# Patient Record
Sex: Female | Born: 1955 | Race: Asian | Hispanic: No | Smoking: Never smoker
Health system: Southern US, Community
[De-identification: ages and names within clinical notes are randomized; demographics above are authoritative.]

## PROBLEM LIST (undated history)

## (undated) DIAGNOSIS — I219 Acute myocardial infarction, unspecified: Secondary | ICD-10-CM

## (undated) DIAGNOSIS — I10 Essential (primary) hypertension: Secondary | ICD-10-CM

## (undated) DIAGNOSIS — D649 Anemia, unspecified: Secondary | ICD-10-CM

## (undated) DIAGNOSIS — E1165 Type 2 diabetes mellitus with hyperglycemia: Secondary | ICD-10-CM

## (undated) DIAGNOSIS — E039 Hypothyroidism, unspecified: Secondary | ICD-10-CM

## (undated) DIAGNOSIS — E785 Hyperlipidemia, unspecified: Secondary | ICD-10-CM

## (undated) DIAGNOSIS — K219 Gastro-esophageal reflux disease without esophagitis: Secondary | ICD-10-CM

## (undated) DIAGNOSIS — IMO0002 Reserved for concepts with insufficient information to code with codable children: Secondary | ICD-10-CM

## (undated) DIAGNOSIS — I2510 Atherosclerotic heart disease of native coronary artery without angina pectoris: Secondary | ICD-10-CM

## (undated) HISTORY — DX: Anemia, unspecified: D64.9

## (undated) HISTORY — DX: Hypothyroidism, unspecified: E03.9

---

## 2009-08-14 DEATH — deceased

## 2013-05-14 ENCOUNTER — Inpatient Hospital Stay (HOSPITAL_COMMUNITY)
Admission: EM | Admit: 2013-05-14 | Discharge: 2013-05-17 | DRG: 247 | Disposition: A | Discharge status: Home / Self Care | Payer: Medicaid Other | Attending: Cardiology | Admitting: Cardiology

## 2013-05-14 ENCOUNTER — Encounter (HOSPITAL_COMMUNITY): Payer: Self-pay | Admitting: Emergency Medicine

## 2013-05-14 ENCOUNTER — Emergency Department (HOSPITAL_COMMUNITY): Payer: Medicaid Other

## 2013-05-14 DIAGNOSIS — E1165 Type 2 diabetes mellitus with hyperglycemia: Secondary | ICD-10-CM

## 2013-05-14 DIAGNOSIS — I2510 Atherosclerotic heart disease of native coronary artery without angina pectoris: Secondary | ICD-10-CM

## 2013-05-14 DIAGNOSIS — I959 Hypotension, unspecified: Secondary | ICD-10-CM | POA: Diagnosis not present

## 2013-05-14 DIAGNOSIS — IMO0002 Reserved for concepts with insufficient information to code with codable children: Secondary | ICD-10-CM

## 2013-05-14 DIAGNOSIS — I10 Essential (primary) hypertension: Secondary | ICD-10-CM | POA: Diagnosis present

## 2013-05-14 DIAGNOSIS — E871 Hypo-osmolality and hyponatremia: Secondary | ICD-10-CM | POA: Diagnosis present

## 2013-05-14 DIAGNOSIS — E785 Hyperlipidemia, unspecified: Secondary | ICD-10-CM

## 2013-05-14 DIAGNOSIS — IMO0001 Reserved for inherently not codable concepts without codable children: Secondary | ICD-10-CM | POA: Diagnosis present

## 2013-05-14 DIAGNOSIS — E119 Type 2 diabetes mellitus without complications: Secondary | ICD-10-CM

## 2013-05-14 DIAGNOSIS — I200 Unstable angina: Secondary | ICD-10-CM | POA: Insufficient documentation

## 2013-05-14 DIAGNOSIS — I2119 ST elevation (STEMI) myocardial infarction involving other coronary artery of inferior wall: Secondary | ICD-10-CM

## 2013-05-14 DIAGNOSIS — I213 ST elevation (STEMI) myocardial infarction of unspecified site: Secondary | ICD-10-CM

## 2013-05-14 DIAGNOSIS — R079 Chest pain, unspecified: Secondary | ICD-10-CM | POA: Insufficient documentation

## 2013-05-14 HISTORY — DX: Atherosclerotic heart disease of native coronary artery without angina pectoris: I25.10

## 2013-05-14 HISTORY — DX: Reserved for concepts with insufficient information to code with codable children: IMO0002

## 2013-05-14 HISTORY — DX: Type 2 diabetes mellitus with hyperglycemia: E11.65

## 2013-05-14 HISTORY — DX: Essential (primary) hypertension: I10

## 2013-05-14 HISTORY — DX: Hyperlipidemia, unspecified: E78.5

## 2013-05-14 LAB — CBC
HCT: 38.1 % (ref 36.0–46.0)
Hemoglobin: 13.5 g/dL (ref 12.0–15.0)
MCH: 29.4 pg (ref 26.0–34.0)
MCHC: 35.4 g/dL (ref 30.0–36.0)
MCV: 83 fL (ref 78.0–100.0)
Platelets: 267 10*3/uL (ref 150–400)
RBC: 4.59 MIL/uL (ref 3.87–5.11)
RDW: 12.3 % (ref 11.5–15.5)
WBC: 7.4 10*3/uL (ref 4.0–10.5)

## 2013-05-14 LAB — BASIC METABOLIC PANEL
CO2: 20 mEq/L (ref 19–32)
Calcium: 9.9 mg/dL (ref 8.4–10.5)
GFR calc non Af Amer: >90 mL/min (ref >90)
Glucose, Bld: 364 mg/dL — ABNORMAL HIGH (ref 70–99)
Sodium: 128 mEq/L — ABNORMAL LOW (ref 135–145)

## 2013-05-14 LAB — POCT I-STAT TROPONIN I: Troponin i, poc: 0.01 ng/mL (ref 0.00–0.08)

## 2013-05-14 MED ORDER — METOPROLOL TARTRATE 25 MG PO TABS
25.0000 mg | ORAL_TABLET | Freq: Two times a day (BID) | ORAL | Status: DC
  Administered 2013-05-14 – 2013-05-15 (×3): 25 mg via ORAL
  Filled 2013-05-14 (×5): qty 1

## 2013-05-14 MED ORDER — ASPIRIN 325 MG PO TABS
325.0000 mg | ORAL_TABLET | ORAL | Status: AC
  Administered 2013-05-14: 325 mg via ORAL
  Filled 2013-05-14: qty 1

## 2013-05-14 MED ORDER — SODIUM CHLORIDE 0.9 % IV SOLN
INTRAVENOUS | Status: DC
  Administered 2013-05-14: 23:00:00 via INTRAVENOUS

## 2013-05-14 MED ORDER — NITROGLYCERIN 2 % TD OINT
1.0000 [in_us] | TOPICAL_OINTMENT | Freq: Once | TRANSDERMAL | Status: AC
  Administered 2013-05-14: 1 [in_us] via TOPICAL
  Filled 2013-05-14: qty 30

## 2013-05-14 MED ORDER — ACETAMINOPHEN 325 MG PO TABS
650.0000 mg | ORAL_TABLET | Freq: Four times a day (QID) | ORAL | Status: DC | PRN
  Administered 2013-05-16: 650 mg via ORAL
  Filled 2013-05-14: qty 2

## 2013-05-14 MED ORDER — ASPIRIN 325 MG PO TABS: 325.0000 mg | ORAL_TABLET | Freq: Every day | ORAL | Status: DC

## 2013-05-14 MED ORDER — PANTOPRAZOLE SODIUM 40 MG PO TBEC
40.0000 mg | DELAYED_RELEASE_TABLET | Freq: Every day | ORAL | Status: DC
  Administered 2013-05-15 – 2013-05-17 (×3): 40 mg via ORAL
  Filled 2013-05-14 (×3): qty 1

## 2013-05-14 MED ORDER — MORPHINE SULFATE 2 MG/ML IJ SOLN
2.0000 mg | INTRAMUSCULAR | Status: DC | PRN
  Administered 2013-05-14: 2 mg via INTRAVENOUS
  Filled 2013-05-14: qty 1

## 2013-05-14 MED ORDER — ONDANSETRON HCL 4 MG/2ML IJ SOLN
4.0000 mg | Freq: Once | INTRAMUSCULAR | Status: DC
  Administered 2013-05-14: 4 mg via INTRAVENOUS
  Filled 2013-05-14: qty 2

## 2013-05-14 MED ORDER — ENOXAPARIN SODIUM 40 MG/0.4ML ~~LOC~~ SOLN
40.0000 mg | Freq: Every day | SUBCUTANEOUS | Status: DC
  Administered 2013-05-14: 40 mg via SUBCUTANEOUS
  Filled 2013-05-14: qty 0.4

## 2013-05-14 MED ORDER — INSULIN ASPART 100 UNIT/ML ~~LOC~~ SOLN
0.0000 [IU] | Freq: Three times a day (TID) | SUBCUTANEOUS | Status: DC
  Administered 2013-05-15: 5 [IU] via SUBCUTANEOUS
  Administered 2013-05-15: 7 [IU] via SUBCUTANEOUS
  Administered 2013-05-15: 3 [IU] via SUBCUTANEOUS
  Administered 2013-05-16: 7 [IU] via SUBCUTANEOUS
  Administered 2013-05-16: 2 [IU] via SUBCUTANEOUS
  Administered 2013-05-16: 5 [IU] via SUBCUTANEOUS
  Administered 2013-05-17 (×2): 3 [IU] via SUBCUTANEOUS

## 2013-05-14 MED ORDER — ONDANSETRON HCL 4 MG/2ML IJ SOLN: 4.0000 mg | Freq: Four times a day (QID) | INTRAMUSCULAR | Status: DC | PRN

## 2013-05-14 MED ORDER — NITROGLYCERIN 0.4 MG SL SUBL
0.4000 mg | SUBLINGUAL_TABLET | SUBLINGUAL | Status: DC | PRN
Start: 2013-05-14 — End: 2013-05-17
  Administered 2013-05-14 (×3): 0.4 mg via SUBLINGUAL
  Filled 2013-05-14 (×2): qty 25

## 2013-05-14 MED ORDER — MORPHINE SULFATE 4 MG/ML IJ SOLN
4.0000 mg | Freq: Once | INTRAMUSCULAR | Status: DC
  Administered 2013-05-14: 4 mg via INTRAVENOUS
  Filled 2013-05-14: qty 1

## 2013-05-14 NOTE — H&P (Addendum)
[facility] History and Physical  [name] [name] ZOX:[id number] DOB: 1955/07/10 DOA: 05/14/2013  Referring physician: EDP PCP: No primary provider on file.   Chief Complaint: chest pain  HPI: [name] [name] is a 57 y.o. female the past medical history of diabetes, hypertension has not seen a medical provider for several years presents to the ER today the above complaint. History is provided by her son, who assists with translation. She apparently had a brief episode of chest pain on the 25th with subsided and then again this evening. They described the chest pain as severe crushing, heaviness radiating to the neck and back of the head, associated with shortness of breath, nausea and vomiting. No fevers or chills no cough congestion. She was given morphine and sublingual nitroglycerin with improvement in her chest pain, this recurred again and was given another dose of the same.     Review of Systems: Positive bolded The patient denies anorexia, fever, weight loss,, vision loss, decreased hearing, hoarseness, chest pain, syncope, dyspnea on exertion, peripheral edema, balance deficits, hemoptysis, abdominal pain, melena, hematochezia, severe indigestion/heartburn, hematuria, incontinence, genital sores, muscle weakness, suspicious skin lesions, transient blindness, difficulty walking, depression, unusual weight change, abnormal bleeding, enlarged lymph nodes, angioedema, and breast masses.    Past Medical History  Diagnosis Date  . Diabetes mellitus without complication   . Hypertension    Past Surgical History  Procedure Laterality Date  . Cesarean section     Social History:  reports that she has never smoked. She has never used smokeless tobacco. She reports that she does not drink alcohol or use illicit drugs. Lives at home with her son and family  No Known Allergies  family history History of heart disease in her parents, she is unclear of her age at the onset of her  disease  Prior to Admission medications   Medication Sig Start Date End Date Taking? Authorizing Provider  dextromethorphan (DELSYM) 30 MG/5ML liquid Take 30 mg by mouth as needed for cough.   Yes Historical Provider, MD  glyBURIDE-metformin (GLUCOVANCE) 2.5-500 MG per tablet Take 1 tablet by mouth at bedtime.   Yes Historical Provider, MD  losartan-hydrochlorothiazide (HYZAAR) 100-12.5 MG per tablet Take 1 tablet by mouth daily.   Yes Historical Provider, MD   Physical Exam: Filed Vitals:   05/14/13 1957  BP: 188/92  Pulse: 92  Temp: 97.6 F (36.4 C)  Resp: 18     General: Alert awake oriented x3 uncomfortable appearing  HEENT: PERRLA, EOMI  CVS S1-S2 regular rate rhythm no murmurs rubs or gallops  Abdomen soft obese nontender nondistended with normal bowel sounds no organomegaly  Extremities no edema clubbing or cyanosis  Skin no rashes or skin breakdown  Psychiatric appropriate mood and affect  Neuro nonfocal   Labs on Admission:  Basic Metabolic Panel:  Recent Labs Lab 05/14/13 2015  NA 128*  K 3.6  CL 92*  CO2 20  GLUCOSE 364*  BUN 11  CREATININE 0.64  CALCIUM 9.9   Liver Function Tests: No results found for this basename: AST, ALT, ALKPHOS, BILITOT, PROT, ALBUMIN,  in the last 168 hours No results found for this basename: LIPASE, AMYLASE,  in the last 168 hours No results found for this basename: AMMONIA,  in the last 168 hours CBC:  Recent Labs Lab 05/14/13 2015  WBC 7.4  HGB 13.5  HCT 38.1  MCV 83.0  PLT 267   Cardiac Enzymes: No results found for this basename: CKTOTAL, CKMB, CKMBINDEX, TROPONINI,  in  the last 168 hours  BNP (last 3 results)  Recent Labs  05/14/13 2015  PROBNP 34.1   CBG: No results found for this basename: GLUCAP,  in the last 168 hours  Radiological Exams on Admission: Dg Chest Port 1 View  05/14/2013   CLINICAL DATA:  Chest pain.  Short of breath.  EXAM: PORTABLE CHEST - 1 VIEW  COMPARISON:  None.   FINDINGS: Upper normal heart size. Lungs are under aerated with bibasilar atelectasis. No pneumothorax.  IMPRESSION: Bibasilar atelectasis.   Electronically Signed   By: Maryclare Bean M.D.   On: 05/14/2013 20:58    EKG: Independently reviewed. NSR, t wave inversion in leads V2-V4  Assessment/Plan  1. Chest pain concerning for unstable angina -EKG with T wave changes in anterior leads, no prior EKG for comparison -Admit to telemetry bed -Aspirin 325 mg daily, nitro paste , add low-dose metoprolol -Cycle cardiac enzymes -Check 2-D echocardiogram -I requested cardiology consult, discussed with Cards fellow Dr.Aitsebaomo -Will likely need cardiac cath Versus stress test,   2. Diabetes mellitus -Hold oral hypoglycemics -Sliding scale insulin -Check HbA1c  3. Hypertension -hold antihypertensives for now  4. Hyponatremia -Likely induced by diuretic, hold HCTZ -Gently hydrate and monitor  DVT prophylaxis: Lovenox   Code Status: Full Code Family Communication: d/w son and son in law at bedside Disposition Plan: inpatient   Time spent:  [name] [name] [facility] Pager [phone]  If 7PM-7AM, please contact night-coverage [email or web address] Password [facility] 05/14/2013, 10:07 PM

## 2013-05-14 NOTE — ED Notes (Signed)
Pt speaks no English, her son-in-law is helping to answer questions. Pt states that she had chest pain on the 25th and then again today. States she has had SOB, dizziness, n/v, and fell forward when the pain initially started at 1845. Pt in NAD. Skin pwd.

## 2013-05-14 NOTE — ED Provider Notes (Signed)
CSN: [id number]     Arrival date & time 05/14/13  1948 History   First MD Initiated Contact with Patient 05/14/13 2011     Chief Complaint  Patient presents with  . Chest Pain   (Consider location/radiation/quality/duration/timing/severity/associated sxs/prior Treatment) HPI Comments: Patient presents to the ER for evaluation of chest pain. Patient had onset of pain approximately 2 hours ago. She was cooking, not exerting herself when the pain began. She feels short of breath with it. Patient also reports that she had pain 4 days ago that was similar. She never told anyone at that time. Patient has no cardiac history, but both her parents had coronary artery disease.  Patient is a 57 y.o. female presenting with chest pain.  Chest Pain Associated symptoms: shortness of breath     Past Medical History  Diagnosis Date  . Diabetes mellitus without complication   . Hypertension    Past Surgical History  Procedure Laterality Date  . Cesarean section     No family history on file. History  Substance Use Topics  . Smoking status: Never Smoker   . Smokeless tobacco: Never Used  . Alcohol Use: No   OB History   Grav Para Term Preterm Abortions TAB SAB Ect Mult Living                 Review of Systems  Respiratory: Positive for shortness of breath.   Cardiovascular: Positive for chest pain.  All other systems reviewed and are negative.    Allergies  Review of patient's allergies indicates no known allergies.  Home Medications  No current outpatient prescriptions on file. BP 188/92  Pulse 92  Temp(Src) 97.6 F (36.4 C) (Oral)  Resp 18  SpO2 100% Physical Exam  Constitutional: She is oriented to person, place, and time. She appears well-developed and well-nourished. No distress.  HENT:  Head: Normocephalic and atraumatic.  Right Ear: Hearing normal.  Left Ear: Hearing normal.  Nose: Nose normal.  Mouth/Throat: Oropharynx is clear and moist and mucous membranes are  normal.  Eyes: Conjunctivae and EOM are normal. Pupils are equal, round, and reactive to light.  Neck: Normal range of motion. Neck supple.  Cardiovascular: Regular rhythm, S1 normal and S2 normal.  Exam reveals no gallop and no friction rub.   No murmur heard. Pulmonary/Chest: Effort normal and breath sounds normal. No respiratory distress. She exhibits no tenderness.  Abdominal: Soft. Normal appearance and bowel sounds are normal. There is no hepatosplenomegaly. There is no tenderness. There is no rebound, no guarding, no tenderness at McBurney's point and negative Murphy's sign. No hernia.  Musculoskeletal: Normal range of motion.  Neurological: She is alert and oriented to person, place, and time. She has normal strength. No cranial nerve deficit or sensory deficit. Coordination normal. GCS eye subscore is 4. GCS verbal subscore is 5. GCS motor subscore is 6.  Skin: Skin is warm, dry and intact. No rash noted. No cyanosis.  Psychiatric: She has a normal mood and affect. Her speech is normal and behavior is normal. Thought content normal.    ED Course  Procedures (including critical care time) Labs Review Labs Reviewed  BASIC METABOLIC PANEL - Abnormal; Notable for the following:    Sodium 128 (*)    Chloride 92 (*)    Glucose, Bld 364 (*)    All other components within normal limits  HEMOGLOBIN A1C - Abnormal; Notable for the following:    Hemoglobin A1C 11.7 (*)    Mean Plasma Glucose  289 (*)    All other components within normal limits  GLUCOSE, CAPILLARY - Abnormal; Notable for the following:    Glucose-Capillary 324 (*)    All other components within normal limits  CBC - Abnormal; Notable for the following:    HCT 35.2 (*)    All other components within normal limits  BASIC METABOLIC PANEL - Abnormal; Notable for the following:    Sodium 131 (*)    Chloride 95 (*)    Glucose, Bld 286 (*)    All other components within normal limits  GLUCOSE, CAPILLARY - Abnormal; Notable  for the following:    Glucose-Capillary 348 (*)    All other components within normal limits  TROPONIN I - Abnormal; Notable for the following:    Troponin I >20.00 (*)    All other components within normal limits  BASIC METABOLIC PANEL - Abnormal; Notable for the following:    Sodium 132 (*)    Potassium 3.3 (*)    Glucose, Bld 339 (*)    All other components within normal limits  GLUCOSE, CAPILLARY - Abnormal; Notable for the following:    Glucose-Capillary 259 (*)    All other components within normal limits  GLUCOSE, CAPILLARY - Abnormal; Notable for the following:    Glucose-Capillary 306 (*)    All other components within normal limits  CBC - Abnormal; Notable for the following:    Hemoglobin 11.9 (*)    HCT 33.8 (*)    All other components within normal limits  GLUCOSE, CAPILLARY - Abnormal; Notable for the following:    Glucose-Capillary 227 (*)    All other components within normal limits  GLUCOSE, CAPILLARY - Abnormal; Notable for the following:    Glucose-Capillary 209 (*)    All other components within normal limits  GLUCOSE, CAPILLARY - Abnormal; Notable for the following:    Glucose-Capillary 185 (*)    All other components within normal limits  BASIC METABOLIC PANEL - Abnormal; Notable for the following:    Glucose, Bld 345 (*)    All other components within normal limits  GLUCOSE, CAPILLARY - Abnormal; Notable for the following:    Glucose-Capillary 347 (*)    All other components within normal limits  MRSA PCR SCREENING  MRSA PCR SCREENING  CBC  PRO B NATRIURETIC PEPTIDE  TROPONIN I  LIPID PANEL  TROPONIN I  TROPONIN I  BASIC METABOLIC PANEL  POCT I-STAT TROPONIN I   Imaging Review Dg Chest Port 1 View  05/14/2013   CLINICAL DATA:  Chest pain.  Short of breath.  EXAM: PORTABLE CHEST - 1 VIEW  COMPARISON:  None.  FINDINGS: Upper normal heart size. Lungs are under aerated with bibasilar atelectasis. No pneumothorax.  IMPRESSION: Bibasilar atelectasis.    Electronically Signed   By: Maryclare Bean M.D.   On: 05/14/2013 20:58    EKG Interpretation    Date/Time:  Saturday May 14 2013 19:57:11 EST Ventricular Rate:  93 PR Interval:    QRS Duration: 65 QT Interval:  342 QTC Calculation: 425 R Axis:   35 Text Interpretation:  Sinus rhythm Borderline low voltage, extremity leads Borderline repolarization abnormality No previous ECGs available Confirmed by POLLINA  MD, CHRISTOPHER ([id number]) on 05/14/2013 8:24:25 PM            MDM  Diagnosis: Chest Pain  Patient presented to the ER with complaints of chest pain. Her pain improved with nitroglycerin. Initial workup is negative, will require further cardiac eval/rule out.  Gilda Crease, MD 05/16/13 [phone]

## 2013-05-15 ENCOUNTER — Encounter (HOSPITAL_COMMUNITY)
Admission: EM | Disposition: A | Discharge status: Home / Self Care | Payer: Self-pay | Source: Home / Self Care | Attending: Cardiology

## 2013-05-15 ENCOUNTER — Ambulatory Visit (HOSPITAL_COMMUNITY): Admission: EM | Admit: 2013-05-15 | Payer: Self-pay | Source: Ambulatory Visit | Admitting: Cardiovascular Disease

## 2013-05-15 DIAGNOSIS — I2510 Atherosclerotic heart disease of native coronary artery without angina pectoris: Secondary | ICD-10-CM

## 2013-05-15 DIAGNOSIS — I2119 ST elevation (STEMI) myocardial infarction involving other coronary artery of inferior wall: Secondary | ICD-10-CM

## 2013-05-15 DIAGNOSIS — I059 Rheumatic mitral valve disease, unspecified: Secondary | ICD-10-CM

## 2013-05-15 HISTORY — PX: PERCUTANEOUS STENT INTERVENTION: SHX5500

## 2013-05-15 HISTORY — PX: LEFT HEART CATHETERIZATION WITH CORONARY ANGIOGRAM: SHX5451

## 2013-05-15 HISTORY — PX: LEFT HEART CATH: SHX5946

## 2013-05-15 LAB — BASIC METABOLIC PANEL
BUN: 9 mg/dL (ref 6–23)
BUN: 8 mg/dL (ref 6–23)
CO2: 21 mEq/L (ref 19–32)
CO2: 24 mEq/L (ref 19–32)
Calcium: 8.4 mg/dL (ref 8.4–10.5)
Chloride: 95 mEq/L — ABNORMAL LOW (ref 96–112)
Creatinine, Ser: 0.59 mg/dL (ref 0.50–1.10)
GFR calc Af Amer: >90 mL/min (ref >90)
GFR calc non Af Amer: >90 mL/min (ref >90)
GFR calc non Af Amer: >90 mL/min (ref >90)
Glucose, Bld: 286 mg/dL — ABNORMAL HIGH (ref 70–99)
Glucose, Bld: 339 mg/dL — ABNORMAL HIGH (ref 70–99)
Potassium: 4.2 mEq/L (ref 3.5–5.1)
Potassium: 3.3 mEq/L — ABNORMAL LOW (ref 3.5–5.1)
Sodium: 131 mEq/L — ABNORMAL LOW (ref 135–145)

## 2013-05-15 LAB — CBC
HCT: 35.2 % — ABNORMAL LOW (ref 36.0–46.0)
Hemoglobin: 12 g/dL (ref 12.0–15.0)
MCH: 28.4 pg (ref 26.0–34.0)
MCV: 83.2 fL (ref 78.0–100.0)
Platelets: 249 10*3/uL (ref 150–400)
RBC: 4.23 MIL/uL (ref 3.87–5.11)
WBC: 8.4 10*3/uL (ref 4.0–10.5)

## 2013-05-15 LAB — MRSA PCR SCREENING
MRSA by PCR: NEGATIVE
MRSA by PCR: NEGATIVE

## 2013-05-15 LAB — HEMOGLOBIN A1C
Hgb A1c MFr Bld: 11.7 % — ABNORMAL HIGH (ref <5.7)
Mean Plasma Glucose: 289 mg/dL — ABNORMAL HIGH (ref <117)

## 2013-05-15 LAB — TROPONIN I: Troponin I: <0.3 ng/mL

## 2013-05-15 LAB — GLUCOSE, CAPILLARY
Glucose-Capillary: 324 mg/dL — ABNORMAL HIGH (ref 70–99)
Glucose-Capillary: 348 mg/dL — ABNORMAL HIGH (ref 70–99)

## 2013-05-15 SURGERY — LEFT HEART CATHETERIZATION WITH CORONARY ANGIOGRAM

## 2013-05-15 MED ORDER — HEPARIN (PORCINE) IN NACL 2-0.9 UNIT/ML-% IJ SOLN
INTRAMUSCULAR | Status: AC
  Filled 2013-05-15: qty 1000

## 2013-05-15 MED ORDER — HEPARIN (PORCINE) IN NACL 100-0.45 UNIT/ML-% IJ SOLN
800.0000 [IU]/h | INTRAMUSCULAR | Status: DC
  Administered 2013-05-15: 800 [IU]/h via INTRAVENOUS
  Filled 2013-05-15: qty 250

## 2013-05-15 MED ORDER — VERAPAMIL HCL 2.5 MG/ML IV SOLN
INTRAVENOUS | Status: AC
  Filled 2013-05-15: qty 2

## 2013-05-15 MED ORDER — TICAGRELOR 90 MG PO TABS
ORAL_TABLET | ORAL | Status: AC
  Filled 2013-05-15: qty 2

## 2013-05-15 MED ORDER — POTASSIUM CHLORIDE CRYS ER 10 MEQ PO TBCR
EXTENDED_RELEASE_TABLET | ORAL | Status: AC
  Filled 2013-05-15: qty 4

## 2013-05-15 MED ORDER — LISINOPRIL 2.5 MG PO TABS
2.5000 mg | ORAL_TABLET | Freq: Every day | ORAL | Status: DC
  Administered 2013-05-15: 2.5 mg via ORAL
  Filled 2013-05-15 (×4): qty 1

## 2013-05-15 MED ORDER — SODIUM CHLORIDE 0.9 % IV SOLN
0.2500 mg/kg/h | INTRAVENOUS | Status: DC
  Administered 2013-05-15: 0.25 mg/kg/h via INTRAVENOUS

## 2013-05-15 MED ORDER — INFLUENZA VAC SPLIT QUAD 0.5 ML IM SUSP
0.5000 mL | INTRAMUSCULAR | Status: AC
  Administered 2013-05-16: 0.5 mL via INTRAMUSCULAR
  Filled 2013-05-15: qty 0.5

## 2013-05-15 MED ORDER — ATORVASTATIN CALCIUM 80 MG PO TABS
80.0000 mg | ORAL_TABLET | Freq: Every day | ORAL | Status: DC
  Administered 2013-05-15 – 2013-05-16 (×2): 80 mg via ORAL
  Filled 2013-05-15 (×3): qty 1

## 2013-05-15 MED ORDER — NITROGLYCERIN 0.2 MG/ML ON CALL CATH LAB
INTRAVENOUS | Status: AC
  Filled 2013-05-15: qty 1

## 2013-05-15 MED ORDER — LIDOCAINE HCL (PF) 1 % IJ SOLN
INTRAMUSCULAR | Status: AC
  Filled 2013-05-15: qty 30

## 2013-05-15 MED ORDER — INSULIN ASPART 100 UNIT/ML ~~LOC~~ SOLN
9.0000 [IU] | Freq: Once | SUBCUTANEOUS | Status: AC
  Administered 2013-05-15: 9 [IU] via SUBCUTANEOUS

## 2013-05-15 MED ORDER — PNEUMOCOCCAL VAC POLYVALENT 25 MCG/0.5ML IJ INJ
0.5000 mL | INJECTION | INTRAMUSCULAR | Status: AC
  Administered 2013-05-16: 0.5 mL via INTRAMUSCULAR
  Filled 2013-05-15: qty 0.5

## 2013-05-15 MED ORDER — SODIUM CHLORIDE 0.9 % IV SOLN
INTRAVENOUS | Status: AC
  Administered 2013-05-15 (×2): via INTRAVENOUS

## 2013-05-15 MED ORDER — NITROGLYCERIN IN D5W 200-5 MCG/ML-% IV SOLN
INTRAVENOUS | Status: AC
  Administered 2013-05-15: 50000 ug
  Filled 2013-05-15: qty 250

## 2013-05-15 MED ORDER — POTASSIUM CHLORIDE CRYS ER 20 MEQ PO TBCR
40.0000 meq | EXTENDED_RELEASE_TABLET | Freq: Once | ORAL | Status: AC
  Administered 2013-05-15: 40 meq via ORAL
  Filled 2013-05-15: qty 2

## 2013-05-15 MED ORDER — ASPIRIN 81 MG PO CHEW
81.0000 mg | CHEWABLE_TABLET | Freq: Every day | ORAL | Status: DC
  Administered 2013-05-15 – 2013-05-17 (×3): 81 mg via ORAL
  Filled 2013-05-15 (×3): qty 1

## 2013-05-15 MED ORDER — TICAGRELOR 90 MG PO TABS
90.0000 mg | ORAL_TABLET | Freq: Two times a day (BID) | ORAL | Status: DC
  Administered 2013-05-15 – 2013-05-17 (×5): 90 mg via ORAL
  Filled 2013-05-15 (×6): qty 1

## 2013-05-15 MED ORDER — BIVALIRUDIN 250 MG IV SOLR
INTRAVENOUS | Status: AC
  Filled 2013-05-15: qty 250

## 2013-05-15 MED ORDER — MORPHINE SULFATE 2 MG/ML IJ SOLN
2.0000 mg | INTRAMUSCULAR | Status: DC | PRN
  Filled 2013-05-15: qty 1

## 2013-05-15 MED ORDER — NITROGLYCERIN IN D5W 200-5 MCG/ML-% IV SOLN: 2.0000 ug/min | INTRAVENOUS | Status: DC

## 2013-05-15 MED ORDER — GLIPIZIDE 5 MG PO TABS
5.0000 mg | ORAL_TABLET | Freq: Two times a day (BID) | ORAL | Status: DC
  Administered 2013-05-15 (×2): 5 mg via ORAL
  Filled 2013-05-15 (×5): qty 1

## 2013-05-15 NOTE — Progress Notes (Signed)
Pt arrived to ICU room 1226 at 2303, denied Chest pain at original assessment. Lajuana Matte RN assumed care upon pt arrival. At 2330 pt complained of chest pain 9/10 pain, SL Nitro given per RN as well as Morphine 2 mg IVP. Pain remained uncontrolled. Cardiology consulted per Dr Jomarie Longs, however Cardiology MD had not seen pt yet. Dr. Jomarie Longs paged at 2340, Benedetto Coons NP paged at 2355. Cardiology Dr Katha Cabal at bedside 2400 to assess pt.

## 2013-05-15 NOTE — ED Provider Notes (Signed)
CSN: [id number]     Arrival date & time 05/14/13  1948 History   First MD Initiated Contact with Patient 05/14/13 2011     Chief Complaint  Patient presents with  . Chest Pain   (Consider location/radiation/quality/duration/timing/severity/associated sxs/prior Treatment) Patient is a 57 y.o. female presenting with chest pain. The history is provided by the EMS personnel.  Chest Pain Pain location:  Unable to specify Pain quality: pressure   Pain severity:  Mild Onset quality:  Gradual Timing:  Constant Progression:  Improving Chronicity:  New Relieved by:  Nitroglycerin Worsened by:  Nothing tried Ineffective treatments:  None tried Associated symptoms: nausea and vomiting   Associated symptoms: no abdominal pain, no back pain, no cough, no dizziness, no fatigue, no fever, no headache and no shortness of breath     Past Medical History  Diagnosis Date  . Diabetes mellitus without complication   . Hypertension    Past Surgical History  Procedure Laterality Date  . Cesarean section     No family history on file. History  Substance Use Topics  . Smoking status: Never Smoker   . Smokeless tobacco: Never Used  . Alcohol Use: No   OB History   Grav Para Term Preterm Abortions TAB SAB Ect Mult Living                 Review of Systems  Constitutional: Negative for fever and fatigue.  HENT: Negative for congestion and drooling.   Eyes: Negative for pain.  Respiratory: Negative for cough and shortness of breath.   Cardiovascular: Positive for chest pain.  Gastrointestinal: Positive for nausea and vomiting. Negative for abdominal pain and diarrhea.  Genitourinary: Negative for dysuria and hematuria.  Musculoskeletal: Negative for back pain, gait problem and neck pain.  Skin: Negative for color change.  Neurological: Negative for dizziness and headaches.  Hematological: Negative for adenopathy.  Psychiatric/Behavioral: Negative for behavioral problems.  All other  systems reviewed and are negative.    Allergies  Review of patient's allergies indicates no known allergies.  Home Medications   Current Outpatient Rx  Name  Route  Sig  Dispense  Refill  . dextromethorphan (DELSYM) 30 MG/5ML liquid   Oral   Take 30 mg by mouth as needed for cough.         . glyBURIDE-metformin (GLUCOVANCE) 2.5-500 MG per tablet   Oral   Take 1 tablet by mouth at bedtime.         Marland Kitchen losartan-hydrochlorothiazide (HYZAAR) 100-12.5 MG per tablet   Oral   Take 1 tablet by mouth daily.          BP 132/84  Pulse 91  Temp(Src) 97.5 F (36.4 C) (Oral)  Resp 15  Ht 4\' 10"  (1.473 m)  Wt 145 lb 11.6 oz (66.1 kg)  BMI 30.46 kg/m2  SpO2 100% Physical Exam  Nursing note and vitals reviewed. Constitutional: She is oriented to person, place, and time. She appears well-developed and well-nourished.  HENT:  Head: Normocephalic.  Mouth/Throat: Oropharynx is clear and moist. No oropharyngeal exudate.  Eyes: Conjunctivae and EOM are normal. Pupils are equal, round, and reactive to light.  Neck: Normal range of motion. Neck supple.  Cardiovascular: Normal rate, regular rhythm, normal heart sounds and intact distal pulses.  Exam reveals no gallop and no friction rub.   No murmur heard. Pulmonary/Chest: Effort normal and breath sounds normal. No respiratory distress. She has no wheezes.  Abdominal: Soft. Bowel sounds are normal. There is no tenderness.  There is no rebound and no guarding.  Musculoskeletal: Normal range of motion. She exhibits no edema and no tenderness.  Neurological: She is alert and oriented to person, place, and time.  Skin: Skin is warm and dry.  Psychiatric: She has a normal mood and affect. Her behavior is normal.    ED Course  Procedures (including critical care time) Labs Review Labs Reviewed  BASIC METABOLIC PANEL - Abnormal; Notable for the following:    Sodium 128 (*)    Chloride 92 (*)    Glucose, Bld 364 (*)    All other  components within normal limits  GLUCOSE, CAPILLARY - Abnormal; Notable for the following:    Glucose-Capillary 324 (*)    All other components within normal limits  MRSA PCR SCREENING  CBC  PRO B NATRIURETIC PEPTIDE  TROPONIN I  LIPID PANEL  TROPONIN I  TROPONIN I  BASIC METABOLIC PANEL  HEMOGLOBIN A1C  HEPARIN LEVEL (UNFRACTIONATED)  POCT I-STAT TROPONIN I   Imaging Review Dg Chest Port 1 View  05/14/2013   CLINICAL DATA:  Chest pain.  Short of breath.  EXAM: PORTABLE CHEST - 1 VIEW  COMPARISON:  None.  FINDINGS: Upper normal heart size. Lungs are under aerated with bibasilar atelectasis. No pneumothorax.  IMPRESSION: Bibasilar atelectasis.   Electronically Signed   By: Maryclare Bean M.D.   On: 05/14/2013 20:58    EKG Interpretation    Date/Time:  Saturday May 14 2013 19:57:11 EST Ventricular Rate:  93 PR Interval:    QRS Duration: 65 QT Interval:  342 QTC Calculation: 425 R Axis:   35 Text Interpretation:  Sinus rhythm Borderline low voltage, extremity leads Borderline repolarization abnormality No previous ECGs available Confirmed by POLLINA  MD, CHRISTOPHER ([id number]) on 05/14/2013 8:24:25 PM            MDM   1. Diabetes mellitus   2. HTN (hypertension)   3. Unstable angina   4. ST elevation myocardial infarction (STEMI) of inferior wall   5. STEMI (ST elevation myocardial infarction)    1:33 AM 57 y.o. female who presents with chest pain. Per report the patient began having chest pain at approximately 7 to 8 PM this evening and was seen at [facility] long. She was admitted to [facility] long ICU. Per EMS she was having nausea, vomiting, and worsening chest pain. She developed acute EKG changes with inferior ST elevation. She got a heparin bolus and has been transferred to [facility] to have an emergent cath. She is currently stable and will be closely monitored in the emergency department while we wait on the cath team to arrive.    Junius Argyle, MD 05/16/13  [phone]

## 2013-05-15 NOTE — Progress Notes (Signed)
Chaplain paged to code stemi. Patient taken to Cath Lab. Chaplain provided pastoral care and support to the patient's family members.   05/15/13 0107  Clinical Encounter Type  Visited With Family  Visit Type Initial;Code  Referral From Nurse

## 2013-05-15 NOTE — Progress Notes (Signed)
Utilization Review Completed.[name] [name] T11/30/2014  

## 2013-05-15 NOTE — Consult Note (Signed)
Reason for Consult: chest Pain  Referring Physician: Dr. Zannie Cove  [name] [name] is an 57 y.o. female who is currently seen in consultation with Dr. Zannie Cove for evaluation of chest pain. History was obtained from patient's son who speak fluent english  HPI: [name] [name] is a 57 y/o [place] female with a PMI of HTN and DM and presented with chest pain that started while she was cooking about 2 hours prior to presentation.  Her chest pain was described as a pressure pain, 9/10 in severity, associated with shortness of breath, diaphoresis and radiating to her neck. She has no history of tobacco use and no prior history of CAD or family history of premature CAD. She originally presented to [facility] where her initial cardiac markers were negative. EKG obtained 05/14/2013 at 19:57 showed sinus rhythm with heart rate of 93 bpm and T-wave inversion in leads V2-V5 (no old EKG for comparison).  She was initially admitted to [facility] ICU and started on Nitropaste and prn Morphine prior to obtaining a cardiology consult.   On my arrival to [facility] for cardiology consult, patient was complaining of 9/10 chest pain, and she appeared to be in distress. She was then started on Nitroglycerin drip 5mg /hr and titrated up to 10mg /hr, and she was started on Heparin 3000 unit bolus followed by a drip. Since she appeared to have ST-elevation on telemetry monitor at that time, a 12-lead EKG was obtained which confirmed presence of inferior St-elevation.  Dr. Kirke Corin (on call interventionalist) was notified and Carelink was activated and patient was emergently transferred to [facility] cardiac catheterization lab for a diagnostic procedure.  Past Medical History  Diagnosis Date  . Diabetes mellitus without complication   . Hypertension     Past Surgical History  Procedure Laterality Date  . Cesarean section      No family history on file.  Social History:  reports that she has  never smoked. She has never used smokeless tobacco. She reports that she does not drink alcohol or use illicit drugs.  Allergies: No Known Allergies  Medications: Noted  Results for orders placed during the hospital encounter of 05/14/13 (from the past 48 hour(s))  CBC     Status: None   Collection Time    05/14/13  8:15 PM      Result Value Range   WBC 7.4  4.0 - 10.5 K/uL   RBC 4.59  3.87 - 5.11 MIL/uL   Hemoglobin 13.5  12.0 - 15.0 g/dL   HCT 47.8  29.5 - 62.1 %   MCV 83.0  78.0 - 100.0 fL   MCH 29.4  26.0 - 34.0 pg   MCHC 35.4  30.0 - 36.0 g/dL   RDW 30.8  65.7 - 84.6 %   Platelets 267  150 - 400 K/uL  BASIC METABOLIC PANEL     Status: Abnormal   Collection Time    05/14/13  8:15 PM      Result Value Range   Sodium 128 (*) 135 - 145 mEq/L   Potassium 3.6  3.5 - 5.1 mEq/L   Chloride 92 (*) 96 - 112 mEq/L   CO2 20  19 - 32 mEq/L   Glucose, Bld 364 (*) 70 - 99 mg/dL   BUN 11  6 - 23 mg/dL   Creatinine, Ser 9.62  0.50 - 1.10 mg/dL   Calcium 9.9  8.4 - 95.2 mg/dL   GFR calc non Af Amer >90  >90  mL/min   GFR calc Af Amer >90  >90 mL/min   Comment: (NOTE)     The eGFR has been calculated using the CKD EPI equation.     This calculation has not been validated in all clinical situations.     eGFR's persistently <90 mL/min signify possible Chronic Kidney     Disease.  PRO B NATRIURETIC PEPTIDE     Status: None   Collection Time    05/14/13  8:15 PM      Result Value Range   Pro B Natriuretic peptide (BNP) 34.1  0 - 125 pg/mL  POCT I-STAT TROPONIN I     Status: None   Collection Time    05/14/13  8:20 PM      Result Value Range   Troponin i, poc 0.01  0.00 - 0.08 ng/mL   Comment 3            Comment: Due to the release kinetics of cTnI,     a negative result within the first hours     of the onset of symptoms does not rule out     myocardial infarction with certainty.     If myocardial infarction is still suspected,     repeat the test at appropriate intervals.   TROPONIN I     Status: None   Collection Time    05/14/13 11:36 PM      Result Value Range   Troponin I <0.30  <0.30 ng/mL   Comment:            Due to the release kinetics of cTnI,     a negative result within the first hours     of the onset of symptoms does not rule out     myocardial infarction with certainty.     If myocardial infarction is still suspected,     repeat the test at appropriate intervals.  GLUCOSE, CAPILLARY     Status: Abnormal   Collection Time    05/15/13 12:07 AM      Result Value Range   Glucose-Capillary 324 (*) 70 - 99 mg/dL   Comment 1 Notify RN      Dg Chest Port 1 View  05/14/2013   CLINICAL DATA:  Chest pain.  Short of breath.  EXAM: PORTABLE CHEST - 1 VIEW  COMPARISON:  None.  FINDINGS: Upper normal heart size. Lungs are under aerated with bibasilar atelectasis. No pneumothorax.  IMPRESSION: Bibasilar atelectasis.   Electronically Signed   By: Maryclare Bean M.D.   On: 05/14/2013 20:58    Review of Systems  Constitutional: Positive for diaphoresis. Negative for fever, chills, weight loss and malaise/fatigue.  HENT: Negative for congestion, ear discharge, ear pain, hearing loss, nosebleeds and tinnitus.   Eyes: Negative for blurred vision, double vision, photophobia and pain.  Respiratory: Positive for shortness of breath. Negative for cough, hemoptysis, sputum production, wheezing and stridor.   Cardiovascular: Positive for chest pain. Negative for palpitations, orthopnea, claudication, leg swelling and PND.  Gastrointestinal: Positive for nausea. Negative for heartburn, vomiting, abdominal pain, diarrhea, constipation, blood in stool and melena.  Genitourinary: Negative for dysuria, urgency, frequency, hematuria and flank pain.  Musculoskeletal: Negative for back pain, joint pain, myalgias and neck pain.  Skin: Negative for itching and rash.  Neurological: Negative for dizziness, tingling, tremors, sensory change, speech change, focal weakness,  seizures, loss of consciousness, weakness and headaches.  Psychiatric/Behavioral: Negative for depression and suicidal ideas.   Blood pressure 136/86, pulse 80,  temperature 97.5 F (36.4 C), temperature source Oral, resp. rate 16, height 4\' 10"  (1.473 m), weight 66.1 kg (145 lb 11.6 oz), SpO2 100.00%. Physical Exam  Constitutional: She is oriented to person, place, and time. She appears well-developed and well-nourished. She appears distressed.  HENT:  Head: Normocephalic and atraumatic.  Eyes: EOM are normal. Pupils are equal, round, and reactive to light. Right eye exhibits no discharge. Left eye exhibits no discharge. No scleral icterus.  Neck: Normal range of motion. Neck supple. No JVD present. No tracheal deviation present. No thyromegaly present.  Cardiovascular: Normal rate, regular rhythm, normal heart sounds and intact distal pulses.  Exam reveals no gallop and no friction rub.   No murmur heard. Respiratory: Effort normal and breath sounds normal. No stridor. No respiratory distress. She has no wheezes. She has no rales. She exhibits no tenderness.  GI: Soft. Bowel sounds are normal. She exhibits no distension. There is no tenderness. There is no rebound and no guarding.  Musculoskeletal: Normal range of motion. She exhibits no edema and no tenderness.  Neurological: She is alert and oriented to person, place, and time. No cranial nerve deficit. Coordination normal.  Skin: No rash noted. She is diaphoretic. No erythema.  Psychiatric: She has a normal mood and affect.    Assessment/Plan:  Chest pain with inferior ST-elevation DM HTN  Patient was transferred emergently from [facility] to [facility] cardiac catheterization lab for a diagnostic procedure due to persistent chest pain (not responding to nitroglycerine) and inferior ST-elevation seen on EKG. Possibilities include CAD vz coronary artery spasms. She will admitted to cardiac ICU at [facility] after her procedure  with appropriate medication depending on the findings. If she is found to have CAD and she undergoes intervention, we will start her on dual anti-platelet medication and Statin. We will obtain a TTE to evaluate her LV function in the morning. We will treat her HTN and diabetes with her home medication.  Aylene Acoff E 05/15/2013, 12:42 AM

## 2013-05-15 NOTE — Progress Notes (Signed)
Received pt from previous RN about 40 min ago (events up til present time) , pt c/o of CP, nausea, and diaphoretic as I entered her room.  MD at pt bedside as well.  As well as pt son, who is translating for her as she speaks very little [place].  MD explaining events to pt and addressing pt concerns.  Nitro infusing per [facility].  Heparin gtt started per MD orders and bolus given, also.  EKG obtained, given to MD.  Care link called to transport pt to [facility] per MD orders.  Pt pain free and no nausea at this time.  Care Link presently loading pt up for transport to [facility].

## 2013-05-15 NOTE — Progress Notes (Signed)
ANTICOAGULATION CONSULT NOTE - Initial Consult  Pharmacy Consult for Heparin Indication: chest pain/ACS/USA  No Known Allergies  Patient Measurements: Height: 4\' 10"  (147.3 cm) Weight: 145 lb 11.6 oz (66.1 kg) IBW/kg (Calculated) : 40.9 Heparin Dosing Weight:   Vital Signs: Temp: 97.5 F (36.4 C) (11/30 0000) Temp src: Oral (11/30 0000) BP: 136/86 mmHg (11/30 0000) Pulse Rate: 80 (11/30 0000)  Labs:  Recent Labs  05/14/13 2015 05/14/13 2336  HGB 13.5  --   HCT 38.1  --   PLT 267  --   CREATININE 0.64  --   TROPONINI  --  <0.30    Estimated Creatinine Clearance: 61 ml/min (by C-G formula based on Cr of 0.64).   Medical History: Past Medical History  Diagnosis Date  . Diabetes mellitus without complication   . Hypertension     Medications:  Infusions:  . sodium chloride 75 mL/hr at 05/14/13 2322  . heparin 800 Units/hr (05/15/13 0055)  . nitroGLYCERIN      Assessment: Patient with [place].  Lovenox 40mg  sq x1 given shortly before drip to start.  Goal of Therapy:  Heparin level 0.3-0.7 units/ml Monitor platelets by anticoagulation protocol: Yes   Plan:  No bolus due to lovenox. Heparin drip at 800 units/hr Daily CBC/Heparin level Next heparin level at 0800am  Darlina Guys, Jacquenette Shone Crowford 05/15/2013,1:00 AM

## 2013-05-15 NOTE — Progress Notes (Signed)
Patient ID: [name] [name], female   DOB: 08/22/1955, 57 y.o.   MRN: [id number]   SUBJECTIVE: Son interprets for patient.  No further chest pain after PCI to mid LCx last night.  ECG after intervention showed resolution of ST elevation.   Marland Kitchen aspirin  81 mg Oral Daily  . atorvastatin  80 mg Oral q1800  . insulin aspart  0-9 Units Subcutaneous TID WC  . lisinopril  2.5 mg Oral Daily  . metoprolol tartrate  25 mg Oral BID  . pantoprazole  40 mg Oral Q1200  . [START ON 05/16/2013] pneumococcal 23 valent vaccine  0.5 mL Intramuscular Tomorrow-1000  . Ticagrelor  90 mg Oral BID     Filed Vitals:   05/15/13 0500 05/15/13 0530 05/15/13 0600 05/15/13 0630  BP: 110/58 111/61 109/51 108/46  Pulse: 87 77 84 83  Temp:      TempSrc:      Resp: 16 16 18 16   Height:      Weight:      SpO2: 100% 100% 100% 100%    Intake/Output Summary (Last 24 hours) at 05/15/13 0750 Last data filed at 05/15/13 0630  Gross per 24 hour  Intake 395.25 ml  Output    700 ml  Net -304.75 ml    LABS: Basic Metabolic Panel:  Recent Labs  14/78/29 2015 05/15/13 0552  NA 128* 131*  K 3.6 4.2  CL 92* 95*  CO2 20 21  GLUCOSE 364* 286*  BUN 11 9  CREATININE 0.64 0.54  CALCIUM 9.9 8.9   Liver Function Tests: No results found for this basename: AST, ALT, ALKPHOS, BILITOT, PROT, ALBUMIN,  in the last 72 hours No results found for this basename: LIPASE, AMYLASE,  in the last 72 hours CBC:  Recent Labs  05/14/13 2015 05/15/13 0552  WBC 7.4 8.4  HGB 13.5 12.0  HCT 38.1 35.2*  MCV 83.0 83.2  PLT 267 249   Cardiac Enzymes:  Recent Labs  05/14/13 2336  TROPONINI <0.30   BNP: No components found with this basename: POCBNP,  D-Dimer: No results found for this basename: DDIMER,  in the last 72 hours Hemoglobin A1C: No results found for this basename: HGBA1C,  in the last 72 hours Fasting Lipid Panel: No results found for this basename: CHOL, HDL, LDLCALC, TRIG, CHOLHDL, LDLDIRECT,  in the last  72 hours Thyroid Function Tests: No results found for this basename: TSH, T4TOTAL, FREET3, T3FREE, THYROIDAB,  in the last 72 hours Anemia Panel: No results found for this basename: VITAMINB12, FOLATE, FERRITIN, TIBC, IRON, RETICCTPCT,  in the last 72 hours  RADIOLOGY: Dg Chest Port 1 View  05/14/2013   CLINICAL DATA:  Chest pain.  Short of breath.  EXAM: PORTABLE CHEST - 1 VIEW  COMPARISON:  None.  FINDINGS: Upper normal heart size. Lungs are under aerated with bibasilar atelectasis. No pneumothorax.  IMPRESSION: Bibasilar atelectasis.   Electronically Signed   By: Maryclare Bean M.D.   On: 05/14/2013 20:58    PHYSICAL EXAM General: NAD Neck: No JVD, no thyromegaly or thyroid nodule.  Lungs: Clear to auscultation bilaterally with normal respiratory effort. CV: Nondisplaced PMI.  Heart regular S1/S2, no S3/S4, no murmur.  No peripheral edema.  No carotid bruit.  Normal pedal pulses.  Abdomen: Soft, nontender, no hepatosplenomegaly, no distention.  Neurologic: Alert and oriented x 3.  Psych: Normal affect. Extremities: No clubbing or cyanosis.   TELEMETRY: Reviewed telemetry pt in NSR  ASSESSMENT AND PLAN: 57 yo with history  of HTN and DM presented with inferior STEMI, now s/p PCI to LCx.  1. CAD: s/p PCI to mid LCx with DES in setting of inferior STEMI.  ST segment elevation resolved on ECG done after PCI.  No further chest pain.  - Continue ASA 81, Brilinta, statin.   - Continue metoprolol - Add lisinopril 2.5 mg daily - Echo today.  - Ambulate with cardiac rehab.  - Would be candidate for early discharge (tomorrow) if she continues to do well.  2. Type II DM: Will add glipizide 5 mg bid to regimen (blood glucose running high today).   Marca Ancona 05/15/2013 7:55 AM

## 2013-05-15 NOTE — CV Procedure (Signed)
Cardiac Catheterization Procedure Note  Name: [name] [name] MRN: [id number] DOB: 04-09-1956  Procedure: Left Heart Cath, Selective Coronary Angiography, LV angiography, PTCA and stenting of the mid left circumflex artery  Indication: Inferior ST elevation myocardial infarction. She presented to [facility] with unstable angina. Initial EKG did not show any significant ST changes. Subsequent EKG showed inferior ST elevation and thus she was transferred for emergent left heart catheterization and possible PCI. She has known history of diabetes and hypertension.  Medications:    Contrast:  125 ml Omnipaque  Procedural Details: The right wrist was prepped, draped, and anesthetized with 1% lidocaine. Using the modified Seldinger technique, a 5 French Slender sheath was introduced into the right radial artery. 3 mg of verapamil was administered through the sheath, weight-based unfractionated heparin was administered intravenously. A Jackie catheter was used for selective coronary angiography. A pigtail catheter was used for left ventriculography. Catheter exchanges were performed over an exchange length guidewire. There were no immediate procedural complications.  Procedural Findings:  Hemodynamics: AO:  110/70   mmHg LV:  112 /6    mmHg LVEDP: 15  mmHg  Coronary angiography: Coronary dominance: Right   Left Main:  Normal   Left Anterior Descending (LAD):  Normal in size with minor irregularities.  1st diagonal (D1):  Normal in size with no significant disease.  2nd diagonal (D2):  Medium in size with no significant disease.  3rd diagonal (D3):  Small in size with no significant disease.  Circumflex (LCx):  Normal in size with minor irregularities proximally. There is a 99% hazy stenosis in the midsegment which is likely the culprit for inferior ST elevation MI. The stenosis is at the origin of OM 2.  1st obtuse marginal:  Small in size with minor irregularities.  2nd  obtuse marginal:  Normal in size with minor irregularities.  3rd obtuse marginal:  Normal in size with no significant disease.   Right Coronary Artery: normal in size with no significant disease.  Posterior descending artery: Normal  Posterior AV segment: Small in size with no significant disease.  Posterolateral branchs:  Small and normal posterolateral branches.  Left ventriculography: Left ventricular systolic function is normal  , LVEF is estimated at 55 %, there is no significant mitral regurgitation   PCI Note:  Following the diagnostic procedure, the decision was made to proceed with PCI.  Weight-based bivalirudin was given for anticoagulation. Once a therapeutic ACT was achieved, a 6 [place] XB 3.0 guide catheter was inserted.  A intuition coronary guidewire was used to cross the lesion.  The lesion was predilated with a 2.5 x 12 balloon.  The lesion was then stented with a 2.5 x 18 mm Xience expedition drug-eluting stent. There was a significant vessel mismatch between the proximal and the distal segment of the stent. The stent was postdilated with a 3.0 x 12 noncompliant balloon.  Following PCI, there was 0% residual stenosis and TIMI-3 flow. Final angiography confirmed an excellent result. The patient tolerated the procedure well. There were no immediate procedural complications. A TR band was used for radial hemostasis. The patient was transferred to the post catheterization recovery area for further monitoring.  PCI Data: Vessel - left circumflex artery/Segment - mid Percent Stenosis (pre)  99% TIMI-flow 3 Stent : 2.5 x 18 mm Xience expedition drug-eluting stent postdilated with a 3.0 noncompliant balloon in the mid and proximal segment Percent Stenosis (post) 0% TIMI-flow (post) 3   Final Conclusions:  1. Severe one-vessel coronary artery  disease with 99% hazy mid left circumflex stenosis which is the culprit for inferior ST elevation MI. 2. Normal LV systolic function and  mildly elevated left ventricular end-diastolic pressure. 3. Successful angioplasty and drug-eluting stent placement to the left circumflex artery.  Recommendations:  Continue dual antiplatelet therapy for at least one year. Aggressive treatment of risk factors is recommended.  Lorine Bears MD, [facility] 05/15/2013, 2:51 AM

## 2013-05-15 NOTE — Progress Notes (Signed)
*  PRELIMINARY RESULTS* Echocardiogram 2D Echocardiogram has been performed.  [name] [name] 05/15/2013, 10:21 AM

## 2013-05-16 DIAGNOSIS — I213 ST elevation (STEMI) myocardial infarction of unspecified site: Secondary | ICD-10-CM | POA: Insufficient documentation

## 2013-05-16 LAB — BASIC METABOLIC PANEL
BUN: 12 mg/dL (ref 6–23)
CO2: 25 mEq/L (ref 19–32)
Chloride: 100 mEq/L (ref 96–112)
Creatinine, Ser: 0.69 mg/dL (ref 0.50–1.10)
Glucose, Bld: 345 mg/dL — ABNORMAL HIGH (ref 70–99)
Potassium: 3.9 mEq/L (ref 3.5–5.1)
Sodium: 135 mEq/L (ref 135–145)

## 2013-05-16 LAB — GLUCOSE, CAPILLARY: Glucose-Capillary: 220 mg/dL — ABNORMAL HIGH (ref 70–99)

## 2013-05-16 LAB — CBC
HCT: 33.8 % — ABNORMAL LOW (ref 36.0–46.0)
Hemoglobin: 11.9 g/dL — ABNORMAL LOW (ref 12.0–15.0)
MCH: 29.5 pg (ref 26.0–34.0)
MCV: 83.7 fL (ref 78.0–100.0)
RDW: 12.6 % (ref 11.5–15.5)
WBC: 7.2 10*3/uL (ref 4.0–10.5)

## 2013-05-16 MED ORDER — GLIPIZIDE 10 MG PO TABS
10.0000 mg | ORAL_TABLET | Freq: Two times a day (BID) | ORAL | Status: DC
  Administered 2013-05-16 – 2013-05-17 (×3): 10 mg via ORAL
  Filled 2013-05-16 (×5): qty 1

## 2013-05-16 MED ORDER — METOPROLOL TARTRATE 12.5 MG HALF TABLET
12.5000 mg | ORAL_TABLET | Freq: Two times a day (BID) | ORAL | Status: DC
  Administered 2013-05-16 – 2013-05-17 (×3): 12.5 mg via ORAL
  Filled 2013-05-16 (×5): qty 1

## 2013-05-16 MED FILL — Sodium Chloride IV Soln 0.9%: INTRAVENOUS | Qty: 50 | Status: AC

## 2013-05-16 MED FILL — Sodium Chloride IV Soln 0.9%: INTRAVENOUS | Qty: 50 | Status: CN

## 2013-05-16 NOTE — Progress Notes (Signed)
CARDIAC REHAB PHASE I   PRE:  Rate/Rhythm: 75 SR    BP: lying 106/57, sitting 108/55    SaO2:   MODE:  Ambulation: 350 ft   POST:  Rate/Rhythm: 85 SR    BP: sitting 107/55     SaO2:   Pt seemed to tolerate fairly well. Apparently tired toward end of walk. When I asked, she stated she wanted to sit. BP stable at 115//65. Daughter sts she was just tired. Communication difficult.  Pt to recliner after walk with feet elevated. Gave daughter MI book, stent book. Discussed Brilinta. Gave MI and DM videos to watch. Will attempt to get a translator tomorrow before d/c.  [phone]  Elissa Lovett [place] CES, ACSM 05/16/2013 2:22 PM

## 2013-05-16 NOTE — Care Management Note (Addendum)
    Page 1 of 1   05/17/2013     9:57:41 AM   CARE MANAGEMENT NOTE 05/17/2013  Patient:  [name] [name], [name] [name]   Account Number:  [id number]  Date Initiated:  05/16/2013  Documentation initiated by:  Junius Creamer  Subjective/Objective Assessment:   adm w mi     Action/Plan:   lives w fam, pcp dr Terrilee Files   In-house referral  Financial Counselor      DC Planning Services  CM consult  Medication Assistance  PCP issues      Per UR Regulation:  Reviewed for med. necessity/level of care/duration of stay  Comments:  Contacts:  Makaylah Oddo, dtr  [phone]                   Keyaira Clapham, son  [phone]  05/17/13 0930 Emmary Culbreath RN MSN BSN CCM Pt has no PCP or insurance and needs f/u for uncontrolled DM.  [facility], [facility] are not taking new pts.  TC to [facility], left message on voice mail requesting appt within one week of discharge.  Also sent email message requesting appt.  TC to financial counselor, Marcelino Duster, who will see pt today and assist with medicaid application.  12/1 0935 debbie dowell rn,bsn spoke w pt and da. no ins. gave pt brilinta 30day free card. left pt assist form in chart for md to fill out.

## 2013-05-16 NOTE — Progress Notes (Signed)
Patient ID: [name] [name], female   DOB: Jun 03, 1956, 57 y.o.   MRN: [id number]   SUBJECTIVE: Son interprets for patient.  No further chest pain. BP is running low but she is asymptomatic (94/46 this morning, SBP in 80s overnight).    Marland Kitchen aspirin  81 mg Oral Daily  . atorvastatin  80 mg Oral q1800  . glipiZIDE  10 mg Oral BID AC  . influenza vac split quadrivalent PF  0.5 mL Intramuscular Tomorrow-1000  . insulin aspart  0-9 Units Subcutaneous TID WC  . metoprolol tartrate  12.5 mg Oral BID  . pantoprazole  40 mg Oral Q1200  . pneumococcal 23 valent vaccine  0.5 mL Intramuscular Tomorrow-1000  . Ticagrelor  90 mg Oral BID     Filed Vitals:   05/16/13 0408 05/16/13 0500 05/16/13 0600 05/16/13 0700  BP:  91/51 84/57 81/43   Pulse:      Temp: 98.1 F (36.7 C)     TempSrc: Oral     Resp:  19 19 17   Height:      Weight:      SpO2: 96%  96%     Intake/Output Summary (Last 24 hours) at 05/16/13 0817 Last data filed at 05/16/13 0000  Gross per 24 hour  Intake   1260 ml  Output   1750 ml  Net   -490 ml    LABS: Basic Metabolic Panel:  Recent Labs  13/08/65 0552 05/15/13 1130  NA 131* 132*  K 4.2 3.3*  CL 95* 96  CO2 21 24  GLUCOSE 286* 339*  BUN 9 8  CREATININE 0.54 0.59  CALCIUM 8.9 8.4   Liver Function Tests: No results found for this basename: AST, ALT, ALKPHOS, BILITOT, PROT, ALBUMIN,  in the last 72 hours No results found for this basename: LIPASE, AMYLASE,  in the last 72 hours CBC:  Recent Labs  05/15/13 0552 05/16/13 0352  WBC 8.4 7.2  HGB 12.0 11.9*  HCT 35.2* 33.8*  MCV 83.2 83.7  PLT 249 234   Cardiac Enzymes:  Recent Labs  05/14/13 2336 05/15/13 1130  TROPONINI <0.30 >20.00*   BNP: No components found with this basename: POCBNP,  D-Dimer: No results found for this basename: DDIMER,  in the last 72 hours Hemoglobin A1C:  Recent Labs  05/14/13 2336  HGBA1C 11.7*   Fasting Lipid Panel: No results found for this basename: CHOL, HDL,  LDLCALC, TRIG, CHOLHDL, LDLDIRECT,  in the last 72 hours Thyroid Function Tests: No results found for this basename: TSH, T4TOTAL, FREET3, T3FREE, THYROIDAB,  in the last 72 hours Anemia Panel: No results found for this basename: VITAMINB12, FOLATE, FERRITIN, TIBC, IRON, RETICCTPCT,  in the last 72 hours  RADIOLOGY: Dg Chest Port 1 View  05/14/2013   CLINICAL DATA:  Chest pain.  Short of breath.  EXAM: PORTABLE CHEST - 1 VIEW  COMPARISON:  None.  FINDINGS: Upper normal heart size. Lungs are under aerated with bibasilar atelectasis. No pneumothorax.  IMPRESSION: Bibasilar atelectasis.   Electronically Signed   By: Maryclare Bean M.D.   On: 05/14/2013 20:58    PHYSICAL EXAM General: NAD Neck: No JVD, no thyromegaly or thyroid nodule.  Lungs: Clear to auscultation bilaterally with normal respiratory effort. CV: Nondisplaced PMI.  Heart regular S1/S2, no S3/S4, no murmur.  No peripheral edema.  No carotid bruit.  Normal pedal pulses.  Abdomen: Soft, nontender, no hepatosplenomegaly, no distention.  Neurologic: Alert and oriented x 3.  Psych: Normal affect. Extremities: No clubbing or  cyanosis.   TELEMETRY: Reviewed telemetry pt in NSR  ASSESSMENT AND PLAN: 57 yo with history of HTN and DM presented with inferior STEMI, now s/p PCI to LCx.  1. CAD: s/p PCI to mid LCx with DES in setting of inferior STEMI.  ST segment elevation resolved on ECG done after PCI.  No further chest pain. EF 55-60% on echo.  - Ambulate with cardiac rehab.  2. Type II DM: Poor control based on hemoglobin A1c.  Will need PCP followup (does not have one).  Will increase glipizide to 10 mg bid.  Will consult diabetes coordinator.  3. Hypotension: Asymptomatic low BP (SBP as low as 80s).  Will hold lisinopril and decrease metoprolol to 12.5 mg bid.  Will keep in hospital today to keep an eye on her BP and make sure she does not have orthostatic symptoms, if no problems home tomorrow.   4. Will keep her on step-down due to  hypotension.   Marca Ancona 05/16/2013 8:17 AM

## 2013-05-16 NOTE — Progress Notes (Signed)
Inpatient Diabetes Program Recommendations  AACE/ADA: New Consensus Statement on Inpatient Glycemic Control (2013)  Target Ranges:  Prepandial:   less than 140 mg/dL      Peak postprandial:   less than 180 mg/dL (1-2 hours)      Critically ill patients:  140 - 180 mg/dL  Results for AERALYN, BARNA (MRN [id number]) as of 05/16/2013 14:11  Ref. Range 05/15/2013 12:12 05/15/2013 17:10 05/15/2013 21:45 05/16/2013 07:59 05/16/2013 12:10  Glucose-Capillary Latest Range: 70-99 mg/dL 409 (H) 811 (H) 914 (H) 185 (H) 347 (H)   Inpatient Diabetes Program Recommendations Insulin - Basal: may need basal insulin. for affordability, recommend Novolin 70/30 at discharge.   This coordinator spoke with patient's daughter about glucose management at home.  Patient does not have a meter. Gave information for [employer] generic meter at [employer].   Will follow up with family tomorrow for further support and to answer questions.  Results for ADRINNE, SZE (MRN [id number]) as of 05/16/2013 14:11  Ref. Range 05/14/2013 23:36  Hemoglobin A1C Latest Range: <5.7 % 11.7 (H)   Thank you  Piedad Climes BSN, RN,CDE Inpatient Diabetes Coordinator [phone] (team pager)

## 2013-05-17 ENCOUNTER — Telehealth: Payer: Self-pay | Admitting: Cardiovascular Disease

## 2013-05-17 ENCOUNTER — Encounter (HOSPITAL_COMMUNITY): Payer: Self-pay | Admitting: Nurse Practitioner

## 2013-05-17 DIAGNOSIS — E785 Hyperlipidemia, unspecified: Secondary | ICD-10-CM

## 2013-05-17 DIAGNOSIS — I2119 ST elevation (STEMI) myocardial infarction involving other coronary artery of inferior wall: Secondary | ICD-10-CM

## 2013-05-17 DIAGNOSIS — E1165 Type 2 diabetes mellitus with hyperglycemia: Secondary | ICD-10-CM

## 2013-05-17 DIAGNOSIS — I2510 Atherosclerotic heart disease of native coronary artery without angina pectoris: Secondary | ICD-10-CM

## 2013-05-17 LAB — CBC
Hemoglobin: 11.7 g/dL — ABNORMAL LOW (ref 12.0–15.0)
MCH: 29.5 pg (ref 26.0–34.0)
MCV: 85.6 fL (ref 78.0–100.0)
RBC: 3.97 MIL/uL (ref 3.87–5.11)

## 2013-05-17 LAB — BASIC METABOLIC PANEL
CO2: 25 mEq/L (ref 19–32)
Calcium: 9 mg/dL (ref 8.4–10.5)
Chloride: 99 mEq/L (ref 96–112)
Creatinine, Ser: 0.63 mg/dL (ref 0.50–1.10)
Glucose, Bld: 263 mg/dL — ABNORMAL HIGH (ref 70–99)

## 2013-05-17 LAB — GLUCOSE, CAPILLARY: Glucose-Capillary: 204 mg/dL — ABNORMAL HIGH (ref 70–99)

## 2013-05-17 MED ORDER — INSULIN NPH ISOPHANE & REGULAR (70-30) 100 UNIT/ML ~~LOC~~ SUSP: 8.0000 [IU] | Freq: Two times a day (BID) | SUBCUTANEOUS | Status: DC

## 2013-05-17 MED ORDER — NITROGLYCERIN 0.4 MG SL SUBL: 0.4000 mg | SUBLINGUAL_TABLET | SUBLINGUAL | Status: DC | PRN

## 2013-05-17 MED ORDER — INSULIN ASPART PROT & ASPART (70-30 MIX) 100 UNIT/ML ~~LOC~~ SUSP: 8.0000 [IU] | Freq: Two times a day (BID) | SUBCUTANEOUS | Status: DC

## 2013-05-17 MED ORDER — INSULIN ASPART PROT & ASPART (70-30 MIX) 100 UNIT/ML ~~LOC~~ SUSP
8.0000 [IU] | Freq: Two times a day (BID) | SUBCUTANEOUS | Status: DC
  Administered 2013-05-17: 8 [IU] via SUBCUTANEOUS
  Filled 2013-05-17: qty 10

## 2013-05-17 MED ORDER — ASPIRIN 81 MG PO CHEW: 81.0000 mg | CHEWABLE_TABLET | Freq: Every day | ORAL | Status: DC

## 2013-05-17 MED ORDER — TICAGRELOR 90 MG PO TABS: 90.0000 mg | ORAL_TABLET | Freq: Two times a day (BID) | ORAL | Status: DC

## 2013-05-17 MED ORDER — METOPROLOL TARTRATE 25 MG PO TABS: 12.5000 mg | ORAL_TABLET | Freq: Two times a day (BID) | ORAL | Status: DC

## 2013-05-17 MED ORDER — ATORVASTATIN CALCIUM 80 MG PO TABS: 80.0000 mg | ORAL_TABLET | Freq: Every day | ORAL | Status: DC

## 2013-05-17 MED ORDER — BD GETTING STARTED TAKE HOME KIT: 3/10ML X 30G SYRINGES
1.0000 | Freq: Once | Status: DC
  Filled 2013-05-17: qty 1

## 2013-05-17 MED ORDER — GLIPIZIDE 10 MG PO TABS: 10.0000 mg | ORAL_TABLET | Freq: Two times a day (BID) | ORAL | Status: DC

## 2013-05-17 MED ORDER — INSULIN ASPART PROT & ASPART (70-30 MIX) 100 UNIT/ML ~~LOC~~ SUSP
10.0000 [IU] | Freq: Two times a day (BID) | SUBCUTANEOUS | Status: DC
  Filled 2013-05-17: qty 10

## 2013-05-17 NOTE — Progress Notes (Addendum)
Patient ID: [name] [name], female   DOB: 06-Nov-1955, 57 y.o.   MRN: [id number]    SUBJECTIVE: Son interprets for patient.  No further chest pain. BP better this morning.  Blood glucose high.   Marland Kitchen aspirin  81 mg Oral Daily  . atorvastatin  80 mg Oral q1800  . glipiZIDE  10 mg Oral BID AC  . insulin aspart  0-9 Units Subcutaneous TID WC  . insulin aspart protamine- aspart  10 Units Subcutaneous BID WC  . metoprolol tartrate  12.5 mg Oral BID  . pantoprazole  40 mg Oral Q1200  . Ticagrelor  90 mg Oral BID     Filed Vitals:   05/16/13 2002 05/16/13 2011 05/17/13 0000 05/17/13 0400  BP: 116/71  87/50 101/61  Pulse: 79  68 68  Temp: 98.5 F (36.9 C) 98.5 F (36.9 C) 98.3 F (36.8 C) 98.3 F (36.8 C)  TempSrc: Oral Oral Oral Oral  Resp: 23  18 17   Height:      Weight:    154 lb 15.7 oz (70.3 kg)  SpO2: 100%  99% 100%    Intake/Output Summary (Last 24 hours) at 05/17/13 0729 Last data filed at 05/16/13 1200  Gross per 24 hour  Intake    150 ml  Output    151 ml  Net     -1 ml    LABS: Basic Metabolic Panel:  Recent Labs  40/98/11 1130 05/16/13 1015  NA 132* 135  K 3.3* 3.9  CL 96 100  CO2 24 25  GLUCOSE 339* 345*  BUN 8 12  CREATININE 0.59 0.69  CALCIUM 8.4 9.1   Liver Function Tests: No results found for this basename: AST, ALT, ALKPHOS, BILITOT, PROT, ALBUMIN,  in the last 72 hours No results found for this basename: LIPASE, AMYLASE,  in the last 72 hours CBC:  Recent Labs  05/16/13 0352 05/17/13 0630  WBC 7.2 6.4  HGB 11.9* 11.7*  HCT 33.8* 34.0*  MCV 83.7 85.6  PLT 234 211   Cardiac Enzymes:  Recent Labs  05/14/13 2336 05/15/13 1130  TROPONINI <0.30 >20.00*   BNP: No components found with this basename: POCBNP,  D-Dimer: No results found for this basename: DDIMER,  in the last 72 hours Hemoglobin A1C:  Recent Labs  05/14/13 2336  HGBA1C 11.7*   Fasting Lipid Panel: No results found for this basename: CHOL, HDL, LDLCALC, TRIG,  CHOLHDL, LDLDIRECT,  in the last 72 hours Thyroid Function Tests: No results found for this basename: TSH, T4TOTAL, FREET3, T3FREE, THYROIDAB,  in the last 72 hours Anemia Panel: No results found for this basename: VITAMINB12, FOLATE, FERRITIN, TIBC, IRON, RETICCTPCT,  in the last 72 hours  RADIOLOGY: Dg Chest Port 1 View  05/14/2013   CLINICAL DATA:  Chest pain.  Short of breath.  EXAM: PORTABLE CHEST - 1 VIEW  COMPARISON:  None.  FINDINGS: Upper normal heart size. Lungs are under aerated with bibasilar atelectasis. No pneumothorax.  IMPRESSION: Bibasilar atelectasis.   Electronically Signed   By: Maryclare Bean M.D.   On: 05/14/2013 20:58    PHYSICAL EXAM General: NAD Neck: No JVD, no thyromegaly or thyroid nodule.  Lungs: Clear to auscultation bilaterally with normal respiratory effort. CV: Nondisplaced PMI.  Heart regular S1/S2, no S3/S4, no murmur.  No peripheral edema.  No carotid bruit.  Normal pedal pulses.  Abdomen: Soft, nontender, no hepatosplenomegaly, no distention.  Neurologic: Alert and oriented x 3.  Psych: Normal affect. Extremities: No clubbing or  cyanosis.   TELEMETRY: Reviewed telemetry pt in NSR  ASSESSMENT AND PLAN: 57 yo with history of HTN and DM presented with inferior STEMI, now s/p PCI to LCx.  1. CAD: s/p PCI to mid LCx with DES in setting of inferior STEMI.  ST segment elevation resolved on ECG done after PCI.  No further chest pain. EF 55-60% on echo.  - Ambulate with cardiac rehab.  2. Type II DM: Poor control based on hemoglobin A1c.  Will need PCP followup (does not have one).  Will add insulin 70/30 10 units bid.  3. Hypotension: Improved.  I took her off lisinopril, can see if we can start this at outpatient followup. 4. Discharge home today.  Needs followup cardiology in 2 wks.  Needs PCP followup (does not have one).  Needs followup soon given blood glucose issues.  Should be seen within one week.  Would consider family practice clinic, internal med  clinic, or [facility].  Please arrange though before discharge.  Meds for discharge: ASA 81, Brilinta 90 bid, atorvastatin 80 daily, glipizide 10 bid, insulin 70/30 8 units bid, metoprolol 12.5 mg bid.  Should be seen again by diabetes coordinator prior to discharge to go over blood glucose checks, insulin, etc.    Marca Ancona 05/17/2013 7:29 AM

## 2013-05-17 NOTE — Discharge Summary (Signed)
Discharge Summary   Patient ID: [name] [name],  MRN: [id number], DOB/AGE: 1956-02-24 57 y.o.  Admit date: 05/14/2013 Discharge date: 05/17/2013  Primary Care Provider: Pt will f/u with [facility] Primary Cardiologist: Golden Circle, MD   Discharge Diagnoses Principal Problem:   ST elevation myocardial infarction (STEMI) of inferior wall, initial episode of care  **s/p PCI and DES of the Left Circumflex this admission.  Active Problems:   CAD (coronary artery disease)   Diabetes mellitus type 2, uncontrolled  **HbA1c 11.4 this admission.  **70/30 insulin initiated.   HTN (hypertension)   Hyperlipidemia  Allergies No Known Allergies  Procedures  Cardiac Catheterization and Percutaneous Coronary Intervention 11.30.2014  Procedural Findings:  Hemodynamics: AO:  110/70   mmHg LV:  112 /6    mmHg LVEDP: 15  mmHg  Coronary angiography: Coronary dominance: Right     Left Main:  Normal    Left Anterior Descending (LAD):  Normal in size with minor irregularities.  1st diagonal (D1):  Normal in size with no significant disease.  2nd diagonal (D2):  Medium in size with no significant disease.  3rd diagonal (D3):  Small in size with no significant disease.  Circumflex (LCx):  Normal in size with minor irregularities proximally. There is a 99% hazy stenosis in the midsegment which is likely the culprit for inferior ST elevation MI. The stenosis is at the origin of OM 2.        **The Left Circumflex was successfully stented using a 2.5 x 18 mm Xience expedition drug-eluting stent**   1st obtuse marginal:  Small in size with minor irregularities.  2nd obtuse marginal:  Normal in size with minor irregularities.  3rd obtuse marginal:  Normal in size with no significant disease.             Right Coronary Artery: normal in size with no significant disease.  Posterior descending artery: Normal  Posterior AV segment: Small in size with no  significant disease.  Posterolateral branchs:  Small and normal posterolateral branches.  Left ventriculography: Left ventricular systolic function is normal , LVEF is estimated at 55 %, there is no significant mitral regurgitation   Final Conclusions:  1. Severe one-vessel coronary artery disease with 99% hazy mid left circumflex stenosis which is the culprit for inferior ST elevation MI. 2. Normal LV systolic function and mildly elevated left ventricular end-diastolic pressure. 3. Successful angioplasty and drug-eluting stent placement to the left circumflex artery.  Recommendations:  Continue dual antiplatelet therapy for at least one year. Aggressive treatment of risk factors is recommended. _____________   2D Echocardiogram 11.30.2014  Study Conclusions  - Left ventricle: The cavity size was normal. Wall thickness   was increased in a pattern of mild LVH. Systolic function   was normal. The estimated ejection fraction was in the   range of 55% to 60%. Wall motion was normal; there were no   regional wall motion abnormalities. - Mitral valve: Mild regurgitation. - Left atrium: The atrium was mildly dilated. - Atrial septum: Redundant but no obvious PFO/ASD No defect   or patent foramen ovale was identified. _____________   History of Present Illness  57 year old female without prior cardiac history. She has a history of hypertension diabetes of her has not seen a medical provider in several years. She was in her usual state of health until November 25, when she experienced an episode of brief chest pain this subsided spontaneously. She had no further chest pain until the  evening of admission when she experienced recurrent substernal chest crushing and heaviness with radiation to her neck and posterior head, associated with dyspnea, nausea, and vomiting. She was taken to the [facility] long ED where she was treated with morphine and sublingual nitroglycerin and ECG showed anterior  T-wave changes. Initial troponin was normal. A cardiology consultation was requested and also in the emergency department, the patient developed recurrent chest pain with inferior ST segment elevation. She started on heparin and nitroglycerin infusion and a code STEMI was activated.   Hospital Course  Patient underwent emergent diagnostic cardiac catheterization on November 30, revealing a 99% hazy stenosis within the midsegment of the left circumflex. She otherwise had nonobstructive coronary artery disease and normal LV function. The left circumflex was felt to be the culprit for myocardial infarction and this was subsequently treated with a 2.5 x 18 mm Xience expedition drug-eluting stent. Patient tolerated procedure well and post procedure she was monitored in the coronary intensive care unit where she eventually peaked her troponin at greater than 20. She was placed on aspirin, brilinta, beta blocker, statin, and ACE inhibitor therapy. A 2-D echocardiogram was carried out on November 30, showing normal LV function. She had no further chest discomfort however due to relative hypotension, ACE inhibitor therapy was discontinued.  [name] [name] blood glucoses have been elevated throughout admission in the 200s and 300 range. Her hemoglobin A1c returned at 11.7. Diabetes management was consulted and recommended initiation of 70/30 insulin. Patient underwent diabetic teaching and her and her family have and counseling towards primary care followup for diabetes management. Arrangements are in process for her to followup at the [facility].  Patient has been seen by cardiac rehabilitation and has been ambulatory without recurrent symptoms or limitations. She will be discharged home today in good condition with followup arranged in our office in one week. We have worked with case management to keep her medications inexpensive have also provided her with a 30 day card for  brilinta.  Discharge Vitals Blood pressure 114/64, pulse 69, temperature 99.6 F (37.6 C), temperature source Oral, resp. rate 24, height 4\' 11"  (1.499 m), weight 154 lb 15.7 oz (70.3 kg), SpO2 100.00%.  Filed Weights   05/15/13 0315 05/16/13 1546 05/17/13 0400  Weight: 154 lb 5.2 oz (70 kg) 154 lb 15.7 oz (70.3 kg) 154 lb 15.7 oz (70.3 kg)   Labs  CBC  Recent Labs  05/16/13 0352 05/17/13 0630  WBC 7.2 6.4  HGB 11.9* 11.7*  HCT 33.8* 34.0*  MCV 83.7 85.6  PLT 234 211   Basic Metabolic Panel  Recent Labs  05/16/13 1015 05/17/13 0630  NA 135 134*  K 3.9 3.7  CL 100 99  CO2 25 25  GLUCOSE 345* 263*  BUN 12 12  CREATININE 0.69 0.63  CALCIUM 9.1 9.0   Cardiac Enzymes  Recent Labs  05/14/13 2336 05/15/13 1130  TROPONINI <0.30 >20.00*   Hemoglobin A1C  Recent Labs  05/14/13 2336  HGBA1C 11.7*   Disposition  Pt is being discharged home today in good condition.  Follow-up Plans & Appointments      Follow-up Information   Follow up with Tereso Newcomer, PA-C On 05/24/2013. (11:50 AM - Dr. Alford Highland PA)    Specialty:  Physician Assistant   Contact information:   [address] [place] [address] [phone]       Follow up with [facility]    . (  you will be contacted for an appointment this week.)    Contact information:   [address] [place] [address] [phone]     Discharge Medications    Medication List    STOP taking these medications       dextromethorphan 30 MG/5ML liquid  Commonly known as:  DELSYM     glyBURIDE-metformin 2.5-500 MG per tablet  Commonly known as:  GLUCOVANCE     losartan-hydrochlorothiazide 100-12.5 MG per tablet  Commonly known as:  HYZAAR      TAKE these medications       aspirin 81 MG chewable tablet  Chew 1 tablet (81 mg total) by mouth daily.     atorvastatin 80 MG tablet  Commonly known as:  LIPITOR  Take 1 tablet (80 mg total) by mouth  daily at 6 PM.     glipiZIDE 10 MG tablet  Commonly known as:  GLUCOTROL  Take 1 tablet (10 mg total) by mouth 2 (two) times daily before a meal.     insulin NPH-regular (70-30) 100 UNIT/ML injection  Commonly known as:  NOVOLIN 70/30  Inject 8 Units into the skin 2 (two) times daily with a meal.     metoprolol tartrate 25 MG tablet  Commonly known as:  LOPRESSOR  Take 0.5 tablets (12.5 mg total) by mouth 2 (two) times daily.     nitroGLYCERIN 0.4 MG SL tablet  Commonly known as:  NITROSTAT  Place 1 tablet (0.4 mg total) under the tongue every 5 (five) minutes as needed for chest pain.     Ticagrelor 90 MG Tabs tablet  Commonly known as:  BRILINTA  Take 1 tablet (90 mg total) by mouth 2 (two) times daily.       Outstanding Labs/Studies  Follow-up lipids/lft's in 6-8 wks (new statin).  Duration of Discharge Encounter   Greater than 30 minutes including physician time.  Signed, Nicolasa Ducking NP 05/17/2013, 1:59 PM

## 2013-05-17 NOTE — Progress Notes (Signed)
Pt ready for d/c. Education completed through translator. Voiced understanding and requests her name be sent to [facility]. Gave financial aid application.  [phone] Ethelda Chick CES, ACSM 1:38 PM 05/17/2013

## 2013-05-17 NOTE — Progress Notes (Signed)
D/c home accompanied by her son.

## 2013-05-17 NOTE — Progress Notes (Signed)
Artist and Diabetes Coordinator in to see prior to discharge.

## 2013-05-17 NOTE — Telephone Encounter (Signed)
New problem    Per [facility] APP    7 days with Tereso Newcomer appt on 12/9

## 2013-05-18 NOTE — Telephone Encounter (Signed)
Follow up    Pt's daughter returned your call please call her back or # [phone] [facility] son.

## 2013-05-18 NOTE — Telephone Encounter (Signed)
Left message to call back  

## 2013-05-18 NOTE — Telephone Encounter (Signed)
Patient contacted regarding discharge from [facility] on May 17, 2013.  Spoke with pt's daughter.   Patient understands to follow up with Tereso Newcomer, PA on May 24, 2013 at 11:50 at [facility] office on [facility] Office address given to pt's daughter.  Patient understands discharge instructions? yes Patient understands medications and regiment?  yes Patient understands to bring all medications to this visit? yes   I spoke with social work Agricultural engineer and interpreter will be arranged.

## 2013-05-24 ENCOUNTER — Encounter: Payer: Self-pay | Admitting: Physician Assistant

## 2013-05-24 ENCOUNTER — Ambulatory Visit (INDEPENDENT_AMBULATORY_CARE_PROVIDER_SITE_OTHER): Payer: Medicaid Other | Admitting: Physician Assistant

## 2013-05-24 ENCOUNTER — Ambulatory Visit: Payer: Medicaid Other | Attending: Internal Medicine

## 2013-05-24 VITALS — BP 127/86 | HR 79 | Temp 99.2°F | Resp 16 | Ht 59.0 in | Wt 146.0 lb

## 2013-05-24 VITALS — BP 130/86 | HR 81 | Ht 59.0 in | Wt 147.0 lb

## 2013-05-24 DIAGNOSIS — I2119 ST elevation (STEMI) myocardial infarction involving other coronary artery of inferior wall: Secondary | ICD-10-CM

## 2013-05-24 DIAGNOSIS — E119 Type 2 diabetes mellitus without complications: Secondary | ICD-10-CM

## 2013-05-24 DIAGNOSIS — E785 Hyperlipidemia, unspecified: Secondary | ICD-10-CM

## 2013-05-24 DIAGNOSIS — I10 Essential (primary) hypertension: Secondary | ICD-10-CM

## 2013-05-24 DIAGNOSIS — I2510 Atherosclerotic heart disease of native coronary artery without angina pectoris: Secondary | ICD-10-CM

## 2013-05-24 MED ORDER — ASPIRIN EC 81 MG PO TBEC: 81.0000 mg | DELAYED_RELEASE_TABLET | Freq: Every day | ORAL | Status: DC

## 2013-05-24 MED ORDER — METOPROLOL TARTRATE 25 MG PO TABS: 12.5000 mg | ORAL_TABLET | Freq: Two times a day (BID) | ORAL | Status: DC

## 2013-05-24 NOTE — Patient Instructions (Signed)
START ASPIRIN 81 MG DAY  START METOPROLOL TARTRATE 25 MG TABLET YOU WILL TAKE 1/2 TABLET TWICE DAILY THIS WILL EQUAL 12.5 MG TWICE DAILY  PLEASE FOLLOW UP WITH DR.MCLEAN IN ABOUT 6-8 WEEKS   MAKE SURE YOU DO NOT RUN OUT OF BRILINTA

## 2013-05-24 NOTE — Progress Notes (Signed)
Patient ID: [name] [name], female   DOB: Apr 02, 1956, 57 y.o.   MRN: [id number]  CC: follow up   HPI: 57 year old female with past medical history of hypertension, diabetes, dyslipidemia who presented to the clinic for follow up. No significant complaints at this time, no chest pain, no shortness of breath and no palpitations.   No Known Allergies Past Medical History  Diagnosis Date  . CAD (coronary artery disease)     a. 04/2013 Inf STEMI/PCI: LM nl, LAD min irregs, D1/2/3 nl, LCX 8m (2.5x18 Xience DES), OM1/2/3 nl, RCA nl, PDA nl, RPAV nl, RPL nl, EF 55%;  b. 04/2013 Echo: EF 55-60%, no rwma, mild MR, mildly dil LA.  Marland Kitchen Hypertension   . Hyperlipidemia   . Diabetes mellitus type 2, uncontrolled     a. 04/2013 HbA1c = 11.7.   Current Outpatient Prescriptions on File Prior to Visit  Medication Sig Dispense Refill  . aspirin EC 81 MG tablet Take 1 tablet (81 mg total) by mouth daily.      Marland Kitchen atorvastatin (LIPITOR) 80 MG tablet Take 1 tablet (80 mg total) by mouth daily at 6 PM.  30 tablet  6  . glipiZIDE (GLUCOTROL) 10 MG tablet Take 1 tablet (10 mg total) by mouth 2 (two) times daily before a meal.  60 tablet  3  . insulin NPH-regular (NOVOLIN 70/30) (70-30) 100 UNIT/ML injection Inject 8 Units into the skin 2 (two) times daily with a meal.  10 mL  2  . metoprolol tartrate (LOPRESSOR) 25 MG tablet Take 0.5 tablets (12.5 mg total) by mouth 2 (two) times daily.  60 tablet  11  . nitroGLYCERIN (NITROSTAT) 0.4 MG SL tablet Place 1 tablet (0.4 mg total) under the tongue every 5 (five) minutes as needed for chest pain.  25 tablet  3  . Ticagrelor (BRILINTA) 90 MG TABS tablet Take 1 tablet (90 mg total) by mouth 2 (two) times daily.  60 tablet  6   No current facility-administered medications on file prior to visit.   Family History  Problem Relation Age of Onset  . Heart disease Mother   . Heart disease Father    History   Social History  . Marital Status: Married    Spouse Name: N/A     Number of Children: N/A  . Years of Education: N/A   Occupational History  . Not on file.   Social History Main Topics  . Smoking status: Never Smoker   . Smokeless tobacco: Never Used  . Alcohol Use: No  . Drug Use: No  . Sexual Activity: Not on file   Other Topics Concern  . Not on file   Social History Narrative  . No narrative on file    Review of Systems  Constitutional: Negative for fever, chills, diaphoresis, activity change, appetite change and fatigue.  HENT: Negative for ear pain, nosebleeds, congestion, facial swelling, rhinorrhea, neck pain, neck stiffness and ear discharge.   Eyes: Negative for pain, discharge, redness, itching and visual disturbance.  Respiratory: Negative for cough, choking, chest tightness, shortness of breath, wheezing and stridor.   Cardiovascular: Negative for chest pain, palpitations and leg swelling.  Gastrointestinal: Negative for abdominal distention.  Genitourinary: Negative for dysuria, urgency, frequency, hematuria, flank pain, decreased urine volume, difficulty urinating and dyspareunia.  Musculoskeletal: Negative for back pain, joint swelling, arthralgias and gait problem.  Neurological: Negative for dizziness, tremors, seizures, syncope, facial asymmetry, speech difficulty, weakness, light-headedness, numbness and headaches.  Hematological: Negative for  adenopathy. Does not bruise/bleed easily.  Psychiatric/Behavioral: Negative for hallucinations, behavioral problems, confusion, dysphoric mood, decreased concentration and agitation.    Objective:   Filed Vitals:   05/24/13 1746  BP: 127/86  Pulse: 79  Temp: 99.2 F (37.3 C)  Resp: 16    Physical Exam  Constitutional: Appears well-developed and well-nourished. No distress.  HENT: Normocephalic. External right and left ear normal. Oropharynx is clear and moist.  Eyes: Conjunctivae and EOM are normal. PERRLA, no scleral icterus.  Neck: Normal ROM. Neck supple. No JVD. No  tracheal deviation. No thyromegaly.  CVS: RRR, S1/S2 appreciated Pulmonary: Effort and breath sounds normal, no stridor, rhonchi, wheezes, rales.  Abdominal: Soft. BS +,  no distension, tenderness, rebound or guarding.  Musculoskeletal: Normal range of motion. Lymphadenopathy: No lymphadenopathy noted, cervical, inguinal. Neuro: Alert. Normal reflexes, muscle tone coordination. No cranial nerve deficit. Skin: Skin is warm and dry. No rash noted. Not diaphoretic. No erythema. No pallor.  Psychiatric: Normal mood and affect. Behavior, judgment, thought content normal.   Lab Results  Component Value Date   WBC 6.4 05/17/2013   HGB 11.7* 05/17/2013   HCT 34.0* 05/17/2013   MCV 85.6 05/17/2013   PLT 211 05/17/2013   Lab Results  Component Value Date   CREATININE 0.63 05/17/2013   BUN 12 05/17/2013   NA 134* 05/17/2013   K 3.7 05/17/2013   CL 99 05/17/2013   CO2 25 05/17/2013    Lab Results  Component Value Date   HGBA1C 11.7* 05/14/2013   Lipid Panel  No results found for this basename: chol, trig, hdl, cholhdl, vldl, ldlcalc       Assessment and plan:   Patient Active Problem List   Diagnosis Date Noted  . Diabetes mellitus type 2, uncontrolled - we talked about increasing insulin by 2 units at bedtime if morning sugars are persistently above 160 - next A1c is due in 07/2013 - continue glipizide  05/17/2013  . Hyperlipidemia - continue atorvastatin 05/17/2013  . HTN (hypertension) - - We have discussed target BP range - I have advised pt to check BP regularly and to call us back if the numbers are higher than 140/90 - discussed the importance of compliance with medical therapy and diet  - continue metoprolol 05/14/2013

## 2013-05-24 NOTE — Progress Notes (Signed)
[address] [place], [place]  [address] Phone: [phone] Fax:  [phone]  Date:  05/24/2013   ID:  [name] [name], DOB 15-Nov-1955, MRN [id number]  PCP:  Lorine Bears, MD  Cardiologist:  Dr. Marca Ancona     History of Present Illness: [name] [name] is a 57 y.o. [place] female with a hx of diabetes and HTN. She was admitted 11/29-12/2 with an inferior STEMI. She presented with substernal crushing chest pain with radiation to her neck and head and associated dyspnea, nausea and vomiting. ECG demonstrated inferior ST elevation and she was taken to the Cath Lab. LHC (05/15/13): Mid CFX 99%. PCI: Xience expedition (2.5 x 18 mm) DES to the mid CFX; EF 55%.  Echocardiogram (05/15/13): Mild LVH, EF 55-60%, normal wall motion, mild MR, mild LAE. ACEI was discontinued secondary to low blood pressures. Hemoglobin A1c was 11.7. She was placed on insulin 70/30 in seen by diabetic management. She has been set up with primary care follow up.  She is here with her son and an interpreter today.  She has overall been doing well since d/c.  She has not been doing much activity.  She has a lot of diffuse back and leg pain.  She notes it with activity.  She denies any further chest pain.  She denies significant dyspnea.  No orthopnea, edema.  She describes waking up short of breath but does not seem to be describing PND.    Recent Labs: 05/14/2013: Pro B Natriuretic peptide (BNP) 34.1  05/17/2013: Creatinine 0.63; Hemoglobin 11.7*; Potassium 3.7   Wt Readings from Last 3 Encounters:  05/24/13 147 lb (66.679 kg)  05/17/13 154 lb 15.7 oz (70.3 kg)  05/17/13 154 lb 15.7 oz (70.3 kg)     Past Medical History  Diagnosis Date  . CAD (coronary artery disease)     a. 04/2013 Inf STEMI/PCI: LM nl, LAD min irregs, D1/2/3 nl, LCX 77m (2.5x18 Xience DES), OM1/2/3 nl, RCA nl, PDA nl, RPAV nl, RPL nl, EF 55%;  b. 04/2013 Echo: EF 55-60%, no rwma, mild MR, mildly dil LA.  Marland Kitchen Hypertension   .  Hyperlipidemia   . Diabetes mellitus type 2, uncontrolled     a. 04/2013 HbA1c = 11.7.    Current Outpatient Prescriptions  Medication Sig Dispense Refill  . atorvastatin (LIPITOR) 80 MG tablet Take 1 tablet (80 mg total) by mouth daily at 6 PM.  30 tablet  6  . glipiZIDE (GLUCOTROL) 10 MG tablet Take 1 tablet (10 mg total) by mouth 2 (two) times daily before a meal.  60 tablet  3  . insulin NPH-regular (NOVOLIN 70/30) (70-30) 100 UNIT/ML injection Inject 8 Units into the skin 2 (two) times daily with a meal.  10 mL  2  . nitroGLYCERIN (NITROSTAT) 0.4 MG SL tablet Place 1 tablet (0.4 mg total) under the tongue every 5 (five) minutes as needed for chest pain.  25 tablet  3  . Ticagrelor (BRILINTA) 90 MG TABS tablet Take 1 tablet (90 mg total) by mouth 2 (two) times daily.  60 tablet  6   No current facility-administered medications for this visit.    Allergies:   Review of patient's allergies indicates no known allergies.   Social History:  The patient  reports that she has never smoked. She has never used smokeless tobacco. She reports that she does not drink alcohol or use illicit drugs.   Family History:  The patient's family history includes Heart disease in  her father and mother.   ROS:  Please see the history of present illness.    All other systems reviewed and negative.   PHYSICAL EXAM: VS:  BP 130/86  Pulse 81  Ht 4\' 11"  (1.499 m)  Wt 147 lb (66.679 kg)  BMI 29.67 kg/m2  SpO2 98% Well nourished, well developed, in no acute distress HEENT: normal Neck: no JVD Cardiac:  normal S1, S2; RRR; no murmur Lungs:  clear to auscultation bilaterally, no wheezing, rhonchi or rales Abd: soft, nontender, no hepatomegaly Ext: no edema; right wrist without hematoma or mass  MSK: diffuse tenderness noted over thoracic and lumbar paraspinal muscles Skin: warm and dry Neuro:  CNs 2-12 intact, no focal abnormalities noted; bilateral LE strength equal and normal, patellar and achilles DTRs  2+ bilaterally  EKG:  NSR, HR 81, noraml axis, inf-lat TWI     ASSESSMENT AND PLAN:  1. CAD:  Doing well since recent inf STEMI tx with DES to the CFX.  She denies any angina.  She has not been that active since d/c.  She does not have insurance. I do not think she can afford cardiac rehab.  I have asked her son to contact us if she gets coverage so we can refer her to rehab at that point.  I have encouraged her to gradually increase her activity.  She is not taking ASA for unclear reasons.  I will give her samples of ASA 81 mg and she will take QD.  I will get her some samples of Brilinta today and arrange for assistance paperwork to be completed.  She is also not taking metoprolol for unclear reasons.  I will prescribe Metoprolol 12.5 mg BID.  Consider adding ACEI or ARB back at follow up.  Continue statin. 2. Hypertension:   Controlled.  Restart Metoprolol 12.5 bid.  Consider adding ACEI or ARB back at follow up. 3. Hyperlipidemia:  Continue statin.  Check Lipids and LFTs in 6 weeks.   4. Diabetes Mellitus:  F/u with PCP at [facility] today. 5. Back Pain: I suspect this is all MSK. F/u with PCP for evaluation.    Signed, Tereso Newcomer, PA-C  05/24/2013 12:31 PM

## 2013-05-24 NOTE — Progress Notes (Unsigned)
HFU CAD.

## 2013-05-24 NOTE — Patient Instructions (Signed)
Blood Glucose Monitoring, Adult  Monitoring your blood glucose (also know as blood sugar) helps you to manage your diabetes. It also helps you and your health care provider monitor your diabetes and determine how well your treatment plan is working.  WHY SHOULD YOU MONITOR YOUR BLOOD GLUCOSE?  · It can help you understand how food, exercise, and medicine affect your blood glucose.  · It allows you to know what your blood glucose is at any given moment. You can quickly tell if you are having low blood glucose (hypoglycemia) or high blood glucose (hyperglycemia).  · It can help you and your health care provider know how to adjust your medicines.  · It can help you understand how to manage an illness or adjust medicine for exercise.  WHEN SHOULD YOU TEST?  Your health care provider will help you decide how often you should check your blood glucose. This may depend on the type of diabetes you have, your diabetes control, or the types of medicines you are taking. Be sure to write down all of your blood glucose readings so that this information can be reviewed with your health care provider. See below for examples of testing times that your health care provider may suggest.  Type 1 Diabetes  · Test 4 times a day if you are in good control, using an insulin pump, or perform multiple daily injections.  · If your diabetes is not well-controlled or if you are sick, you may need to monitor more often.  · It is a good idea to also monitor:  · Before and after exercise.  · Between meals and 2 hours after a meal.  · Occasionally between 2:00 to 3:00 am.  Type 2 Diabetes  · It can vary with each person, but generally, if you are on insulin, test 4 times a day.  · If you take medicines by mouth (orally), test 2 times a day.  · If you are on a controlled diet, test once a day.  · If your diabetes is not well controlled or if you are sick, you may need to monitor more often.  HOW TO MONITOR YOUR BLOOD GLUCOSE  Supplies  Needed  · Blood glucose meter.  · Test strips for your meter. Each meter has its own strips. You must use the strips that go with your own meter.  · A pricking needle (lancet).  · A device that holds the lancet (lancing device).  · A journal or log book to write down your results.  Procedure  · Wash your hands with soap and water. Alcohol is not preferred.  · Prick the side of your finger (not the tip) with the lancet.  · Gently milk the finger until a small drop of blood appears.  · Follow the instructions that come with your meter for inserting the test strip, applying blood to the strip, and using your blood glucose meter.  Other Areas to Get Blood for Testing  Some meters allow you to use other areas of your body (other than your finger) to test your blood. These areas are called alternative sites. The most common alternative sites are:  · The forearm.  · The thigh.  · The back area of the lower leg.  · The palm of the hand.  The blood flow in these areas is slower. Therefore, the blood glucose values you get may be delayed, and the numbers are different from what you would get from your fingers. Do not use alternative   sites if you think you are having hypoglycemia. Your reading will not be accurate. Always use a finger if you are having hypoglycemia. Also, if you cannot feel your lows (hypoglycemia unawareness), always use your fingers for your blood glucose checks.  ADDITIONAL TIPS FOR GLUCOSE MONITORING  · Do not reuse lancets.  · Always carry your supplies with you.  · All blood glucose meters have a 24-hour "hotline" number to call if you have questions or need help.  · Adjust (calibrate) your blood glucose meter with a control solution after finishing a few boxes of strips.  BLOOD GLUCOSE RECORD KEEPING  It is a good idea to keep a daily record or log of your blood glucose readings. Most glucose meters, if not all, keep your glucose records stored in the meter. Some meters come with the ability to download  your records to your home computer. Keeping a record of your blood glucose readings is especially helpful if you are wanting to look for patterns. Make notes to go along with the blood glucose readings because you might forget what happened at that exact time. Keeping good records helps you and your health care provider to work together to achieve good diabetes management.   Document Released: 06/05/2003 Document Revised: 02/02/2013 Document Reviewed: 10/25/2012  ExitCare® Patient Information ©2014 [employer].

## 2013-06-04 ENCOUNTER — Emergency Department (HOSPITAL_COMMUNITY)
Admission: EM | Admit: 2013-06-04 | Discharge: 2013-06-04 | Disposition: A | Discharge status: Home / Self Care | Payer: Medicaid Other | Attending: Emergency Medicine | Admitting: Emergency Medicine

## 2013-06-04 ENCOUNTER — Emergency Department (HOSPITAL_COMMUNITY): Payer: Medicaid Other

## 2013-06-04 ENCOUNTER — Encounter (HOSPITAL_COMMUNITY): Payer: Self-pay | Admitting: Emergency Medicine

## 2013-06-04 DIAGNOSIS — Z9861 Coronary angioplasty status: Secondary | ICD-10-CM | POA: Insufficient documentation

## 2013-06-04 DIAGNOSIS — R079 Chest pain, unspecified: Secondary | ICD-10-CM | POA: Insufficient documentation

## 2013-06-04 DIAGNOSIS — E119 Type 2 diabetes mellitus without complications: Secondary | ICD-10-CM | POA: Insufficient documentation

## 2013-06-04 DIAGNOSIS — I10 Essential (primary) hypertension: Secondary | ICD-10-CM | POA: Insufficient documentation

## 2013-06-04 DIAGNOSIS — Y929 Unspecified place or not applicable: Secondary | ICD-10-CM | POA: Insufficient documentation

## 2013-06-04 DIAGNOSIS — S01309A Unspecified open wound of unspecified ear, initial encounter: Secondary | ICD-10-CM | POA: Insufficient documentation

## 2013-06-04 DIAGNOSIS — I2510 Atherosclerotic heart disease of native coronary artery without angina pectoris: Secondary | ICD-10-CM | POA: Insufficient documentation

## 2013-06-04 DIAGNOSIS — Z794 Long term (current) use of insulin: Secondary | ICD-10-CM | POA: Insufficient documentation

## 2013-06-04 DIAGNOSIS — Y939 Activity, unspecified: Secondary | ICD-10-CM | POA: Insufficient documentation

## 2013-06-04 DIAGNOSIS — Z7982 Long term (current) use of aspirin: Secondary | ICD-10-CM | POA: Insufficient documentation

## 2013-06-04 DIAGNOSIS — I252 Old myocardial infarction: Secondary | ICD-10-CM | POA: Insufficient documentation

## 2013-06-04 DIAGNOSIS — E785 Hyperlipidemia, unspecified: Secondary | ICD-10-CM | POA: Insufficient documentation

## 2013-06-04 DIAGNOSIS — X58XXXA Exposure to other specified factors, initial encounter: Secondary | ICD-10-CM | POA: Insufficient documentation

## 2013-06-04 DIAGNOSIS — Z79899 Other long term (current) drug therapy: Secondary | ICD-10-CM | POA: Insufficient documentation

## 2013-06-04 LAB — COMPREHENSIVE METABOLIC PANEL
ALT: 21 U/L (ref 0–35)
Alkaline Phosphatase: 58 U/L (ref 39–117)
BUN: 11 mg/dL (ref 6–23)
CO2: 26 mEq/L (ref 19–32)
GFR calc Af Amer: >90 mL/min (ref >90)
GFR calc non Af Amer: >90 mL/min (ref >90)
Glucose, Bld: 127 mg/dL — ABNORMAL HIGH (ref 70–99)
Potassium: 4.2 mEq/L (ref 3.5–5.1)
Sodium: 140 mEq/L (ref 135–145)

## 2013-06-04 LAB — CBC WITH DIFFERENTIAL/PLATELET
Basophils Absolute: 0 10*3/uL (ref 0.0–0.1)
Basophils Relative: 0 % (ref 0–1)
Eosinophils Absolute: 0.2 10*3/uL (ref 0.0–0.7)
Hemoglobin: 12 g/dL (ref 12.0–15.0)
Lymphs Abs: 2.5 10*3/uL (ref 0.7–4.0)
MCH: 29.2 pg (ref 26.0–34.0)
MCHC: 33.7 g/dL (ref 30.0–36.0)
MCV: 86.6 fL (ref 78.0–100.0)
Monocytes Absolute: 0.6 10*3/uL (ref 0.1–1.0)
Neutrophils Relative %: 51 % (ref 43–77)
Platelets: 248 10*3/uL (ref 150–400)
RDW: 12.5 % (ref 11.5–15.5)

## 2013-06-04 MED ORDER — SODIUM CHLORIDE 0.9 % IV SOLN
INTRAVENOUS | Status: DC
  Administered 2013-06-04: 22:00:00 via INTRAVENOUS

## 2013-06-04 MED ORDER — BACITRACIN ZINC 500 UNIT/GM EX OINT
TOPICAL_OINTMENT | Freq: Two times a day (BID) | CUTANEOUS | Status: DC
  Administered 2013-06-04: 1 via TOPICAL
  Filled 2013-06-04: qty 28.35

## 2013-06-04 NOTE — ED Notes (Signed)
Pt c/o bleeding from her lt ear all day no pain

## 2013-06-04 NOTE — ED Provider Notes (Addendum)
Medical screening examination/treatment/procedure(s) were conducted as a shared visit with non-physician practitioner(s) and myself.  I personally evaluated the patient during the encounter.  EKG Interpretation    Date/Time:  Saturday June 04 2013 18:35:28 EST Ventricular Rate:  63 PR Interval:  143 QRS Duration: 68 QT Interval:  446 QTC Calculation: 457 R Axis:   36 Text Interpretation:  Sinus rhythm Low voltage, precordial leads Confirmed by Maisyn Nouri  MD, Brailynn Breth ([id number]) on 06/04/2013 9:13:47 PM Also confirmed by Deretha Emory  MD, Zykeria Laguardia ([id number])  on 06/04/2013 9:45:00 PM             Shelda Jakes, MD 06/04/13 2149  Results for orders placed during the hospital encounter of 06/04/13  COMPREHENSIVE METABOLIC PANEL      Result Value Range   Sodium 140  135 - 145 mEq/L   Potassium 4.2  3.5 - 5.1 mEq/L   Chloride 106  96 - 112 mEq/L   CO2 26  19 - 32 mEq/L   Glucose, Bld 127 (*) 70 - 99 mg/dL   BUN 11  6 - 23 mg/dL   Creatinine, Ser 4.09  0.50 - 1.10 mg/dL   Calcium 8.8  8.4 - 81.1 mg/dL   Total Protein 7.3  6.0 - 8.3 g/dL   Albumin 3.4 (*) 3.5 - 5.2 g/dL   AST 28  0 - 37 U/L   ALT 21  0 - 35 U/L   Alkaline Phosphatase 58  39 - 117 U/L   Total Bilirubin 0.3  0.3 - 1.2 mg/dL   GFR calc non Af Amer >90  >90 mL/min   GFR calc Af Amer >90  >90 mL/min  PRO B NATRIURETIC PEPTIDE      Result Value Range   Pro B Natriuretic peptide (BNP) 352.3 (*) 0 - 125 pg/mL  CBC WITH DIFFERENTIAL      Result Value Range   WBC 6.7  4.0 - 10.5 K/uL   RBC 4.11  3.87 - 5.11 MIL/uL   Hemoglobin 12.0  12.0 - 15.0 g/dL   HCT 91.4 (*) 78.2 - 95.6 %   MCV 86.6  78.0 - 100.0 fL   MCH 29.2  26.0 - 34.0 pg   MCHC 33.7  30.0 - 36.0 g/dL   RDW 21.3  08.6 - 57.8 %   Platelets 248  150 - 400 K/uL   Neutrophils Relative % 51  43 - 77 %   Neutro Abs 3.4  1.7 - 7.7 K/uL   Lymphocytes Relative 37  12 - 46 %   Lymphs Abs 2.5  0.7 - 4.0 K/uL   Monocytes Relative 9  3 - 12 %   Monocytes Absolute  0.6  0.1 - 1.0 K/uL   Eosinophils Relative 3  0 - 5 %   Eosinophils Absolute 0.2  0.0 - 0.7 K/uL   Basophils Relative 0  0 - 1 %   Basophils Absolute 0.0  0.0 - 0.1 K/uL   Dg Chest 2 View  06/04/2013   CLINICAL DATA:  Chest pain  EXAM: CHEST  2 VIEW  COMPARISON:  05/14/2013  FINDINGS: The heart size and mediastinal contours are within normal limits. Aortic atherosclerosis. No effusion or pneumothorax. Both lungs are clear. The visualized skeletal structures are unremarkable.  IMPRESSION: No active cardiopulmonary disease.   Electronically Signed   By: Tiburcio Pea M.D.   On: 06/04/2013 22:43   Dg Chest Port 1 View  05/14/2013   CLINICAL DATA:  Chest pain.  Short  of breath.  EXAM: PORTABLE CHEST - 1 VIEW  COMPARISON:  None.  FINDINGS: Upper normal heart size. Lungs are under aerated with bibasilar atelectasis. No pneumothorax.  IMPRESSION: Bibasilar atelectasis.   Electronically Signed   By: Maryclare Bean M.D.   On: 05/14/2013 20:58    Patient with recent stent placement in November. Patient's followed by cardiology locally. Patient's chest pain is clinically not worrisome was referred over from fast track chest pain her chest pain is very brief 1 or 2 seconds on none currently been occurring on and off since the stent was placed her cardiologist is aware of this. Labs were ordered prior to patient being seen if patient seen by me when ordered and the labs other than the EKG. EKG had no acute changes. Chest x-ray here tonight is negative no pneumothorax no pulmonary edema no pneumonia. Patient's basic labs are normal. BNP with slight elevation but hasn't set chest x-ray shows no evidence CHF. Troponin was ordered but canceled however we do not really need that based on her very brief chest pain and the fact that it has been persistent since the stent. Patient stable for discharge home. Treatment for the ear problem as per the nurse practitioner.    Shelda Jakes, MD 06/04/13 2308

## 2013-06-04 NOTE — ED Provider Notes (Signed)
CSN: [id number]     Arrival date & time 06/04/13  1746 History  This chart was scribed for non-physician practitioner, Kerrie Buffalo, FNP,working with Shelda Jakes, MD, by Karle Plumber, ED Scribe.  This patient was seen in room TR06C/TR06C and the patient's care was started at 5:58 PM.  Chief Complaint  Patient presents with  . Ear Problem   The history is provided by the patient. No language interpreter was used.   HPI Comments:  [name] [name] is a 57 y.o. female who presents to the Emergency Department complaining of sudden onset intermittent bleeding from her left ear that started this morning. Pt denies any pain at this time. Pt denies fever, chills, nausea, vomiting, headache, cough, or congestion. She reports h/o heart surgery approximately two weeks ago. She reports taking ASA daily since the surgery. Pt also complains of intermittent left-sided mild chest pain for approximately one week. Pt states she was discharged from the hospital two weeks ago after stent placement. She was started on ASA a week after being discharged from the hospital a week after her surgery at a follow up appt. She states she then began having the CP.    Past Medical History  Diagnosis Date  . CAD (coronary artery disease)     a. 04/2013 Inf STEMI/PCI: LM nl, LAD min irregs, D1/2/3 nl, LCX 78m (2.5x18 Xience DES), OM1/2/3 nl, RCA nl, PDA nl, RPAV nl, RPL nl, EF 55%;  b. 04/2013 Echo: EF 55-60%, no rwma, mild MR, mildly dil LA.  Marland Kitchen Hypertension   . Hyperlipidemia   . Diabetes mellitus type 2, uncontrolled     a. 04/2013 HbA1c = 11.7.   Past Surgical History  Procedure Laterality Date  . Cesarean section     Family History  Problem Relation Age of Onset  . Heart disease Mother   . Heart disease Father    History  Substance Use Topics  . Smoking status: Never Smoker   . Smokeless tobacco: Never Used  . Alcohol Use: No   OB History   Grav Para Term Preterm Abortions TAB SAB Ect Mult Living                  Review of Systems  Constitutional: Negative for fever and chills.  HENT: Positive for ear discharge (bleeding from left ear). Negative for congestion and ear pain.   Respiratory: Negative for cough.   Cardiovascular: Positive for chest pain (mild, intermittent).  Gastrointestinal: Negative for nausea and vomiting.  Neurological: Negative for headaches.    Allergies  Review of patient's allergies indicates no known allergies.  Home Medications   Current Outpatient Rx  Name  Route  Sig  Dispense  Refill  . aspirin EC 81 MG tablet   Oral   Take 1 tablet (81 mg total) by mouth daily.         Marland Kitchen atorvastatin (LIPITOR) 80 MG tablet   Oral   Take 1 tablet (80 mg total) by mouth daily at 6 PM.   30 tablet   6   . glipiZIDE (GLUCOTROL) 10 MG tablet   Oral   Take 1 tablet (10 mg total) by mouth 2 (two) times daily before a meal.   60 tablet   3   . insulin NPH-regular (NOVOLIN 70/30) (70-30) 100 UNIT/ML injection   Subcutaneous   Inject 8 Units into the skin 2 (two) times daily with a meal.   10 mL   2   . metoprolol tartrate (LOPRESSOR)  25 MG tablet   Oral   Take 0.5 tablets (12.5 mg total) by mouth 2 (two) times daily.   60 tablet   11   . nitroGLYCERIN (NITROSTAT) 0.4 MG SL tablet   Sublingual   Place 1 tablet (0.4 mg total) under the tongue every 5 (five) minutes as needed for chest pain.   25 tablet   3   . Ticagrelor (BRILINTA) 90 MG TABS tablet   Oral   Take 1 tablet (90 mg total) by mouth 2 (two) times daily.   60 tablet   6    Triage Vitals: BP 142/66  Pulse 66  Temp(Src) 97.9 F (36.6 C) (Oral)  Resp 18  Wt 150 lb 8 oz (68.266 kg)  SpO2 100% Physical Exam  Nursing note and vitals reviewed. Constitutional: She is oriented to person, place, and time. She appears well-developed and well-nourished.  HENT:  Head: Normocephalic and atraumatic.  Right Ear: Hearing, tympanic membrane, external ear and ear canal normal. No drainage.  Tympanic membrane is not erythematous.  Left Ear: Hearing and tympanic membrane normal. Left ear exhibits lacerations. Tympanic membrane is not erythematous.  Mouth/Throat: Oropharynx is clear and moist. No oropharyngeal exudate.  Superficial lac to left ear canal that is actively bleeding.  Eyes: EOM are normal.  Neck: Normal range of motion.  Cardiovascular: Normal rate, regular rhythm and normal heart sounds.  Exam reveals no gallop and no friction rub.   No murmur heard. Pulmonary/Chest: Effort normal and breath sounds normal. No respiratory distress. She has no wheezes. She has no rales. She exhibits no tenderness.  Musculoskeletal: Normal range of motion.  Lymphadenopathy:    She has no cervical adenopathy.  Neurological: She is alert and oriented to person, place, and time.  Skin: Skin is warm and dry.  Psychiatric: She has a normal mood and affect. Her behavior is normal.    ED Course  Procedures (including critical care time) DIAGNOSTIC STUDIES: Oxygen Saturation is 100% on RA, normal by my interpretation.   COORDINATION OF CARE: 6:02 PM- Will apply antibiotic ointment. Pt verbalizes understanding and agrees to plan.  Medications - No data to display   EKG Interpretation   None       MDM  Patient now complains of chest pain. She had heart problems and was hospitalized and had a stent placed a few weeks ago. Was discharged home 2 weeks ago and did well for the first week. For the past few days has started to complain of similar pain to what she had before the stent was placed but not as bad. She would like to have her heart checked out while she is here today.   57 y.o. female with superficial laceration to the left ear canal. Will apply bacitracin ointment to the area.  Patient now complaining of chest pain with history of cardiac disease. She request work up for chest pain. Will move to Pod A for further evaluation of chest pain.  I discussed this patient with Dr.  Deretha Emory and he will assume care of the patient.   I personally performed the services described in this documentation, which was scribed in my presence. The recorded information has been reviewed and is accurate.    [facility] Orlene Och, [place] 06/04/13 [phone]

## 2013-06-04 NOTE — ED Notes (Signed)
Acuity was changed per [facility], NP because pt c/o having chest pain in addition to original c/o ear pain/bleeding

## 2013-06-04 NOTE — ED Notes (Signed)
Pt's family member states patient has intermittent L sided chest pain, ongoing for several days. Describes as pressure sensation. 5/10 pain at the time. Respirations unlabored. Speaking complete sentences. Pt is alert and oriented x4. Does not speak english, family at bedside to translate.

## 2013-06-15 DIAGNOSIS — E785 Hyperlipidemia, unspecified: Secondary | ICD-10-CM

## 2013-06-15 DIAGNOSIS — E119 Type 2 diabetes mellitus without complications: Secondary | ICD-10-CM

## 2013-06-20 ENCOUNTER — Encounter: Payer: Self-pay | Admitting: *Deleted

## 2013-07-01 ENCOUNTER — Other Ambulatory Visit: Payer: Self-pay

## 2013-07-01 ENCOUNTER — Ambulatory Visit: Payer: Self-pay | Admitting: Cardiology

## 2013-07-20 ENCOUNTER — Ambulatory Visit (INDEPENDENT_AMBULATORY_CARE_PROVIDER_SITE_OTHER): Payer: Medicaid Other | Admitting: Cardiology

## 2013-07-20 ENCOUNTER — Encounter: Payer: Self-pay | Admitting: Cardiology

## 2013-07-20 ENCOUNTER — Other Ambulatory Visit: Payer: Medicaid Other

## 2013-07-20 VITALS — BP 130/80 | HR 72 | Ht 59.0 in | Wt 147.0 lb

## 2013-07-20 DIAGNOSIS — I10 Essential (primary) hypertension: Secondary | ICD-10-CM

## 2013-07-20 DIAGNOSIS — E785 Hyperlipidemia, unspecified: Secondary | ICD-10-CM

## 2013-07-20 DIAGNOSIS — I2510 Atherosclerotic heart disease of native coronary artery without angina pectoris: Secondary | ICD-10-CM

## 2013-07-20 MED ORDER — LISINOPRIL 2.5 MG PO TABS: 2.5000 mg | ORAL_TABLET | Freq: Every day | ORAL | Status: DC

## 2013-07-20 NOTE — Patient Instructions (Signed)
Start lisinopril 2.5mg  daily.   Your physician recommends that you return for a FASTING lipid profile /BMET in 2 weeks.   Your physician wants you to follow-up in: 6 months with Dr Aundra Dubin. (August 2015).  You will receive a reminder letter in the mail two months in advance. If you don't receive a letter, please call our office to schedule the follow-up appointment.

## 2013-07-20 NOTE — Progress Notes (Signed)
Patient ID: [name] [name], female   DOB: [birth year], 58 y.o.   MRN: [id number] PCP: Dr. Charlies Silvers  58 yo with history diabetes, HTN, and CAD presents for cardiology followup.  She is [place] and speaks little [place].  There is an interpreter today.  Patient was admitted in 11/14 with inferior STEMI.  She had DES to mLCx.  EF was preserved.  Since the MI, has has been doing well.  Cardiac rehab will not be feasible due to the language barrier.  She has not had any chest pain.  She does not get exertional dyspnea.  She walks in her house or in stores without trouble.  She has had fairly poor glucose control long-term.  While in the hospital, she did not tolerate ACEI due to fall in BP.    Labs (12/14); K 4.2, creatinine 0.68, BNP 352, HCT 35.6  PMH: 1. Type II diabetes 2. HTN 3. CAD: Inferior STEMI 11/14.  LHC (11/14) with 99% mLCx stenosis treated with Xience DES.  EF 55%.  Echo (11/14) with EF 55-60%, mild LVH, mild MR.  4. Hyperlipidemia  SH: Lives with daughter, nonsmoker.  From [place], speaks little [place].   FH: Parents with CAD.  ROS: All systems reviewed and negative except as per HPI.   Current Outpatient Prescriptions  Medication Sig Dispense Refill  . aspirin EC 81 MG tablet Take 1 tablet (81 mg total) by mouth daily.      Marland Kitchen atorvastatin (LIPITOR) 80 MG tablet Take 1 tablet (80 mg total) by mouth daily at 6 PM.  30 tablet  6  . glipiZIDE (GLUCOTROL) 10 MG tablet Take 1 tablet (10 mg total) by mouth 2 (two) times daily before a meal.  60 tablet  3  . insulin NPH-regular (NOVOLIN 70/30) (70-30) 100 UNIT/ML injection Inject 8-10 Units into the skin 2 (two) times daily with a meal. Injects 10 units in the morning and 8 units in the evening      . metoprolol tartrate (LOPRESSOR) 25 MG tablet Take 0.5 tablets (12.5 mg total) by mouth 2 (two) times daily.  60 tablet  11  . nitroGLYCERIN (NITROSTAT) 0.4 MG SL tablet Place 1 tablet (0.4 mg total) under the tongue every 5 (five) minutes as  needed for chest pain.  25 tablet  3  . Ticagrelor (BRILINTA) 90 MG TABS tablet Take 1 tablet (90 mg total) by mouth 2 (two) times daily.  60 tablet  6  . lisinopril (PRINIVIL,ZESTRIL) 2.5 MG tablet Take 1 tablet (2.5 mg total) by mouth daily.  30 tablet  6   No current facility-administered medications for this visit.    BP 130/80  Pulse 72  Ht 4\' 11"  (1.499 m)  Wt 147 lb (66.679 kg)  BMI 29.67 kg/m2 General: NAD Neck: No JVD, no thyromegaly or thyroid nodule.  Lungs: Clear to auscultation bilaterally with normal respiratory effort. CV: Nondisplaced PMI.  Heart regular S1/S2, no S3/S4, no murmur.  No peripheral edema.  No carotid bruit.  Normal pedal pulses.  Abdomen: Soft, nontender, no hepatosplenomegaly, no distention.  Skin: Intact without lesions or rashes.  Neurologic: Alert and oriented x 3.  Psych: Normal affect. Extremities: No clubbing or cyanosis.   Assessment/Plan: 1. CAD: s/p inferior STEMI with Xience DES to mLCx.  No ischemic symptoms.  EF preserved.  - Continue ASA 81 mg daily and Brilinta 90 mg bid.  After 1 year, would stop Brilinta and use clopidogrel 75 mg daily unless there are bleeding problems.  - Conitnue  statin, metoprolol. - BP looks fine at this point.  I will have her start on low dose lisinopril 2.5 mg daily with BMET in 2 wks.  2. Hyperlipidemia: Check lipids in 2 wks.  3. Diabetes: Needs close followup with PCP due to poor control of blood glucose.   Loralie Champagne 07/21/2013

## 2013-07-25 ENCOUNTER — Ambulatory Visit: Payer: Medicaid Other | Attending: Internal Medicine | Admitting: Internal Medicine

## 2013-07-25 ENCOUNTER — Encounter: Payer: Self-pay | Admitting: Internal Medicine

## 2013-07-25 VITALS — BP 144/86 | HR 75 | Temp 98.2°F | Resp 17

## 2013-07-25 DIAGNOSIS — Z79899 Other long term (current) drug therapy: Secondary | ICD-10-CM | POA: Insufficient documentation

## 2013-07-25 DIAGNOSIS — Z139 Encounter for screening, unspecified: Secondary | ICD-10-CM

## 2013-07-25 DIAGNOSIS — IMO0001 Reserved for inherently not codable concepts without codable children: Secondary | ICD-10-CM | POA: Insufficient documentation

## 2013-07-25 DIAGNOSIS — Z794 Long term (current) use of insulin: Secondary | ICD-10-CM | POA: Insufficient documentation

## 2013-07-25 DIAGNOSIS — Z23 Encounter for immunization: Secondary | ICD-10-CM | POA: Insufficient documentation

## 2013-07-25 DIAGNOSIS — Z7982 Long term (current) use of aspirin: Secondary | ICD-10-CM | POA: Insufficient documentation

## 2013-07-25 DIAGNOSIS — E785 Hyperlipidemia, unspecified: Secondary | ICD-10-CM | POA: Insufficient documentation

## 2013-07-25 DIAGNOSIS — E1165 Type 2 diabetes mellitus with hyperglycemia: Principal | ICD-10-CM

## 2013-07-25 DIAGNOSIS — Z1211 Encounter for screening for malignant neoplasm of colon: Secondary | ICD-10-CM

## 2013-07-25 DIAGNOSIS — I252 Old myocardial infarction: Secondary | ICD-10-CM | POA: Insufficient documentation

## 2013-07-25 DIAGNOSIS — I10 Essential (primary) hypertension: Secondary | ICD-10-CM | POA: Insufficient documentation

## 2013-07-25 DIAGNOSIS — I2510 Atherosclerotic heart disease of native coronary artery without angina pectoris: Secondary | ICD-10-CM | POA: Insufficient documentation

## 2013-07-25 DIAGNOSIS — E119 Type 2 diabetes mellitus without complications: Secondary | ICD-10-CM

## 2013-07-25 LAB — POCT GLYCOSYLATED HEMOGLOBIN (HGB A1C): Hemoglobin A1C: 7.5

## 2013-07-25 LAB — GLUCOSE, POCT (MANUAL RESULT ENTRY): POC Glucose: 196 mg/dl — AB (ref 70–99)

## 2013-07-25 MED ORDER — INSULIN ASPART PROT & ASPART (70-30 MIX) 100 UNIT/ML PEN: PEN_INJECTOR | SUBCUTANEOUS | Status: DC

## 2013-07-25 NOTE — Progress Notes (Signed)
Patient here for follow up-DM HTN

## 2013-07-25 NOTE — Patient Instructions (Signed)
2 Gram Low Sodium Diet A 2 gram sodium diet restricts the amount of sodium in the diet to no more than 2 g or 2000 mg daily. Limiting the amount of sodium is often used to help lower blood pressure. It is important if you have heart, liver, or kidney problems. Many foods contain sodium for flavor and sometimes as a preservative. When the amount of sodium in a diet needs to be low, it is important to know what to look for when choosing foods and drinks. The following includes some information and guidelines to help make it easier for you to adapt to a low sodium diet. QUICK TIPS  Do not add salt to food.  Avoid convenience items and fast food.  Choose unsalted snack foods.  Buy lower sodium products, often labeled as "lower sodium" or "no salt added."  Check food labels to learn how much sodium is in 1 serving.  When eating at a restaurant, ask that your food be prepared with less salt or none, if possible. READING FOOD LABELS FOR SODIUM INFORMATION The nutrition facts label is a good place to find how much sodium is in foods. Look for products with no more than 500 to 600 mg of sodium per meal and no more than 150 mg per serving. Remember that 2 g = 2000 mg. The food label may also list foods as:  Sodium-free: Less than 5 mg in a serving.  Very low sodium: 35 mg or less in a serving.  Low-sodium: 140 mg or less in a serving.  Light in sodium: 50% less sodium in a serving. For example, if a food that usually has 300 mg of sodium is changed to become light in sodium, it will have 150 mg of sodium.  Reduced sodium: 25% less sodium in a serving. For example, if a food that usually has 400 mg of sodium is changed to reduced sodium, it will have 300 mg of sodium. CHOOSING FOODS Grains  Avoid: Salted crackers and snack items. Some cereals, including instant hot cereals. Bread stuffing and biscuit mixes. Seasoned rice or pasta mixes.  Choose: Unsalted snack items. Low-sodium cereals, oats,  puffed wheat and rice, shredded wheat. English muffins and bread. Pasta. Meats  Avoid: Salted, canned, smoked, spiced, pickled meats, including fish and poultry. Bacon, ham, sausage, cold cuts, hot dogs, anchovies.  Choose: Low-sodium canned tuna and salmon. Fresh or frozen meat, poultry, and fish. Dairy  Avoid: Processed cheese and spreads. Cottage cheese. Buttermilk and condensed milk. Regular cheese.  Choose: Milk. Low-sodium cottage cheese. Yogurt. Sour cream. Low-sodium cheese. Fruits and Vegetables  Avoid: Regular canned vegetables. Regular canned tomato sauce and paste. Frozen vegetables in sauces. Olives. Pickles. Relishes. Sauerkraut.  Choose: Low-sodium canned vegetables. Low-sodium tomato sauce and paste. Frozen or fresh vegetables. Fresh and frozen fruit. Condiments  Avoid: Canned and packaged gravies. Worcestershire sauce. Tartar sauce. Barbecue sauce. Soy sauce. Steak sauce. Ketchup. Onion, garlic, and table salt. Meat flavorings and tenderizers.  Choose: Fresh and dried herbs and spices. Low-sodium varieties of mustard and ketchup. Lemon juice. Tabasco sauce. Horseradish. SAMPLE 2 GRAM SODIUM MEAL PLAN Breakfast / Sodium (mg)  1 cup low-fat milk / 143 mg  2 slices whole-wheat toast / 270 mg  1 tbs heart-healthy margarine / 153 mg  1 hard-boiled egg / 139 mg  1 small orange / 0 mg Lunch / Sodium (mg)  1 cup raw carrots / 76 mg   cup hummus / 298 mg  1 cup low-fat milk /   143 mg   cup red grapes / 2 mg  1 whole-wheat pita bread / 356 mg Dinner / Sodium (mg)  1 cup whole-wheat pasta / 2 mg  1 cup low-sodium tomato sauce / 73 mg  3 oz lean ground beef / 57 mg  1 small side salad (1 cup raw spinach leaves,  cup cucumber,  cup yellow bell pepper) with 1 tsp olive oil and 1 tsp red wine vinegar / 25 mg Snack / Sodium (mg)  1 container low-fat vanilla yogurt / 107 mg  3 graham cracker squares / 127 mg Nutrient Analysis  Calories: 2033  Protein:  77 g  Carbohydrate: 282 g  Fat: 72 g  Sodium: 1971 mg Document Released: 06/02/2005 Document Revised: 08/25/2011 Document Reviewed: 09/03/2009 [employer] Patient Information 2014 [employer].  

## 2013-07-25 NOTE — Progress Notes (Signed)
MRN: [id number] Name: [name] [name]  Sex: female Age: 58 y.o. DOB: [birth year]  Allergies: Review of patient's allergies indicates no known allergies.  Chief Complaint  Patient presents with  . Follow-up    HPI: Patient is 58 y.o. female who has a strep diabetes, she is accompanied with her son as well as  interpreter, patient denies any hypoglycemic symptoms her fasting sugar is usually more than 1 30 mg/dL, she has been taking Glucotrol as well as on NovoLog 10 units in the morning and 8 units in the evening, she denies any acute symptoms history of CAD following up with the cardiologist denies any chest pain, she was recently started on lisinopril by her cardiologist.  Past Medical History  Diagnosis Date  . CAD (coronary artery disease)     a. 04/2013 Inf STEMI/PCI: LM nl, LAD min irregs, D1/2/3 nl, LCX 28m (2.5x18 Xience DES), OM1/2/3 nl, RCA nl, PDA nl, RPAV nl, RPL nl, EF 55%;  b. 04/2013 Echo: EF 55-60%, no rwma, mild MR, mildly dil LA.  Marland Kitchen Hypertension   . Hyperlipidemia   . Diabetes mellitus type 2, uncontrolled     a. 04/2013 HbA1c = 11.7.    Past Surgical History  Procedure Laterality Date  . Cesarean section    . Left heart cath  05-15-13      Medication List       This list is accurate as of: 07/25/13  4:48 PM.  Always use your most recent med list.               aspirin EC 81 MG tablet  Take 1 tablet (81 mg total) by mouth daily.     atorvastatin 80 MG tablet  Commonly known as:  LIPITOR  Take 1 tablet (80 mg total) by mouth daily at 6 PM.     glipiZIDE 10 MG tablet  Commonly known as:  GLUCOTROL  Take 1 tablet (10 mg total) by mouth 2 (two) times daily before a meal.     insulin NPH-regular Human (70-30) 100 UNIT/ML injection  Commonly known as:  NOVOLIN 70/30  Inject 8-10 Units into the skin 2 (two) times daily with a meal. Injects 10 units in the morning and 8 units in the evening     lisinopril 2.5 MG tablet  Commonly known as:   PRINIVIL,ZESTRIL  Take 1 tablet (2.5 mg total) by mouth daily.     metoprolol tartrate 25 MG tablet  Commonly known as:  LOPRESSOR  Take 0.5 tablets (12.5 mg total) by mouth 2 (two) times daily.     nitroGLYCERIN 0.4 MG SL tablet  Commonly known as:  NITROSTAT  Place 1 tablet (0.4 mg total) under the tongue every 5 (five) minutes as needed for chest pain.     Ticagrelor 90 MG Tabs tablet  Commonly known as:  BRILINTA  Take 1 tablet (90 mg total) by mouth 2 (two) times daily.        No orders of the defined types were placed in this encounter.    Immunization History  Administered Date(s) Administered  . Influenza,inj,Quad PF,36+ Mos 05/16/2013  . Pneumococcal Polysaccharide-23 05/16/2013    Family History  Problem Relation Age of Onset  . Heart disease Mother   . Heart disease Father     History  Substance Use Topics  . Smoking status: Never Smoker   . Smokeless tobacco: Never Used  . Alcohol Use: No    Review of Systems   As noted  in HPI  Filed Vitals:   07/25/13 1625  BP: 144/86  Pulse: 75  Temp: 98.2 F (36.8 C)  Resp: 17    Physical Exam  Physical Exam  Constitutional: No distress.  Eyes: EOM are normal. Pupils are equal, round, and reactive to light.  Cardiovascular: Normal rate and regular rhythm.   Pulmonary/Chest: Breath sounds normal. No respiratory distress. She has no wheezes. She has no rales.  Musculoskeletal: She exhibits no edema.    CBC    Component Value Date/Time   WBC 6.7 06/04/2013 2155   RBC 4.11 06/04/2013 2155   HGB 12.0 06/04/2013 2155   HCT 35.6* 06/04/2013 2155   PLT 248 06/04/2013 2155   MCV 86.6 06/04/2013 2155   LYMPHSABS 2.5 06/04/2013 2155   MONOABS 0.6 06/04/2013 2155   EOSABS 0.2 06/04/2013 2155   BASOSABS 0.0 06/04/2013 2155    CMP     Component Value Date/Time   NA 140 06/04/2013 2155   K 4.2 06/04/2013 2155   CL 106 06/04/2013 2155   CO2 26 06/04/2013 2155   GLUCOSE 127* 06/04/2013 2155   BUN 11  06/04/2013 2155   CREATININE 0.68 06/04/2013 2155   CALCIUM 8.8 06/04/2013 2155   PROT 7.3 06/04/2013 2155   ALBUMIN 3.4* 06/04/2013 2155   AST 28 06/04/2013 2155   ALT 21 06/04/2013 2155   ALKPHOS 58 06/04/2013 2155   BILITOT 0.3 06/04/2013 2155   GFRNONAA >90 06/04/2013 2155   GFRAA >90 06/04/2013 2155    No results found for this basename: chol,  tri,  ldl    No components found with this basename: hga1c    Lab Results  Component Value Date/Time   AST 28 06/04/2013  9:55 PM    Assessment and Plan  DM (diabetes mellitus) - Plan: Glucose (CBG), HgB A1c 7.5% improved, continue with Glucotrol, I have advised to increase insulin to 20 units in the morning and 10 units in the evening and monitor fingerstick glucose at home.  Special screening for malignant neoplasms, colon - Plan: Ambulatory referral to Gastroenterology  Screening - Plan: MM DIGITAL SCREENING BILATERAL  HTN (hypertension) Patient has recently been started on lisinopril, and BP at home and is scheduled to follow with her cardiologist.  Need for Tdap vaccination - Plan: Tdap vaccine greater than or equal to 7yo IM   Health Maintenance -Colonoscopy: referral done  -Pap Smear:  -Mammogram: ordered  -Vaccinations:  -TdAP    Return in about 3 months (around 10/22/2013).  Lorayne Marek, MD

## 2013-08-03 ENCOUNTER — Other Ambulatory Visit: Payer: Self-pay

## 2013-08-04 ENCOUNTER — Other Ambulatory Visit (INDEPENDENT_AMBULATORY_CARE_PROVIDER_SITE_OTHER): Payer: Medicaid Other

## 2013-08-04 DIAGNOSIS — I10 Essential (primary) hypertension: Secondary | ICD-10-CM

## 2013-08-04 DIAGNOSIS — E785 Hyperlipidemia, unspecified: Secondary | ICD-10-CM

## 2013-08-04 DIAGNOSIS — I2510 Atherosclerotic heart disease of native coronary artery without angina pectoris: Secondary | ICD-10-CM

## 2013-08-04 LAB — HEPATIC FUNCTION PANEL
ALT: 22 U/L (ref 0–35)
AST: 24 U/L (ref 0–37)
Albumin: 3.9 g/dL (ref 3.5–5.2)
Alkaline Phosphatase: 51 U/L (ref 39–117)
BILIRUBIN DIRECT: 0.1 mg/dL (ref 0.0–0.3)
BILIRUBIN TOTAL: 0.4 mg/dL (ref 0.3–1.2)
Total Protein: 7.4 g/dL (ref 6.0–8.3)

## 2013-08-04 LAB — LIPID PANEL
CHOL/HDL RATIO: 3
Cholesterol: 98 mg/dL (ref 0–200)
HDL: 35.9 mg/dL — ABNORMAL LOW (ref >39.00)
LDL CALC: 41 mg/dL (ref 0–99)
Triglycerides: 104 mg/dL (ref 0.0–149.0)
VLDL: 20.8 mg/dL (ref 0.0–40.0)

## 2013-08-04 LAB — BASIC METABOLIC PANEL
BUN: 10 mg/dL (ref 6–23)
CALCIUM: 9.7 mg/dL (ref 8.4–10.5)
CHLORIDE: 107 meq/L (ref 96–112)
CO2: 25 meq/L (ref 19–32)
CREATININE: 0.8 mg/dL (ref 0.4–1.2)
GFR: 79.11 mL/min (ref >60.00)
Glucose, Bld: 111 mg/dL — ABNORMAL HIGH (ref 70–99)
Potassium: 4.3 mEq/L (ref 3.5–5.1)
SODIUM: 139 meq/L (ref 135–145)

## 2013-08-05 ENCOUNTER — Telehealth: Payer: Self-pay | Admitting: *Deleted

## 2013-08-05 NOTE — Telephone Encounter (Signed)
lmptcb for lab results 

## 2013-08-10 ENCOUNTER — Encounter: Payer: Self-pay | Admitting: *Deleted

## 2013-08-10 NOTE — Telephone Encounter (Signed)
lmptcb for lab results 

## 2013-09-15 ENCOUNTER — Other Ambulatory Visit: Payer: Self-pay | Admitting: Nurse Practitioner

## 2013-10-24 ENCOUNTER — Telehealth: Payer: Self-pay | Admitting: *Deleted

## 2013-10-24 NOTE — Telephone Encounter (Signed)
PA to [facility] for brilinta

## 2013-11-15 MED ORDER — PRASUGREL HCL 10 MG PO TABS: 10.0000 mg | ORAL_TABLET | Freq: Every day | ORAL | Status: DC

## 2013-11-15 NOTE — Telephone Encounter (Signed)
Notified patients son stop brilinta start effient 10 mg daily to follow medicaid preferred list, approved by Dr Aundra Dubin.

## 2013-11-15 NOTE — Addendum Note (Signed)
Addended by: Fernande Boyden on: 11/15/2013 02:03 PM   Modules accepted: Orders, Medications

## 2013-11-15 NOTE — Telephone Encounter (Signed)
Dr Aundra Dubin, this is a medicaid patient, his brilinta was denied because it is on non-preferred list, preferred list includes:  Effient Ysidro Evert suggests Effient 10 mg daily) Clopidogrel Ysidro Evert said effient preferred over clopidogrel because pt DM) Dipyridamole Ticlopidine  Thanks, Lovett Sox, RN

## 2013-11-15 NOTE — Telephone Encounter (Signed)
Notified. 

## 2013-11-15 NOTE — Telephone Encounter (Signed)
Can stop Brilinta and start Effient 10 mg daily.

## 2013-11-16 ENCOUNTER — Ambulatory Visit: Payer: Medicaid Other | Attending: Internal Medicine | Admitting: Internal Medicine

## 2013-11-16 ENCOUNTER — Encounter: Payer: Self-pay | Admitting: Internal Medicine

## 2013-11-16 VITALS — BP 130/82 | HR 72 | Temp 98.2°F | Resp 17 | Wt 149.2 lb

## 2013-11-16 DIAGNOSIS — IMO0002 Reserved for concepts with insufficient information to code with codable children: Secondary | ICD-10-CM | POA: Diagnosis not present

## 2013-11-16 DIAGNOSIS — Z7982 Long term (current) use of aspirin: Secondary | ICD-10-CM | POA: Insufficient documentation

## 2013-11-16 DIAGNOSIS — Z794 Long term (current) use of insulin: Secondary | ICD-10-CM | POA: Diagnosis not present

## 2013-11-16 DIAGNOSIS — I10 Essential (primary) hypertension: Secondary | ICD-10-CM | POA: Diagnosis not present

## 2013-11-16 DIAGNOSIS — E119 Type 2 diabetes mellitus without complications: Secondary | ICD-10-CM

## 2013-11-16 DIAGNOSIS — E118 Type 2 diabetes mellitus with unspecified complications: Principal | ICD-10-CM

## 2013-11-16 DIAGNOSIS — Z79899 Other long term (current) drug therapy: Secondary | ICD-10-CM | POA: Diagnosis not present

## 2013-11-16 DIAGNOSIS — E1169 Type 2 diabetes mellitus with other specified complication: Secondary | ICD-10-CM | POA: Insufficient documentation

## 2013-11-16 DIAGNOSIS — I2510 Atherosclerotic heart disease of native coronary artery without angina pectoris: Secondary | ICD-10-CM | POA: Diagnosis not present

## 2013-11-16 DIAGNOSIS — E1165 Type 2 diabetes mellitus with hyperglycemia: Secondary | ICD-10-CM | POA: Diagnosis not present

## 2013-11-16 DIAGNOSIS — E785 Hyperlipidemia, unspecified: Secondary | ICD-10-CM | POA: Diagnosis not present

## 2013-11-16 LAB — GLUCOSE, POCT (MANUAL RESULT ENTRY): POC GLUCOSE: 153 mg/dL — AB (ref 70–99)

## 2013-11-16 LAB — POCT GLYCOSYLATED HEMOGLOBIN (HGB A1C): Hemoglobin A1C: 7.6

## 2013-11-16 MED ORDER — INSULIN ASPART PROT & ASPART (70-30 MIX) 100 UNIT/ML PEN: PEN_INJECTOR | SUBCUTANEOUS | Status: DC

## 2013-11-16 MED ORDER — GLIPIZIDE 10 MG PO TABS: 10.0000 mg | ORAL_TABLET | Freq: Two times a day (BID) | ORAL | Status: DC

## 2013-11-16 NOTE — Progress Notes (Signed)
MRN: [id number] Name: [name] [name]  Sex: female Age: 58 y.o. DOB: [birth year]  Allergies: Review of patient's allergies indicates no known allergies.  Chief Complaint  Patient presents with  . Follow-up    HPI: Patient is 58 y.o. female who has to of diabetes hypertension CAD comes today for followup, as per patient for the last one month she has not taken Glucotrol which she ran out, she is taking NovoLog 70/30 10 units twice a day, her fasting sugar is usually around 1 50 mg/dL denies any hypoglycemic symptoms denies any headache dizziness chest and shortness of breath, she follows up with her cardiologist.  Past Medical History  Diagnosis Date  . CAD (coronary artery disease)     a. 04/2013 Inf STEMI/PCI: LM nl, LAD min irregs, D1/2/3 nl, LCX 25m (2.5x18 Xience DES), OM1/2/3 nl, RCA nl, PDA nl, RPAV nl, RPL nl, EF 55%;  b. 04/2013 Echo: EF 55-60%, no rwma, mild MR, mildly dil LA.  Marland Kitchen Hypertension   . Hyperlipidemia   . Diabetes mellitus type 2, uncontrolled     a. 04/2013 HbA1c = 11.7.    Past Surgical History  Procedure Laterality Date  . Cesarean section    . Left heart cath  05-15-13      Medication List       This list is accurate as of: 11/16/13 11:00 AM.  Always use your most recent med list.               aspirin EC 81 MG tablet  Take 1 tablet (81 mg total) by mouth daily.     atorvastatin 80 MG tablet  Commonly known as:  LIPITOR  Take 1 tablet (80 mg total) by mouth daily at 6 PM.     glipiZIDE 10 MG tablet  Commonly known as:  GLUCOTROL  Take 1 tablet (10 mg total) by mouth 2 (two) times daily before a meal.     Insulin Aspart Prot & Aspart (70-30) 100 UNIT/ML Pen  Commonly known as:  NOVOLOG 70/30 MIX  Inject 12 units in the am and 10 units in the pm     insulin NPH-regular Human (70-30) 100 UNIT/ML injection  Commonly known as:  NOVOLIN 70/30  Inject 8-10 Units into the skin 2 (two) times daily with a meal. Injects 10 units in the morning  and 8 units in the evening     lisinopril 2.5 MG tablet  Commonly known as:  PRINIVIL,ZESTRIL  Take 1 tablet (2.5 mg total) by mouth daily.     metoprolol tartrate 25 MG tablet  Commonly known as:  LOPRESSOR  Take 0.5 tablets (12.5 mg total) by mouth 2 (two) times daily.     nitroGLYCERIN 0.4 MG SL tablet  Commonly known as:  NITROSTAT  Place 1 tablet (0.4 mg total) under the tongue every 5 (five) minutes as needed for chest pain.     prasugrel 10 MG Tabs tablet  Commonly known as:  EFFIENT  Take 1 tablet (10 mg total) by mouth daily.        Meds ordered this encounter  Medications  . glipiZIDE (GLUCOTROL) 10 MG tablet    Sig: Take 1 tablet (10 mg total) by mouth 2 (two) times daily before a meal.    Dispense:  60 tablet    Refill:  3    Order Specific Question:  Supervising Provider    Answer:  COOPER, MICHAEL [[id number]]  . Insulin Aspart Prot & Aspart (NOVOLOG 70/30 MIX) (  70-30) 100 UNIT/ML Pen    Sig: Inject 12 units in the am and 10 units in the pm    Dispense:  15 mL    Refill:  11    Immunization History  Administered Date(s) Administered  . Influenza,inj,Quad PF,36+ Mos 05/16/2013  . Pneumococcal Polysaccharide-23 05/16/2013  . Tdap 07/25/2013    Family History  Problem Relation Age of Onset  . Heart disease Mother   . Heart disease Father     History  Substance Use Topics  . Smoking status: Never Smoker   . Smokeless tobacco: Never Used  . Alcohol Use: No    Review of Systems   As noted in HPI  Filed Vitals:   11/16/13 1036  BP: 130/82  Pulse: 72  Temp: 98.2 F (36.8 C)  Resp: 17    Physical Exam  Physical Exam  Constitutional: No distress.  Eyes: EOM are normal. Pupils are equal, round, and reactive to light.  Cardiovascular: Normal rate and regular rhythm.   Pulmonary/Chest: Breath sounds normal. No respiratory distress. She has no wheezes. She has no rales.    CBC    Component Value Date/Time   WBC 6.7 06/04/2013 2155   RBC 4.11  06/04/2013 2155   HGB 12.0 06/04/2013 2155   HCT 35.6* 06/04/2013 2155   PLT 248 06/04/2013 2155   MCV 86.6 06/04/2013 2155   LYMPHSABS 2.5 06/04/2013 2155   MONOABS 0.6 06/04/2013 2155   EOSABS 0.2 06/04/2013 2155   BASOSABS 0.0 06/04/2013 2155    CMP     Component Value Date/Time   NA 139 08/04/2013 0904   K 4.3 08/04/2013 0904   CL 107 08/04/2013 0904   CO2 25 08/04/2013 0904   GLUCOSE 111* 08/04/2013 0904   BUN 10 08/04/2013 0904   CREATININE 0.8 08/04/2013 0904   CALCIUM 9.7 08/04/2013 0904   PROT 7.4 08/04/2013 0904   ALBUMIN 3.9 08/04/2013 0904   AST 24 08/04/2013 0904   ALT 22 08/04/2013 0904   ALKPHOS 51 08/04/2013 0904   BILITOT 0.4 08/04/2013 0904   GFRNONAA >90 06/04/2013 2155   GFRAA >90 06/04/2013 2155    Lab Results  Component Value Date/Time   CHOL 98 08/04/2013  9:04 AM    No components found with this basename: hga1c    Lab Results  Component Value Date/Time   AST 24 08/04/2013  9:04 AM    Assessment and Plan  DM (diabetes mellitus) - Plan:  Results for orders placed in visit on 11/16/13  GLUCOSE, POCT (MANUAL RESULT ENTRY)      Result Value Ref Range   POC Glucose 153 (*) 70 - 99 mg/dl  POCT GLYCOSYLATED HEMOGLOBIN (HGB A1C)      Result Value Ref Range   Hemoglobin A1C 7.6     Resume back on glipiZIDE (GLUCOTROL) 10 MG tablet, I have increased the dose of Insulin Aspart Prot & Aspart (NOVOLOG 70/30 MIX) (70-30) 100 UNIT/ML Pen to 12 units 2 times a day, advise patient to keep the fingerstick log and can increase the dose to 13 units twice a day if her fasting sugar is persistently more than 130 milligrams a deciliter.  will repeat A1c on the next visit  HTN (hypertension) Well controlled continue with lisinopril and metoprolol.  CAD (coronary artery disease) Patient on aspirin statin beta blocker and effient   Return in about 3 months (around 02/16/2014) for diabetes, hypertension.  Lorayne Marek, MD

## 2013-11-16 NOTE — Progress Notes (Signed)
Patient here for follow up on her DM 

## 2014-01-04 ENCOUNTER — Other Ambulatory Visit: Payer: Self-pay | Admitting: Nurse Practitioner

## 2014-02-16 ENCOUNTER — Ambulatory Visit: Payer: Medicaid Other | Attending: Internal Medicine | Admitting: Internal Medicine

## 2014-02-16 ENCOUNTER — Encounter: Payer: Self-pay | Admitting: Internal Medicine

## 2014-02-16 VITALS — BP 130/80 | HR 73 | Temp 98.1°F | Resp 16 | Wt 151.0 lb

## 2014-02-16 DIAGNOSIS — E785 Hyperlipidemia, unspecified: Secondary | ICD-10-CM | POA: Diagnosis not present

## 2014-02-16 DIAGNOSIS — E089 Diabetes mellitus due to underlying condition without complications: Secondary | ICD-10-CM

## 2014-02-16 DIAGNOSIS — I2510 Atherosclerotic heart disease of native coronary artery without angina pectoris: Secondary | ICD-10-CM | POA: Diagnosis not present

## 2014-02-16 DIAGNOSIS — E119 Type 2 diabetes mellitus without complications: Secondary | ICD-10-CM | POA: Diagnosis present

## 2014-02-16 DIAGNOSIS — I10 Essential (primary) hypertension: Secondary | ICD-10-CM | POA: Diagnosis not present

## 2014-02-16 DIAGNOSIS — I25119 Atherosclerotic heart disease of native coronary artery with unspecified angina pectoris: Secondary | ICD-10-CM

## 2014-02-16 DIAGNOSIS — I209 Angina pectoris, unspecified: Secondary | ICD-10-CM

## 2014-02-16 DIAGNOSIS — E139 Other specified diabetes mellitus without complications: Secondary | ICD-10-CM

## 2014-02-16 LAB — POCT GLYCOSYLATED HEMOGLOBIN (HGB A1C): Hemoglobin A1C: 7.8

## 2014-02-16 LAB — GLUCOSE, POCT (MANUAL RESULT ENTRY): POC Glucose: 153 mg/dl — AB (ref 70–99)

## 2014-02-16 NOTE — Progress Notes (Signed)
MRN: [id number] Name: [name] [name]  Sex: female Age: 58 y.o. DOB: Mar 15, 1956  Allergies: Review of patient's allergies indicates no known allergies.  Chief Complaint  Patient presents with  . Follow-up    HPI: Patient is 58 y.o. female who has history of diabetes hypertension CAD hyperlipidemia comes today for followup, she denies any chest and shortness of breath orthopnea or PND, has been taking her medication, as per patient Glucotrol she was taking one time a day, she was prescribed to take it 2 times a day but for the last one week she has started taking 2 times, she denies any hypoglycemic symptoms currently she's also taking insulin 70/30  12 units twice a day, her A1c is still slightly trended up.  Past Medical History  Diagnosis Date  . CAD (coronary artery disease)     a. 04/2013 Inf STEMI/PCI: LM nl, LAD min irregs, D1/2/3 nl, LCX 58m (2.5x18 Xience DES), OM1/2/3 nl, RCA nl, PDA nl, RPAV nl, RPL nl, EF 55%;  b. 04/2013 Echo: EF 55-60%, no rwma, mild MR, mildly dil LA.  Marland Kitchen Hypertension   . Hyperlipidemia   . Diabetes mellitus type 2, uncontrolled     a. 04/2013 HbA1c = 11.7.    Past Surgical History  Procedure Laterality Date  . Cesarean section    . Left heart cath  05-15-13      Medication List       This list is accurate as of: 02/16/14 10:32 AM.  Always use your most recent med list.               aspirin EC 81 MG tablet  Take 1 tablet (81 mg total) by mouth daily.     atorvastatin 80 MG tablet  Commonly known as:  LIPITOR  take 1 tablet by mouth daily AT 6PM     glipiZIDE 10 MG tablet  Commonly known as:  GLUCOTROL  Take 1 tablet (10 mg total) by mouth 2 (two) times daily before a meal.     Insulin Aspart Prot & Aspart (70-30) 100 UNIT/ML Pen  Commonly known as:  NOVOLOG 70/30 MIX  Inject 12 units in the am and 10 units in the pm     insulin NPH-regular Human (70-30) 100 UNIT/ML injection  Commonly known as:  NOVOLIN 70/30  Inject 8-10  Units into the skin 2 (two) times daily with a meal. Injects 10 units in the morning and 8 units in the evening     lisinopril 2.5 MG tablet  Commonly known as:  PRINIVIL,ZESTRIL  Take 1 tablet (2.5 mg total) by mouth daily.     metoprolol tartrate 25 MG tablet  Commonly known as:  LOPRESSOR  Take 0.5 tablets (12.5 mg total) by mouth 2 (two) times daily.     nitroGLYCERIN 0.4 MG SL tablet  Commonly known as:  NITROSTAT  Place 1 tablet (0.4 mg total) under the tongue every 5 (five) minutes as needed for chest pain.     prasugrel 10 MG Tabs tablet  Commonly known as:  EFFIENT  Take 1 tablet (10 mg total) by mouth daily.        No orders of the defined types were placed in this encounter.    Immunization History  Administered Date(s) Administered  . Influenza,inj,Quad PF,36+ Mos 05/16/2013  . Pneumococcal Polysaccharide-23 05/16/2013  . Tdap 07/25/2013    Family History  Problem Relation Age of Onset  . Heart disease Mother   . Heart disease Father  History  Substance Use Topics  . Smoking status: Never Smoker   . Smokeless tobacco: Never Used  . Alcohol Use: No    Review of Systems   As noted in HPI  Filed Vitals:   02/16/14 1023  BP: 130/80  Pulse:   Temp:   Resp:     Physical Exam  Physical Exam  Constitutional: No distress.  Eyes: EOM are normal. Pupils are equal, round, and reactive to light.  Cardiovascular: Normal rate and regular rhythm.   Pulmonary/Chest: Breath sounds normal. No respiratory distress. She has no wheezes. She has no rales.  Musculoskeletal: She exhibits no edema.    CBC    Component Value Date/Time   WBC 6.7 06/04/2013 2155   RBC 4.11 06/04/2013 2155   HGB 12.0 06/04/2013 2155   HCT 35.6* 06/04/2013 2155   PLT 248 06/04/2013 2155   MCV 86.6 06/04/2013 2155   LYMPHSABS 2.5 06/04/2013 2155   MONOABS 0.6 06/04/2013 2155   EOSABS 0.2 06/04/2013 2155   BASOSABS 0.0 06/04/2013 2155    CMP     Component Value  Date/Time   NA 139 08/04/2013 0904   K 4.3 08/04/2013 0904   CL 107 08/04/2013 0904   CO2 25 08/04/2013 0904   GLUCOSE 111* 08/04/2013 0904   BUN 10 08/04/2013 0904   CREATININE 0.8 08/04/2013 0904   CALCIUM 9.7 08/04/2013 0904   PROT 7.4 08/04/2013 0904   ALBUMIN 3.9 08/04/2013 0904   AST 24 08/04/2013 0904   ALT 22 08/04/2013 0904   ALKPHOS 51 08/04/2013 0904   BILITOT 0.4 08/04/2013 0904   GFRNONAA >90 06/04/2013 2155   GFRAA >90 06/04/2013 2155    Lab Results  Component Value Date/Time   CHOL 98 08/04/2013  9:04 AM    No components found with this basename: hga1c    Lab Results  Component Value Date/Time   AST 24 08/04/2013  9:04 AM    Assessment and Plan  Diabetes mellitus due to underlying condition without complications - Plan:  Results for orders placed in visit on 02/16/14  GLUCOSE, POCT (MANUAL RESULT ENTRY)      Result Value Ref Range   POC Glucose 153 (*) 70 - 99 mg/dl  POCT GLYCOSYLATED HEMOGLOBIN (HGB A1C)      Result Value Ref Range   Hemoglobin A1C 7.8     Last HgB A1c was 7.6%, today it is 7.8%, patient was not taking Glucotrol twice a day, advise patient to take it 2 times a day and she can increase the dose of insulin to 13 units twice a day, advise patient for diabetes meal planning.  Essential hypertension Advised for Dash diet continue with current meds  CAD  Patient to follow up with her cardiologist.    Burnis Medin do fasting blood work on the next visit.  Return in about 3 months (around 05/18/2014) for diabetes, hypertension, hyperipidemia.  Lorayne Marek, MD

## 2014-02-16 NOTE — Progress Notes (Signed)
Patient here for follow up on her dm Here with interpreter

## 2014-02-16 NOTE — Patient Instructions (Signed)
Diabetes Mellitus and Food It is important for you to manage your blood sugar (glucose) level. Your blood glucose level can be greatly affected by what you eat. Eating healthier foods in the appropriate amounts throughout the day at about the same time each day will help you control your blood glucose level. It can also help slow or prevent worsening of your diabetes mellitus. Healthy eating may even help you improve the level of your blood pressure and reach or maintain a healthy weight.  HOW CAN FOOD AFFECT ME? Carbohydrates Carbohydrates affect your blood glucose level more than any other type of food. Your dietitian will help you determine how many carbohydrates to eat at each meal and teach you how to count carbohydrates. Counting carbohydrates is important to keep your blood glucose at a healthy level, especially if you are using insulin or taking certain medicines for diabetes mellitus. Alcohol Alcohol can cause sudden decreases in blood glucose (hypoglycemia), especially if you use insulin or take certain medicines for diabetes mellitus. Hypoglycemia can be a life-threatening condition. Symptoms of hypoglycemia (sleepiness, dizziness, and disorientation) are similar to symptoms of having too much alcohol.  If your health care provider has given you approval to drink alcohol, do so in moderation and use the following guidelines:  Women should not have more than one drink per day, and men should not have more than two drinks per day. One drink is equal to:  12 oz of beer.  5 oz of wine.  1 oz of hard liquor.  Do not drink on an empty stomach.  Keep yourself hydrated. Have water, diet soda, or unsweetened iced tea.  Regular soda, juice, and other mixers might contain a lot of carbohydrates and should be counted. WHAT FOODS ARE NOT RECOMMENDED? As you make food choices, it is important to remember that all foods are not the same. Some foods have fewer nutrients per serving than other  foods, even though they might have the same number of calories or carbohydrates. It is difficult to get your body what it needs when you eat foods with fewer nutrients. Examples of foods that you should avoid that are high in calories and carbohydrates but low in nutrients include:  Trans fats (most processed foods list trans fats on the Nutrition Facts label).  Regular soda.  Juice.  Candy.  Sweets, such as cake, pie, doughnuts, and cookies.  Fried foods. WHAT FOODS CAN I EAT? Have nutrient-rich foods, which will nourish your body and keep you healthy. The food you should eat also will depend on several factors, including:  The calories you need.  The medicines you take.  Your weight.  Your blood glucose level.  Your blood pressure level.  Your cholesterol level. You also should eat a variety of foods, including:  Protein, such as meat, poultry, fish, tofu, nuts, and seeds (lean animal proteins are best).  Fruits.  Vegetables.  Dairy products, such as milk, cheese, and yogurt (low fat is best).  Breads, grains, pasta, cereal, rice, and beans.  Fats such as olive oil, trans fat-free margarine, canola oil, avocado, and olives. DOES EVERYONE WITH DIABETES MELLITUS HAVE THE SAME MEAL PLAN? Because every person with diabetes mellitus is different, there is not one meal plan that works for everyone. It is very important that you meet with a dietitian who will help you create a meal plan that is just right for you. Document Released: 02/27/2005 Document Revised: 06/07/2013 Document Reviewed: 04/29/2013 [employer] Patient Information 2015 [employer]. This   information is not intended to replace advice given to you by your health care provider. Make sure you discuss any questions you have with your health care provider.  

## 2014-03-11 ENCOUNTER — Other Ambulatory Visit: Payer: Self-pay | Admitting: Cardiology

## 2014-04-16 ENCOUNTER — Other Ambulatory Visit: Payer: Self-pay | Admitting: Cardiology

## 2014-05-01 ENCOUNTER — Other Ambulatory Visit: Payer: Self-pay | Admitting: Cardiology

## 2014-05-09 ENCOUNTER — Telehealth: Payer: Self-pay | Admitting: Internal Medicine

## 2014-05-09 NOTE — Telephone Encounter (Signed)
Pt's daughter called to request a refill for her glipiZIDE (GLUCOTROL) 10 MG tablet. Pt's daughter states that pt. Ran out of medication last week and she would like to get enough to last. Please f/u with pt.

## 2014-05-18 ENCOUNTER — Encounter: Payer: Self-pay | Admitting: Internal Medicine

## 2014-05-18 ENCOUNTER — Other Ambulatory Visit: Payer: Self-pay

## 2014-05-18 ENCOUNTER — Ambulatory Visit: Payer: Medicaid Other | Attending: Internal Medicine | Admitting: Internal Medicine

## 2014-05-18 ENCOUNTER — Ambulatory Visit (HOSPITAL_BASED_OUTPATIENT_CLINIC_OR_DEPARTMENT_OTHER): Payer: Medicaid Other

## 2014-05-18 ENCOUNTER — Ambulatory Visit (HOSPITAL_COMMUNITY)
Admission: RE | Admit: 2014-05-18 | Discharge: 2014-05-18 | Disposition: A | Discharge status: Home / Self Care | Payer: Medicaid Other | Source: Ambulatory Visit | Attending: Internal Medicine | Admitting: Internal Medicine

## 2014-05-18 VITALS — BP 130/70 | HR 82 | Temp 98.0°F | Resp 16 | Wt 153.4 lb

## 2014-05-18 DIAGNOSIS — Z23 Encounter for immunization: Secondary | ICD-10-CM

## 2014-05-18 DIAGNOSIS — Z9861 Coronary angioplasty status: Secondary | ICD-10-CM | POA: Diagnosis not present

## 2014-05-18 DIAGNOSIS — Z7902 Long term (current) use of antithrombotics/antiplatelets: Secondary | ICD-10-CM | POA: Diagnosis not present

## 2014-05-18 DIAGNOSIS — I252 Old myocardial infarction: Secondary | ICD-10-CM | POA: Insufficient documentation

## 2014-05-18 DIAGNOSIS — I10 Essential (primary) hypertension: Secondary | ICD-10-CM | POA: Insufficient documentation

## 2014-05-18 DIAGNOSIS — M25562 Pain in left knee: Secondary | ICD-10-CM

## 2014-05-18 DIAGNOSIS — I2510 Atherosclerotic heart disease of native coronary artery without angina pectoris: Secondary | ICD-10-CM

## 2014-05-18 DIAGNOSIS — E785 Hyperlipidemia, unspecified: Secondary | ICD-10-CM | POA: Diagnosis not present

## 2014-05-18 DIAGNOSIS — E119 Type 2 diabetes mellitus without complications: Secondary | ICD-10-CM | POA: Diagnosis present

## 2014-05-18 DIAGNOSIS — Z7982 Long term (current) use of aspirin: Secondary | ICD-10-CM | POA: Diagnosis not present

## 2014-05-18 DIAGNOSIS — E139 Other specified diabetes mellitus without complications: Secondary | ICD-10-CM

## 2014-05-18 DIAGNOSIS — Z794 Long term (current) use of insulin: Secondary | ICD-10-CM | POA: Insufficient documentation

## 2014-05-18 LAB — GLUCOSE, POCT (MANUAL RESULT ENTRY)
POC GLUCOSE: 309 mg/dL — AB (ref 70–99)
POC Glucose: 257 mg/dl — AB (ref 70–99)

## 2014-05-18 LAB — POCT GLYCOSYLATED HEMOGLOBIN (HGB A1C): HEMOGLOBIN A1C: 8.2

## 2014-05-18 MED ORDER — ATORVASTATIN CALCIUM 80 MG PO TABS: ORAL_TABLET | ORAL | Status: DC

## 2014-05-18 MED ORDER — INSULIN ASPART 100 UNIT/ML ~~LOC~~ SOLN
10.0000 [IU] | Freq: Once | SUBCUTANEOUS | Status: AC
  Administered 2014-05-18: 10 [IU] via SUBCUTANEOUS

## 2014-05-18 MED ORDER — GLIPIZIDE 10 MG PO TABS: 10.0000 mg | ORAL_TABLET | Freq: Two times a day (BID) | ORAL | Status: DC

## 2014-05-18 NOTE — Progress Notes (Signed)
Patient here for follow up on her diabetes and cholesterol Patient is here with an interpreter

## 2014-05-18 NOTE — Progress Notes (Signed)
MRN: [id number] Name: [name] [name]  Sex: female Age: 58 y.o. DOB: 1955-11-27  Allergies: Review of patient's allergies indicates no known allergies.  Chief Complaint  Patient presents with  . Follow-up    diabetes    HPI: Patient is 58 y.o. female who has history of hypertension diabetes, CAD comes today for followup her, as per patient she ran out of her diabetes medication Glucotrol for the last 2 weeks but has been taking her insulin currently taking 12 units 2 times a day, she was advised on the last visit increased the dose to 13 units, today noticed her hemoglobin A1c has trended up, she denies any headache dizziness chest and shortness of breath she also takes atorvastatin which she was prescribed from her cardiologist as per patient she needs a refill, I have advised patient to make a followup appointment with her cardiologist and she will be given one more refill on the medication until she is seen by her cardiologist, she also reported to have left knee pain on and off she denies any recent fall or trauma.  Past Medical History  Diagnosis Date  . CAD (coronary artery disease)     a. 04/2013 Inf STEMI/PCI: LM nl, LAD min irregs, D1/2/3 nl, LCX 58m (2.5x18 Xience DES), OM1/2/3 nl, RCA nl, PDA nl, RPAV nl, RPL nl, EF 55%;  b. 04/2013 Echo: EF 55-60%, no rwma, mild MR, mildly dil LA.  Marland Kitchen Hypertension   . Hyperlipidemia   . Diabetes mellitus type 2, uncontrolled     a. 04/2013 HbA1c = 11.7.    Past Surgical History  Procedure Laterality Date  . Cesarean section    . Left heart cath  05-15-13      Medication List       This list is accurate as of: 05/18/14 11:22 AM.  Always use your most recent med list.               aspirin EC 81 MG tablet  Take 1 tablet (81 mg total) by mouth daily.     atorvastatin 80 MG tablet  Commonly known as:  LIPITOR  take 1 tablet by mouth once daily AT 6PM     glipiZIDE 10 MG tablet  Commonly known as:  GLUCOTROL  Take 1 tablet  (10 mg total) by mouth 2 (two) times daily before a meal.     Insulin Aspart Prot & Aspart (70-30) 100 UNIT/ML Pen  Commonly known as:  NOVOLOG 70/30 MIX  Inject 12 units in the am and 10 units in the pm     insulin NPH-regular Human (70-30) 100 UNIT/ML injection  Commonly known as:  NOVOLIN 70/30  Inject 8-10 Units into the skin 2 (two) times daily with a meal. Injects 10 units in the morning and 8 units in the evening     lisinopril 2.5 MG tablet  Commonly known as:  PRINIVIL,ZESTRIL  Take 1 tablet (2.5 mg total) by mouth daily.     metoprolol tartrate 25 MG tablet  Commonly known as:  LOPRESSOR  Take 0.5 tablets (12.5 mg total) by mouth 2 (two) times daily.     nitroGLYCERIN 0.4 MG SL tablet  Commonly known as:  NITROSTAT  Place 1 tablet (0.4 mg total) under the tongue every 5 (five) minutes as needed for chest pain.     prasugrel 10 MG Tabs tablet  Commonly known as:  EFFIENT  Take 1 tablet (10 mg total) by mouth daily.  Meds ordered this encounter  Medications  . insulin aspart (novoLOG) injection 10 Units    Sig:   . glipiZIDE (GLUCOTROL) 10 MG tablet    Sig: Take 1 tablet (10 mg total) by mouth 2 (two) times daily before a meal.    Dispense:  60 tablet    Refill:  3  . atorvastatin (LIPITOR) 80 MG tablet    Sig: take 1 tablet by mouth once daily AT 6PM    Dispense:  30 tablet    Refill:  0    **PATIENT NEEDS APPOINTMENT FOR FURTHER REFILLS**    Immunization History  Administered Date(s) Administered  . Influenza,inj,Quad PF,36+ Mos 05/16/2013, 05/18/2014  . Pneumococcal Polysaccharide-23 05/16/2013  . Tdap 07/25/2013    Family History  Problem Relation Age of Onset  . Heart disease Mother   . Heart disease Father     History  Substance Use Topics  . Smoking status: Never Smoker   . Smokeless tobacco: Never Used  . Alcohol Use: No    Review of Systems   As noted in HPI  Filed Vitals:   05/18/14 1113  BP: 130/70  Pulse:   Temp:     Resp:     Physical Exam  Physical Exam  Constitutional: No distress.  Eyes: EOM are normal. Pupils are equal, round, and reactive to light.  Cardiovascular: Normal rate and regular rhythm.   Pulmonary/Chest: Breath sounds normal. No respiratory distress. She has no wheezes. She has no rales.  Musculoskeletal:  Left knee tenderness crepitation+ve    CBC    Component Value Date/Time   WBC 6.7 06/04/2013 2155   RBC 4.11 06/04/2013 2155   HGB 12.0 06/04/2013 2155   HCT 35.6* 06/04/2013 2155   PLT 248 06/04/2013 2155   MCV 86.6 06/04/2013 2155   LYMPHSABS 2.5 06/04/2013 2155   MONOABS 0.6 06/04/2013 2155   EOSABS 0.2 06/04/2013 2155   BASOSABS 0.0 06/04/2013 2155    CMP     Component Value Date/Time   NA 139 08/04/2013 0904   K 4.3 08/04/2013 0904   CL 107 08/04/2013 0904   CO2 25 08/04/2013 0904   GLUCOSE 111* 08/04/2013 0904   BUN 10 08/04/2013 0904   CREATININE 0.8 08/04/2013 0904   CALCIUM 9.7 08/04/2013 0904   PROT 7.4 08/04/2013 0904   ALBUMIN 3.9 08/04/2013 0904   AST 24 08/04/2013 0904   ALT 22 08/04/2013 0904   ALKPHOS 51 08/04/2013 0904   BILITOT 0.4 08/04/2013 0904   GFRNONAA >90 06/04/2013 2155   GFRAA >90 06/04/2013 2155    Lab Results  Component Value Date/Time   CHOL 98 08/04/2013 09:04 AM     No components found for: HGA1C  Lab Results  Component Value Date/Time   AST 24 08/04/2013 09:04 AM    Assessment and Plan  Other specified diabetes mellitus without complications - Plan:  Results for orders placed or performed in visit on 05/18/14  Glucose (CBG)  Result Value Ref Range   POC Glucose 309 (A) 70 - 99 mg/dl  HgB A1c  Result Value Ref Range   Hemoglobin A1C 8.2   Glucose (CBG)  Result Value Ref Range   POC Glucose 257 (A) 70 - 99 mg/dl    Today her blood sugar was elevated , she was given NovoLog and her repeat blood sugar is improved, I have advised patient to increase the dose of insulin to 13 units 2 times a day and she  can titrate up if  her fasting sugar is still more than 130 milligrams a deciliter, resume back on Glucotrol, insulin aspart (novoLOG) injection 10 Units, glipiZIDE (GLUCOTROL) 10 MG tablet, Glucose (CBG)  Essential hypertension Blood pressure is well controlled, manual blood pressure is 130/70, currently on lisinopril 2.5 mg, metoprolol 25 mg twice a day  Coronary artery disease involving native coronary artery of native heart without angina pectoris - Plan: Patient is taking aspirin, ACE inhibitor, beta blocker, effient , she is given refill on atorvastatin (LIPITOR) 80 MG tablet, advise patient to have a followup with her cardiologist.  Left knee pain - Plan: ordered DG Knee Complete 4 Views Left, she can take Tylenol when necessary  Needs flu shot Flu shot given today.  Health Maintenance  -Vaccinations: Flu shot today   Return in about 3 months (around 08/17/2014) for diabetes, hypertension.  Lorayne Marek, MD

## 2014-05-18 NOTE — Patient Instructions (Signed)
Diabetes Mellitus and Food It is important for you to manage your blood sugar (glucose) level. Your blood glucose level can be greatly affected by what you eat. Eating healthier foods in the appropriate amounts throughout the day at about the same time each day will help you control your blood glucose level. It can also help slow or prevent worsening of your diabetes mellitus. Healthy eating may even help you improve the level of your blood pressure and reach or maintain a healthy weight.  HOW CAN FOOD AFFECT ME? Carbohydrates Carbohydrates affect your blood glucose level more than any other type of food. Your dietitian will help you determine how many carbohydrates to eat at each meal and teach you how to count carbohydrates. Counting carbohydrates is important to keep your blood glucose at a healthy level, especially if you are using insulin or taking certain medicines for diabetes mellitus. Alcohol Alcohol can cause sudden decreases in blood glucose (hypoglycemia), especially if you use insulin or take certain medicines for diabetes mellitus. Hypoglycemia can be a life-threatening condition. Symptoms of hypoglycemia (sleepiness, dizziness, and disorientation) are similar to symptoms of having too much alcohol.  If your health care provider has given you approval to drink alcohol, do so in moderation and use the following guidelines:  Women should not have more than one drink per day, and men should not have more than two drinks per day. One drink is equal to:  12 oz of beer.  5 oz of wine.  1 oz of hard liquor.  Do not drink on an empty stomach.  Keep yourself hydrated. Have water, diet soda, or unsweetened iced tea.  Regular soda, juice, and other mixers might contain a lot of carbohydrates and should be counted. WHAT FOODS ARE NOT RECOMMENDED? As you make food choices, it is important to remember that all foods are not the same. Some foods have fewer nutrients per serving than other  foods, even though they might have the same number of calories or carbohydrates. It is difficult to get your body what it needs when you eat foods with fewer nutrients. Examples of foods that you should avoid that are high in calories and carbohydrates but low in nutrients include:  Trans fats (most processed foods list trans fats on the Nutrition Facts label).  Regular soda.  Juice.  Candy.  Sweets, such as cake, pie, doughnuts, and cookies.  Fried foods. WHAT FOODS CAN I EAT? Have nutrient-rich foods, which will nourish your body and keep you healthy. The food you should eat also will depend on several factors, including:  The calories you need.  The medicines you take.  Your weight.  Your blood glucose level.  Your blood pressure level.  Your cholesterol level. You also should eat a variety of foods, including:  Protein, such as meat, poultry, fish, tofu, nuts, and seeds (lean animal proteins are best).  Fruits.  Vegetables.  Dairy products, such as milk, cheese, and yogurt (low fat is best).  Breads, grains, pasta, cereal, rice, and beans.  Fats such as olive oil, trans fat-free margarine, canola oil, avocado, and olives. DOES EVERYONE WITH DIABETES MELLITUS HAVE THE SAME MEAL PLAN? Because every person with diabetes mellitus is different, there is not one meal plan that works for everyone. It is very important that you meet with a dietitian who will help you create a meal plan that is just right for you. Document Released: 02/27/2005 Document Revised: 06/07/2013 Document Reviewed: 04/29/2013 [employer] Patient Information 2015 [employer]. This   information is not intended to replace advice given to you by your health care provider. Make sure you discuss any questions you have with your health care provider.  

## 2014-05-22 ENCOUNTER — Telehealth: Payer: Self-pay

## 2014-05-22 NOTE — Telephone Encounter (Signed)
Patient calling to check on her inlaw x ray report  At this time the results have not been reviewed Patient is aware we will call once the x ray has been resulted

## 2014-05-25 ENCOUNTER — Encounter (HOSPITAL_COMMUNITY): Payer: Self-pay | Admitting: Cardiovascular Disease

## 2014-06-06 ENCOUNTER — Other Ambulatory Visit: Payer: Self-pay | Admitting: Physician Assistant

## 2014-06-06 ENCOUNTER — Telehealth: Payer: Self-pay | Admitting: Emergency Medicine

## 2014-06-06 NOTE — Telephone Encounter (Signed)
-----   Message from Lorayne Marek, MD sent at 05/19/2014 12:46 PM EST ----- Call and let the patient know that her x-ray reported  Osteoarthritis, no fracture or dislocation.

## 2014-06-28 ENCOUNTER — Ambulatory Visit (INDEPENDENT_AMBULATORY_CARE_PROVIDER_SITE_OTHER): Payer: Medicaid Other | Admitting: Cardiology

## 2014-06-28 ENCOUNTER — Encounter: Payer: Self-pay | Admitting: Cardiology

## 2014-06-28 VITALS — BP 160/84 | HR 83 | Ht 59.0 in | Wt 150.4 lb

## 2014-06-28 DIAGNOSIS — E785 Hyperlipidemia, unspecified: Secondary | ICD-10-CM

## 2014-06-28 DIAGNOSIS — I10 Essential (primary) hypertension: Secondary | ICD-10-CM

## 2014-06-28 DIAGNOSIS — I2510 Atherosclerotic heart disease of native coronary artery without angina pectoris: Secondary | ICD-10-CM

## 2014-06-28 MED ORDER — CLOPIDOGREL BISULFATE 75 MG PO TABS
75.0000 mg | ORAL_TABLET | Freq: Every day | ORAL | Status: DC
Start: 2014-06-28 — End: 2014-12-20

## 2014-06-28 MED ORDER — LISINOPRIL 10 MG PO TABS: 10.0000 mg | ORAL_TABLET | Freq: Every day | ORAL | Status: DC

## 2014-06-28 NOTE — Patient Instructions (Addendum)
Stop Effient.   Start Plavix 75mg  daily. This will be in the place of Effient.   Increase lisinopril to 10mg  daily.   You have been referred to Dr Dwyane Dee for management of diabetes.   Your physician recommends that you return for lab work in: about 10 days--BMET/Lipid profile/CBCd  Your physician wants you to follow-up in: 6 months with Dr Aundra Dubin. (July 2016).  You will receive a reminder letter in the mail two months in advance. If you don't receive a letter, please call our office to schedule the follow-up appointment.

## 2014-06-29 NOTE — Progress Notes (Signed)
Patient ID: [name] [name], female   DOB: [birth year], 59 y.o.   MRN: [id number] PCP: Dr. Annitta Needs  59 yo with history diabetes, HTN, and CAD presents for cardiology followup.  She is [place] and speaks little [place].  There is an interpreter today.  Patient was admitted in 11/14 with inferior STEMI.  She had DES to mLCx.  EF was preserved. She has not had any chest pain.  She does not get exertional dyspnea.  She walks in her house or in stores without trouble other than knee pain from osteoarthritis.  BP is high today.  Blood glucose control continues to be a challenge.     Labs (12/14); K 4.2, creatinine 0.68, BNP 352, HCT 35.6 Labs (2/15): LDL 41, HDL 36, K 4.3, creatinine 0.8  PMH: 1. Type II diabetes 2. HTN 3. CAD: Inferior STEMI 11/14.  LHC (11/14) with 99% mLCx stenosis treated with Xience DES.  EF 55%.  Echo (11/14) with EF 55-60%, mild LVH, mild MR.  4. Hyperlipidemia 5. OA left knee 6. HTN  SH: Lives with daughter, nonsmoker.  From [place], speaks little [place].   FH: Parents with CAD.  ROS: All systems reviewed and negative except as per HPI.   Current Outpatient Prescriptions  Medication Sig Dispense Refill  . aspirin EC 81 MG tablet Take 1 tablet (81 mg total) by mouth daily.    Marland Kitchen atorvastatin (LIPITOR) 80 MG tablet take 1 tablet by mouth once daily AT 6PM 30 tablet 0  . glipiZIDE (GLUCOTROL) 10 MG tablet Take 1 tablet (10 mg total) by mouth 2 (two) times daily before a meal. 60 tablet 3  . Insulin Aspart Prot & Aspart (NOVOLOG 70/30 MIX) (70-30) 100 UNIT/ML Pen Inject 12 units in the am and 10 units in the pm 15 mL 11  . insulin NPH-regular (NOVOLIN 70/30) (70-30) 100 UNIT/ML injection Inject 8-10 Units into the skin 2 (two) times daily with a meal. Injects 10 units in the morning and 8 units in the evening    . metoprolol tartrate (LOPRESSOR) 25 MG tablet take 1/2 tablet by mouth twice a day 30 tablet 0  . nitroGLYCERIN (NITROSTAT) 0.4 MG SL tablet Place 1 tablet (0.4 mg  total) under the tongue every 5 (five) minutes as needed for chest pain. 25 tablet 3  . ranitidine (ZANTAC) 150 MG capsule Take 150 mg by mouth daily.    . clopidogrel (PLAVIX) 75 MG tablet Take 1 tablet (75 mg total) by mouth daily. 90 tablet 1  . lisinopril (PRINIVIL,ZESTRIL) 10 MG tablet Take 1 tablet (10 mg total) by mouth daily. 90 tablet 1   No current facility-administered medications for this visit.    BP 160/84 mmHg  Pulse 83  Ht 4\' 11"  (1.499 m)  Wt 150 lb 6.4 oz (68.221 kg)  BMI 30.36 kg/m2 General: NAD Neck: No JVD, no thyromegaly or thyroid nodule.  Lungs: Clear to auscultation bilaterally with normal respiratory effort. CV: Nondisplaced PMI.  Heart regular S1/S2, no S3/S4, no murmur.  No peripheral edema.  No carotid bruit.  Normal pedal pulses.  Abdomen: Soft, nontender, no hepatosplenomegaly, no distention.  Skin: Intact without lesions or rashes.  Neurologic: Alert and oriented x 3.  Psych: Normal affect. Extremities: No clubbing or cyanosis.   Assessment/Plan: 1. CAD: s/p inferior STEMI with Xience DES to mLCx.  No ischemic symptoms.  EF preserved.  - Continue ASA 81 mg daily.   - DAPT score = 3.  Will have her stop Brilinta (>1 year)  and start Plavix 75 mg daily for long-term use.   - Continue statin, metoprolol.  2. Hyperlipidemia: Check lipids.  3. Diabetes: Needs close followup due to poor control of blood glucose. I will refer to endocrinology.  4. HTN: BP is running high. I will increase lisinopril to 10 mg daily with BMET in 10 days.   Followup in 6 months.  Loralie Champagne 06/29/2014

## 2014-07-10 ENCOUNTER — Other Ambulatory Visit: Payer: Medicaid Other

## 2014-08-02 ENCOUNTER — Other Ambulatory Visit: Payer: Self-pay

## 2014-08-02 DIAGNOSIS — I2510 Atherosclerotic heart disease of native coronary artery without angina pectoris: Secondary | ICD-10-CM

## 2014-08-02 MED ORDER — ATORVASTATIN CALCIUM 80 MG PO TABS: ORAL_TABLET | ORAL | Status: DC

## 2014-08-03 ENCOUNTER — Other Ambulatory Visit (INDEPENDENT_AMBULATORY_CARE_PROVIDER_SITE_OTHER): Payer: Medicaid Other | Admitting: *Deleted

## 2014-08-03 DIAGNOSIS — I2510 Atherosclerotic heart disease of native coronary artery without angina pectoris: Secondary | ICD-10-CM

## 2014-08-03 DIAGNOSIS — I10 Essential (primary) hypertension: Secondary | ICD-10-CM

## 2014-08-03 DIAGNOSIS — E785 Hyperlipidemia, unspecified: Secondary | ICD-10-CM

## 2014-08-03 LAB — CBC WITH DIFFERENTIAL/PLATELET
BASOS PCT: 0.5 % (ref 0.0–3.0)
Basophils Absolute: 0 10*3/uL (ref 0.0–0.1)
EOS ABS: 0.1 10*3/uL (ref 0.0–0.7)
EOS PCT: 2.3 % (ref 0.0–5.0)
HCT: 37.3 % (ref 36.0–46.0)
Hemoglobin: 12.7 g/dL (ref 12.0–15.0)
LYMPHS ABS: 1.5 10*3/uL (ref 0.7–4.0)
LYMPHS PCT: 24 % (ref 12.0–46.0)
MCHC: 34.1 g/dL (ref 30.0–36.0)
MCV: 84.5 fl (ref 78.0–100.0)
MONO ABS: 0.5 10*3/uL (ref 0.1–1.0)
Monocytes Relative: 7.8 % (ref 3.0–12.0)
NEUTROS ABS: 4 10*3/uL (ref 1.4–7.7)
Neutrophils Relative %: 65.4 % (ref 43.0–77.0)
Platelets: 254 10*3/uL (ref 150.0–400.0)
RBC: 4.42 Mil/uL (ref 3.87–5.11)
RDW: 13 % (ref 11.5–15.5)
WBC: 6.1 10*3/uL (ref 4.0–10.5)

## 2014-08-03 LAB — BASIC METABOLIC PANEL
BUN: 12 mg/dL (ref 6–23)
CO2: 30 mEq/L (ref 19–32)
Calcium: 9.9 mg/dL (ref 8.4–10.5)
Chloride: 103 mEq/L (ref 96–112)
Creatinine, Ser: 0.86 mg/dL (ref 0.40–1.20)
GFR: 71.48 mL/min (ref >60.00)
Glucose, Bld: 139 mg/dL — ABNORMAL HIGH (ref 70–99)
Potassium: 4.1 mEq/L (ref 3.5–5.1)
Sodium: 138 mEq/L (ref 135–145)

## 2014-08-03 LAB — LIPID PANEL
CHOLESTEROL: 191 mg/dL (ref 0–200)
HDL: 38.2 mg/dL — ABNORMAL LOW (ref >39.00)
LDL CALC: 125 mg/dL — AB (ref 0–99)
NonHDL: 152.8
Total CHOL/HDL Ratio: 5
Triglycerides: 140 mg/dL (ref 0.0–149.0)
VLDL: 28 mg/dL (ref 0.0–40.0)

## 2014-08-04 ENCOUNTER — Other Ambulatory Visit: Payer: Self-pay | Admitting: *Deleted

## 2014-08-04 DIAGNOSIS — E785 Hyperlipidemia, unspecified: Secondary | ICD-10-CM

## 2014-08-04 DIAGNOSIS — I2510 Atherosclerotic heart disease of native coronary artery without angina pectoris: Secondary | ICD-10-CM

## 2014-08-04 MED ORDER — ROSUVASTATIN CALCIUM 40 MG PO TABS: 40.0000 mg | ORAL_TABLET | Freq: Every day | ORAL | Status: DC

## 2014-08-21 ENCOUNTER — Telehealth: Payer: Self-pay | Admitting: Cardiology

## 2014-08-21 ENCOUNTER — Telehealth: Payer: Self-pay | Admitting: Emergency Medicine

## 2014-08-21 NOTE — Telephone Encounter (Signed)
New Message  Pt calling, asking about Crestor refill. Please call back and discuss.

## 2014-08-21 NOTE — Telephone Encounter (Signed)
Patient had a Rx request from [facility] requesting Atorvastatin. Per records the medication was discontinued. I called patients son to confirm. He stated that she was NO longer taking this medication. I told him to call back if anything changed.

## 2014-08-22 ENCOUNTER — Encounter: Payer: Medicaid Other | Attending: Endocrinology | Admitting: Nutrition

## 2014-08-22 ENCOUNTER — Other Ambulatory Visit: Payer: Self-pay | Admitting: *Deleted

## 2014-08-22 ENCOUNTER — Encounter: Payer: Self-pay | Admitting: Endocrinology

## 2014-08-22 ENCOUNTER — Ambulatory Visit (INDEPENDENT_AMBULATORY_CARE_PROVIDER_SITE_OTHER): Payer: Medicaid Other | Admitting: Endocrinology

## 2014-08-22 VITALS — BP 129/74 | HR 74 | Temp 98.0°F | Resp 14 | Ht 59.0 in | Wt 148.6 lb

## 2014-08-22 DIAGNOSIS — Z713 Dietary counseling and surveillance: Secondary | ICD-10-CM | POA: Diagnosis not present

## 2014-08-22 DIAGNOSIS — I10 Essential (primary) hypertension: Secondary | ICD-10-CM

## 2014-08-22 DIAGNOSIS — E7800 Pure hypercholesterolemia, unspecified: Secondary | ICD-10-CM

## 2014-08-22 DIAGNOSIS — E119 Type 2 diabetes mellitus without complications: Secondary | ICD-10-CM | POA: Diagnosis present

## 2014-08-22 DIAGNOSIS — IMO0002 Reserved for concepts with insufficient information to code with codable children: Secondary | ICD-10-CM

## 2014-08-22 DIAGNOSIS — Z794 Long term (current) use of insulin: Secondary | ICD-10-CM | POA: Insufficient documentation

## 2014-08-22 DIAGNOSIS — E1165 Type 2 diabetes mellitus with hyperglycemia: Secondary | ICD-10-CM

## 2014-08-22 MED ORDER — GLUCOSE BLOOD VI STRP: ORAL_STRIP | Status: DC

## 2014-08-22 MED ORDER — METFORMIN HCL ER 500 MG PO TB24: ORAL_TABLET | ORAL | Status: DC

## 2014-08-22 MED ORDER — LIRAGLUTIDE 18 MG/3ML ~~LOC~~ SOPN: PEN_INJECTOR | SUBCUTANEOUS | Status: DC

## 2014-08-22 MED ORDER — INSULIN PEN NEEDLE 32G X 4 MM MISC: Status: DC

## 2014-08-22 NOTE — Patient Instructions (Addendum)
Please check blood sugars at least half the time about 2 hours after any meal and 3-4 times per week on waking up.  Please bring blood sugar monitor to each visit.  Recommended blood sugar levels about 2 hours after meal is 140-180 and on waking up 90-130  Start adding PROTEIN to each meal.  This would be in the form of any low-fat cheese, paneer, soy nuggets, lentils, low-fat yogurt, almonds.  Start VICTOZA injection as shown once daily at the same time of the day.  Dial the dose to 0.6 mg on the pen for the first week.   You may inject in the stomach, thigh or arm. You may experience nausea in the first few days which usually goes away.  You will feel fullness of the stomach with starting the medication and should try to keep the portions at meals small.  After 1 week increase the dose to 1.2mg  daily if no nausea present.   If any questions or concerns are present call the office or the [facility] helpline at [phone]. Visit [email or web address].info/ for more useful information  Start reducing evening insulin by 2 units this evening and every 3 days reduce the evening dose by 2 units If the blood sugar in the afternoon is below 100 may reduce the morning insulin by 4 units  Start taking Metformin 500 mg, 1 tablet with your main meal for 5 days. Occasionally this may initially cause loose stools or nausea. If  tolerating well after 5 days add a second Metformin tablet (500 mg) at the same time and continue the dose until the next visit  Stop glipizide

## 2014-08-22 NOTE — Progress Notes (Signed)
Patient ID: [name] [name], female   DOB: Nov 17, 1955, 59 y.o.   MRN: [id number]           Reason for Appointment: Consultation for Type 2 Diabetes  Referring physician:  History of Present Illness:          Diagnosis: Type 2 diabetes mellitus, date of diagnosis: ?  2014   Past history:  The patient gives an inconsistent history. Her son thinks that she may have had diabetes diagnosed when she was in [place] about 2 years ago and was given unknown medications. When she moved to [facility] in 2014 she will not on any medications She was however found to have significant hyperglycemia with glucose 364 and A1c of 11.7 when she was admitted for her coronary artery disease in 04/2013. At that time she was started on premixed insulin twice a day Not clear at what time she was given glipizide also but does not think she has ever taken metformin Her level of control has not been adequate with persistently high A1c readings  Recent history:  She has been seen by her PCP a couple of times. Her insulin has been increased only slightly but blood sugars have continue to be high with gradually increasing A1c since 11/2013 She did not bring her monitor for download and does not keep a written record She appears to have significant postprandial hyperglycemia after lunch and dinner Not clear how often she is monitoring her blood sugar She thinks her fasting blood sugars are usually near normal without overnight hypoglycemia       Oral hypoglycemic drugs the patient is taking are: Glipizide 10 mg twice a day      Side effects from medications have been: None INSULIN regimen is described as:   always 70/30, 10 units at breakfast and 8 at supper  Self-care: The diet that the patient has been following is: None, has vegetarian diet Meals: 3 meals per day. Breakfast is usually a starchy snack with milk or cereal.  Usually having vegetables and roti with lunch and dinner and infrequently having yogurt or  lentils.  Usually not eating fried food except for some snacks            Exercise:  she tries to walk almost daily either indoors or outdoors up to one hour         Dietician visit, none previously Compliance with the medical regimen: Fair Hypoglycemia: None    Glucose monitoring:  done 3 times a day         Glucometer: [employer]       Blood Glucose readings by recall  PREMEAL Breakfast Lunch Dinner Bedtime  Overall   Glucose range: 97-120       Median:        POST-MEAL PC Breakfast PC Lunch PC Dinner  Glucose range:  200 200  Median:      Weight history: She has gradually gained weight over the last few years, previously vague about 60 kg  Wt Readings from Last 3 Encounters:  08/22/14 148 lb 9.6 oz (67.405 kg)  06/28/14 150 lb 6.4 oz (68.221 kg)  05/18/14 153 lb 6.4 oz (69.582 kg)    Glycemic control:   Lab Results  Component Value Date   HGBA1C 8.2 05/18/2014   HGBA1C 7.8 02/16/2014   HGBA1C 7.6 11/16/2013   Lab Results  Component Value Date   LDLCALC 125* 08/03/2014   CREATININE 0.86 08/03/2014  Medication List       This list is accurate as of: 08/22/14  9:40 AM.  Always use your most recent med list.               aspirin EC 81 MG tablet  Take 1 tablet (81 mg total) by mouth daily.     atorvastatin 80 MG tablet  Commonly known as:  LIPITOR     B-D ULTRAFINE III SHORT PEN 31G X 8 MM Misc  Generic drug:  Insulin Pen Needle     clopidogrel 75 MG tablet  Commonly known as:  PLAVIX  Take 1 tablet (75 mg total) by mouth daily.     glipiZIDE 10 MG tablet  Commonly known as:  GLUCOTROL  Take 1 tablet (10 mg total) by mouth 2 (two) times daily before a meal.     insulin aspart protamine - aspart (70-30) 100 UNIT/ML FlexPen  Commonly known as:  NOVOLOG 70/30 MIX  Inject 12 units in the am and 10 units in the pm     insulin NPH-regular Human (70-30) 100 UNIT/ML injection  Commonly known as:  NOVOLIN 70/30  Inject 8-10 Units into the skin  2 (two) times daily with a meal. Injects 10 units in the morning and 8 units in the evening     lisinopril 10 MG tablet  Commonly known as:  PRINIVIL,ZESTRIL  Take 1 tablet (10 mg total) by mouth daily.     metoprolol tartrate 25 MG tablet  Commonly known as:  LOPRESSOR  take 1/2 tablet by mouth twice a day     nitroGLYCERIN 0.4 MG SL tablet  Commonly known as:  NITROSTAT  Place 1 tablet (0.4 mg total) under the tongue every 5 (five) minutes as needed for chest pain.     ranitidine 150 MG capsule  Commonly known as:  ZANTAC  Take 150 mg by mouth daily.     rosuvastatin 40 MG tablet  Commonly known as:  CRESTOR  Take 1 tablet (40 mg total) by mouth daily.        Allergies: No Known Allergies  Past Medical History  Diagnosis Date  . CAD (coronary artery disease)     a. 04/2013 Inf STEMI/PCI: LM nl, LAD min irregs, D1/2/3 nl, LCX 39m (2.5x18 Xience DES), OM1/2/3 nl, RCA nl, PDA nl, RPAV nl, RPL nl, EF 55%;  b. 04/2013 Echo: EF 55-60%, no rwma, mild MR, mildly dil LA.  Marland Kitchen Hypertension   . Hyperlipidemia   . Diabetes mellitus type 2, uncontrolled     a. 04/2013 HbA1c = 11.7.    Past Surgical History  Procedure Laterality Date  . Cesarean section    . Left heart cath  05-15-13  . Left heart catheterization with coronary angiogram N/A 05/15/2013    Procedure: LEFT HEART CATHETERIZATION WITH CORONARY ANGIOGRAM;  Surgeon: Wellington Hampshire, MD;  Location: [facility];  Service: Cardiovascular;  Laterality: N/A;  . Percutaneous stent intervention  05/15/2013    Procedure: PERCUTANEOUS STENT INTERVENTION;  Surgeon: Wellington Hampshire, MD;  Location: [facility];  Service: Cardiovascular;;    Family History  Problem Relation Age of Onset  . Heart disease Mother   . Heart disease Father     Social History:  reports that she has never smoked. She has never used smokeless tobacco. She reports that she does not drink alcohol or use illicit drugs.    Review of Systems        Vision is normal.  Most recent eye exam was        Lipids: Inadequately controlled, managed by cardiologist and supposed to be switching to Crestor because of inadequate control with 80 mg Lipitor; but not clear if Crestor is approved       Lab Results  Component Value Date   CHOL 191 08/03/2014   HDL 38.20* 08/03/2014   LDLCALC 125* 08/03/2014   TRIG 140.0 08/03/2014   CHOLHDL 5 08/03/2014                  Skin: No rash or infections     Thyroid:  No  unusual fatigue.  No history of thyroid disease     The blood pressure has been controlled with lisinopril      No swelling of feet.     No shortness of breath or chest tightness  on exertion.     Bowel habits: Normal.      No joint  pains.          No history of Numbness, tingling or burning in feet       No pain in calf muscles on walking    No history of hoarseness   LABS:  No visits with results within 1 Week(s) from this visit. Latest known visit with results is:  Lab on 08/03/2014  Component Date Value Ref Range Status  . Cholesterol 08/03/2014 191  0 - 200 mg/dL Final   ATP III Classification       Desirable:  < 200 mg/dL               Borderline High:  200 - 239 mg/dL          High:  > = 240 mg/dL  . Triglycerides 08/03/2014 140.0  0.0 - 149.0 mg/dL Final   Normal:  <150 mg/dLBorderline High:  150 - 199 mg/dL  . HDL 08/03/2014 38.20* >39.00 mg/dL Final  . VLDL 08/03/2014 28.0  0.0 - 40.0 mg/dL Final  . LDL Cholesterol 08/03/2014 125* 0 - 99 mg/dL Final  . Total CHOL/HDL Ratio 08/03/2014 5   Final                  Men          Women1/2 Average Risk     3.4          3.3Average Risk          5.0          4.42X Average Risk          9.6          7.13X Average Risk          15.0          11.0                      . NonHDL 08/03/2014 152.80   Final   NOTE:  Non-HDL goal should be 30 mg/dL higher than patient's LDL goal (i.e. LDL goal of < 70 mg/dL, would have non-HDL goal of < 100 mg/dL)  . Sodium 08/03/2014 138   135 - 145 mEq/L Final  . Potassium 08/03/2014 4.1  3.5 - 5.1 mEq/L Final  . Chloride 08/03/2014 103  96 - 112 mEq/L Final  . CO2 08/03/2014 30  19 - 32 mEq/L Final  . Glucose, Bld 08/03/2014 139* 70 - 99 mg/dL Final  . BUN 08/03/2014 12  6 - 23 mg/dL Final  . Creatinine, Ser  08/03/2014 0.86  0.40 - 1.20 mg/dL Final  . Calcium 08/03/2014 9.9  8.4 - 10.5 mg/dL Final  . GFR 08/03/2014 71.48  >60.00 mL/min Final  . WBC 08/03/2014 6.1  4.0 - 10.5 K/uL Final  . RBC 08/03/2014 4.42  3.87 - 5.11 Mil/uL Final  . Hemoglobin 08/03/2014 12.7  12.0 - 15.0 g/dL Final  . HCT 08/03/2014 37.3  36.0 - 46.0 % Final  . MCV 08/03/2014 84.5  78.0 - 100.0 fl Final  . MCHC 08/03/2014 34.1  30.0 - 36.0 g/dL Final  . RDW 08/03/2014 13.0  11.5 - 15.5 % Final  . Platelets 08/03/2014 254.0  150.0 - 400.0 K/uL Final  . Neutrophils Relative % 08/03/2014 65.4  43.0 - 77.0 % Final  . Lymphocytes Relative 08/03/2014 24.0  12.0 - 46.0 % Final  . Monocytes Relative 08/03/2014 7.8  3.0 - 12.0 % Final  . Eosinophils Relative 08/03/2014 2.3  0.0 - 5.0 % Final  . Basophils Relative 08/03/2014 0.5  0.0 - 3.0 % Final  . Neutro Abs 08/03/2014 4.0  1.4 - 7.7 K/uL Final  . Lymphs Abs 08/03/2014 1.5  0.7 - 4.0 K/uL Final  . Monocytes Absolute 08/03/2014 0.5  0.1 - 1.0 K/uL Final  . Eosinophils Absolute 08/03/2014 0.1  0.0 - 0.7 K/uL Final  . Basophils Absolute 08/03/2014 0.0  0.0 - 0.1 K/uL Final    Physical Examination:  BP 129/74 mmHg  Pulse 74  Temp(Src) 98 F (36.7 C)  Resp 14  Ht 4\' 11"  (1.499 m)  Wt 148 lb 9.6 oz (67.405 kg)  BMI 30.00 kg/m2  SpO2 98%  GENERAL:         Patient has mild generalized obesity.   HEENT:         Eye exam shows normal external appearance. Fundus exam shows no retinopathy. Oral exam shows normal mucosa .  NECK:         General:  Neck exam shows no lymphadenopathy. Carotids are normal to palpation and no bruit heard.   Thyroid is not enlarged and no nodules felt.   LUNGS:         Chest  is symmetrical. Lungs are clear to auscultation.Marland Kitchen   HEART:         Heart sounds:  S1 and S2 are normal. No murmurs or clicks heard., no S3 or S4.   ABDOMEN:   There is no distention present. Liver and spleen are not palpable. No other mass or tenderness present.  EXTREMITIES:     There is no edema. No skin lesions present.Marland Kitchen  NEUROLOGICAL:   Vibration sense ismildly  reduced in toes. Ankle jerks are 1+ on the right and 2+ on the left, biceps reflexes are slightly brisk Diabetic foot exam shows normal monofilament sensation in the toes and plantar surfaces, no skin lesions or ulcers on the feet and normal pedal pulses MUSCULOSKELETAL:       There is no enlargement or deformity of the joints. Spine is normal to inspection.Marland Kitchen   SKIN:       No rash or lesions of concern.        ASSESSMENT:  Diabetes type 2, uncontrolled with obesity  Genetically she is likely to be having insulin resistance and is currently only on very low-dose insulin and glipizide. Since she has not had diabetes for a long duration most likely she does not have insulin deficiency Current regimen of low-dose premixed insulin is not controlling her postprandial hyperglycemia Also has persistently high  A1c levels, recently 8.2  Her diet is fairly unbalanced with very low protein content and high glycemic index carbohydrates This was discussed with the patient She is a good candidate for a GLP-1 drug which would work at least as well as insulin especially with postprandial hyperglycemia Also she will be able to lose weight much better compared to insulin; she is continuing to gain weight She may be able to wean off insulin also especially with starting an insulin sensitizer like metformin  Discussed with the patient the nature of GLP-1 drugs, the actions on various organ systems, how they benefit blood glucose control, as well as the benefit of weight loss and  increase satiety . Explained possible side effects especially nausea and  vomiting initially; discussed safety information in package insert.  Described the injection technique and dosage titration of Victoza  starting with 0.6 mg once a day at the same time for the first week and then increasing to 1.2 mg if no symptoms of nausea.  Educational brochure on Victoza given   Complications: None evident, needs to have urine microalbumin checked  HYPERLIPIDEMIA: Inadequately controlled, managed by cardiologist and supposed to be switching to Crestor but not clear if this is approved  PLAN:   Stop glipizide  Start Victoza with the usual protocol as above.  She was also instructed in detail by the nurse educator today  Start tapering insulin starting with evening insulin and will review dosages on the next visit.  Instructions given in detail  Start adding protein to every meal, given examples of dairy products, lentils and soy products she can utilize for adding protein  Bring glucose monitor on each visit for evaluation of home readings, continue monitoring at least once a day after meals by rotation  Start metformin ER for insulin resistance and she will titrate this up to 1000 mg until the next visit if tolerated; may consider higher doses also  Check urine microalbumin on the next visit  Her son will check with the cardiologist for adding Crestor covered for hypercholesterolemia  Encouraged patient to schedule eye exam, discussed importance of preventive eye exams with diabetes   Patient Instructions  Please check blood sugars at least half the time about 2 hours after any meal and 3-4 times per week on waking up.  Please bring blood sugar monitor to each visit.  Recommended blood sugar levels about 2 hours after meal is 140-180 and on waking up 90-130  Start adding PROTEIN to each meal.  This would be in the form of any low-fat cheese, paneer, soy nuggets, lentils, low-fat yogurt, almonds.  Start VICTOZA injection as shown once daily at the same time of  the day.  Dial the dose to 0.6 mg on the pen for the first week.   You may inject in the stomach, thigh or arm. You may experience nausea in the first few days which usually goes away.  You will feel fullness of the stomach with starting the medication and should try to keep the portions at meals small.  After 1 week increase the dose to 1.2mg  daily if no nausea present.   If any questions or concerns are present call the office or the [facility] helpline at [phone]. Visit [email or web address].info/ for more useful information  Start reducing evening insulin by 2 units this evening and every 3 days reduce the evening dose by 2 units If the blood sugar in the afternoon is below 100 may reduce the morning insulin by 4 units  Start taking Metformin 500 mg, 1 tablet with your main meal for 5 days. Occasionally this may initially cause loose stools or nausea. If  tolerating well after 5 days add a second Metformin tablet (500 mg) at the same time and continue the dose until the next visit  Stop glipizide     Counseling time over 50% of today's 60 minute visit   Mykell Rawl 08/22/2014, 9:40 AM   Note: This office note was prepared with Estate agent. Any transcriptional errors that result from this process are unintentional.

## 2014-08-22 NOTE — Progress Notes (Signed)
Discussed with son and patient how the victoza will work to lower blood sugars.  They were shown the pen, and how to dial up the .6mg  dose.  Written instructions were given for 0.6 mg to be take for 7 days, and if no nausea, to increase the dose to 1.2, where they will stay at this dose.  Son reverbalized this dosage adjustment corrrectly, and had no questions.    We discussed the fact that this will help to lower blood sugars after eating, and that they may not need the second injection of insulin.  We discussed the need to reduce the dose by 2u tonight, and every 4 days.  We also reviewed the need to reduce the AM dose of insulin once the daytime readings start dropping below 100.  He was told to reduce the AM dose by 4u when this happens, and he was given written instructions for this.  He was reminded to stop the glipizide as well, and reported good understanding of this.    They were given a Nano meter, and shown how to use this.  Since they are medicaid, they were told that their insurance will pay for these test strips.    They had no final questions.

## 2014-08-22 NOTE — Patient Instructions (Signed)
Take Victoza once a day at 0.6mg .  Increase the dose of Victoza to 1.2 after 7 days, if no nausea.  Decrease the PM dose of insulin by 2u tonight, and again every 3 days. Decrease the AM dose by 4u if blood sugars drop low during the day--below 100 Stop the glipizide Call if questions

## 2014-09-15 ENCOUNTER — Telehealth: Payer: Self-pay

## 2014-09-15 ENCOUNTER — Encounter: Payer: Self-pay | Admitting: Endocrinology

## 2014-09-15 ENCOUNTER — Ambulatory Visit (INDEPENDENT_AMBULATORY_CARE_PROVIDER_SITE_OTHER): Payer: Medicaid Other | Admitting: Endocrinology

## 2014-09-15 VITALS — BP 126/78 | HR 82 | Temp 97.9°F | Resp 14 | Ht 59.0 in | Wt 149.2 lb

## 2014-09-15 DIAGNOSIS — E1165 Type 2 diabetes mellitus with hyperglycemia: Secondary | ICD-10-CM

## 2014-09-15 DIAGNOSIS — IMO0002 Reserved for concepts with insufficient information to code with codable children: Secondary | ICD-10-CM

## 2014-09-15 MED ORDER — ATORVASTATIN CALCIUM 80 MG PO TABS: 80.0000 mg | ORAL_TABLET | Freq: Every day | ORAL | Status: DC

## 2014-09-15 NOTE — Progress Notes (Signed)
Patient ID: [name] [name], female   DOB: 1956/01/05, 59 y.o.   MRN: [id number]           Reason for Appointment: Follow-up for Type 2 Diabetes   History of Present Illness:          Diagnosis: Type 2 diabetes mellitus, date of diagnosis: ?  2014   Past history:  The patient gives an inconsistent history. Her son thinks that she may have had diabetes diagnosed when she was in [place] about 2 years ago and was given unknown medications. When she moved to [facility] in 2014 she will not on any medications She was however found to have significant hyperglycemia with glucose 364 and A1c of 11.7 when she was admitted for her coronary artery disease in 04/2013. At that time she was started on premixed insulin twice a day Not clear at what time she was given glipizide also but does not think she has ever taken metformin Her level of control has not been adequate with persistently high A1c readings  Recent history:  Her blood sugars prior to the visit here were continuing to be high with gradually increasing A1c since 11/2013 She was told to start Victoza because of marked postprandial hyperglycemia with her taking only twice a day premixed insulin but it was not approved until today. She was also started on metformin ER which she has titrated up to 1000 mg a day, and initially did have some loose stools with this She was told to stop glipizide but not clear if she is taking this or not He was also advised on trying to get balanced meals with more protein at every meal but she has not made many changes except adding some yogurt at lunch. She did not bring her monitor for download again and didn't bring a record today She appears to have significant postprandial hyperglycemia after lunch and somewhat after dinner also and this is not improved Has not been able to lose weight       Oral hypoglycemic drugs the patient is taking are: Glipizide 10 mg twice a day, Metformin ER 1000 mg daily        Side effects from medications have been: None INSULIN regimen is described as: 70/30, 10 units at breakfast and 8 at supper  Self-care: The diet that the patient has been following is: None, has vegetarian diet Meals: 3 meals per day. Breakfast is usually a cookies/crackers with milk or cereal.   Usually having vegetables and roti with lunch and dinner and sometimes having yogurt or lentils.   Usually not eating fried food except for some snacks            Exercise:  she tries to walk almost daily either indoors or outdoors up to one hour         Dietician visit, none previously Compliance with the medical regimen: Fair Hypoglycemia: None    Glucose monitoring:  done 3 times a day         Glucometer: [employer] brand       Blood Glucose readings by   PREMEAL Breakfast Lunch Dinner Bedtime  Overall   Glucose range: 123-140       Median:        POST-MEAL PC Breakfast PC Lunch PC Dinner  Glucose range:  216-324 168-223  Median:      Weight history: She has gradually gained weight over the last few years, previously vague about 60 kg  Wt Readings from Last 3  Encounters:  09/15/14 149 lb 3.2 oz (67.677 kg)  08/22/14 148 lb 9.6 oz (67.405 kg)  06/28/14 150 lb 6.4 oz (68.221 kg)    Glycemic control:   Lab Results  Component Value Date   HGBA1C 8.2 05/18/2014   HGBA1C 7.8 02/16/2014   HGBA1C 7.6 11/16/2013   Lab Results  Component Value Date   LDLCALC 125* 08/03/2014   CREATININE 0.86 08/03/2014         Medication List       This list is accurate as of: 09/15/14  5:22 PM.  Always use your most recent med list.               aspirin EC 81 MG tablet  Take 1 tablet (81 mg total) by mouth daily.     atorvastatin 80 MG tablet  Commonly known as:  LIPITOR  Take 1 tablet (80 mg total) by mouth daily.     clopidogrel 75 MG tablet  Commonly known as:  PLAVIX  Take 1 tablet (75 mg total) by mouth daily.     glipiZIDE 10 MG tablet  Commonly known as:  GLUCOTROL  Take  1 tablet (10 mg total) by mouth 2 (two) times daily before a meal.     glucose blood test strip  Commonly known as:  ACCU-CHEK SMARTVIEW  Use as instructed to check blood sugar 3 times per day dx code E11.9     insulin NPH-regular Human (70-30) 100 UNIT/ML injection  Commonly known as:  NOVOLIN 70/30  Inject 8-10 Units into the skin 2 (two) times daily with a meal. Injects 10 units in the morning and 8 units in the evening     Insulin Pen Needle 32G X 4 MM Misc  Commonly known as:  BD PEN NEEDLE NANO U/F  Use 1 needle per day with Victoza     Liraglutide 18 MG/3ML Sopn  Commonly known as:  VICTOZA  Inject 1.2 mg daily     lisinopril 10 MG tablet  Commonly known as:  PRINIVIL,ZESTRIL  Take 1 tablet (10 mg total) by mouth daily.     metFORMIN 500 MG 24 hr tablet  Commonly known as:  GLUCOPHAGE-XR  Take 3 tablets daily     metoprolol tartrate 25 MG tablet  Commonly known as:  LOPRESSOR  take 1/2 tablet by mouth twice a day     nitroGLYCERIN 0.4 MG SL tablet  Commonly known as:  NITROSTAT  Place 1 tablet (0.4 mg total) under the tongue every 5 (five) minutes as needed for chest pain.     NOVOLOG MIX 70/30 FLEXPEN (70-30) 100 UNIT/ML FlexPen  Generic drug:  insulin aspart protamine - aspart  Injects 10 units in the morning and 8 units at night     rosuvastatin 40 MG tablet  Commonly known as:  CRESTOR  Take 1 tablet (40 mg total) by mouth daily.        Allergies: No Known Allergies  Past Medical History  Diagnosis Date  . CAD (coronary artery disease)     a. 04/2013 Inf STEMI/PCI: LM nl, LAD min irregs, D1/2/3 nl, LCX 66m (2.5x18 Xience DES), OM1/2/3 nl, RCA nl, PDA nl, RPAV nl, RPL nl, EF 55%;  b. 04/2013 Echo: EF 55-60%, no rwma, mild MR, mildly dil LA.  Marland Kitchen Hypertension   . Hyperlipidemia   . Diabetes mellitus type 2, uncontrolled     a. 04/2013 HbA1c = 11.7.    Past Surgical History  Procedure Laterality Date  .  Cesarean section    . Left heart cath  05-15-13    . Left heart catheterization with coronary angiogram N/A 05/15/2013    Procedure: LEFT HEART CATHETERIZATION WITH CORONARY ANGIOGRAM;  Surgeon: Wellington Hampshire, MD;  Location: [facility];  Service: Cardiovascular;  Laterality: N/A;  . Percutaneous stent intervention  05/15/2013    Procedure: PERCUTANEOUS STENT INTERVENTION;  Surgeon: Wellington Hampshire, MD;  Location: [facility];  Service: Cardiovascular;;    Family History  Problem Relation Age of Onset  . Heart disease Mother   . Diabetes Mother   . Hypertension Mother   . Heart disease Father   . Diabetes Father   . Hypertension Father   . Diabetes Brother     Social History:  reports that she has never smoked. She has never used smokeless tobacco. She reports that she does not drink alcohol or use illicit drugs.    Review of Systems     She is overdue for eye exams       Lipids: Inadequately controlled, managed by cardiologist and supposed to be switching to Crestor because of inadequate control with 80 mg Lipitor        Lab Results  Component Value Date   CHOL 191 08/03/2014   HDL 38.20* 08/03/2014   LDLCALC 125* 08/03/2014   TRIG 140.0 08/03/2014   CHOLHDL 5 08/03/2014                   The blood pressure has been controlled with lisinopril     LABS:  No visits with results within 1 Week(s) from this visit. Latest known visit with results is:  Lab on 08/03/2014  Component Date Value Ref Range Status  . Cholesterol 08/03/2014 191  0 - 200 mg/dL Final   ATP III Classification       Desirable:  < 200 mg/dL               Borderline High:  200 - 239 mg/dL          High:  > = 240 mg/dL  . Triglycerides 08/03/2014 140.0  0.0 - 149.0 mg/dL Final   Normal:  <150 mg/dLBorderline High:  150 - 199 mg/dL  . HDL 08/03/2014 38.20* >39.00 mg/dL Final  . VLDL 08/03/2014 28.0  0.0 - 40.0 mg/dL Final  . LDL Cholesterol 08/03/2014 125* 0 - 99 mg/dL Final  . Total CHOL/HDL Ratio 08/03/2014 5   Final                  Men           Women1/2 Average Risk     3.4          3.3Average Risk          5.0          4.42X Average Risk          9.6          7.13X Average Risk          15.0          11.0                      . NonHDL 08/03/2014 152.80   Final   NOTE:  Non-HDL goal should be 30 mg/dL higher than patient's LDL goal (i.e. LDL goal of < 70 mg/dL, would have non-HDL goal of < 100 mg/dL)  . Sodium 08/03/2014 138  135 - 145  mEq/L Final  . Potassium 08/03/2014 4.1  3.5 - 5.1 mEq/L Final  . Chloride 08/03/2014 103  96 - 112 mEq/L Final  . CO2 08/03/2014 30  19 - 32 mEq/L Final  . Glucose, Bld 08/03/2014 139* 70 - 99 mg/dL Final  . BUN 08/03/2014 12  6 - 23 mg/dL Final  . Creatinine, Ser 08/03/2014 0.86  0.40 - 1.20 mg/dL Final  . Calcium 08/03/2014 9.9  8.4 - 10.5 mg/dL Final  . GFR 08/03/2014 71.48  >60.00 mL/min Final  . WBC 08/03/2014 6.1  4.0 - 10.5 K/uL Final  . RBC 08/03/2014 4.42  3.87 - 5.11 Mil/uL Final  . Hemoglobin 08/03/2014 12.7  12.0 - 15.0 g/dL Final  . HCT 08/03/2014 37.3  36.0 - 46.0 % Final  . MCV 08/03/2014 84.5  78.0 - 100.0 fl Final  . MCHC 08/03/2014 34.1  30.0 - 36.0 g/dL Final  . RDW 08/03/2014 13.0  11.5 - 15.5 % Final  . Platelets 08/03/2014 254.0  150.0 - 400.0 K/uL Final  . Neutrophils Relative % 08/03/2014 65.4  43.0 - 77.0 % Final  . Lymphocytes Relative 08/03/2014 24.0  12.0 - 46.0 % Final  . Monocytes Relative 08/03/2014 7.8  3.0 - 12.0 % Final  . Eosinophils Relative 08/03/2014 2.3  0.0 - 5.0 % Final  . Basophils Relative 08/03/2014 0.5  0.0 - 3.0 % Final  . Neutro Abs 08/03/2014 4.0  1.4 - 7.7 K/uL Final  . Lymphs Abs 08/03/2014 1.5  0.7 - 4.0 K/uL Final  . Monocytes Absolute 08/03/2014 0.5  0.1 - 1.0 K/uL Final  . Eosinophils Absolute 08/03/2014 0.1  0.0 - 0.7 K/uL Final  . Basophils Absolute 08/03/2014 0.0  0.0 - 0.1 K/uL Final    Physical Examination:  BP 126/78 mmHg  Pulse 82  Temp(Src) 97.9 F (36.6 C)  Resp 14  Ht 4\' 11"  (1.499 m)  Wt 149 lb 3.2 oz (67.677 kg)   BMI 30.12 kg/m2  SpO2 97%   ASSESSMENT:  Diabetes type 2, uncontrolled with obesity  Genetically she is likely to be having insulin resistance and is currently only on very low-dose insulin and glipizide. Since she has not had diabetes for a long duration most likely she does not have significant insulin deficiency She was supposed to start on Victoza but has not been able to get this through her insurance but has started metformin Not clear if her sugars are better with starting metformin Current regimen of low-dose premixed insulin is not controlling her postprandial hyperglycemia at lunchtime and also at suppertime She has persistently high A1c levels, recently 8.2  HYPERLIPIDEMIA: Inadequately controlled, managed by cardiologist or PCP and needs to follow-up with them  PLAN:   Stop glipizide  Start Victoza with the usual protocol since it is available now  Change insulin to 3 times a day with smaller dose at breakfast and lunch especially since she is eating a small breakfast  Consider basal bolus insulin if premixed insulin is not effective in controlling postprandial readings with Victoza  Start tapering insulin starting with evening insulin if blood sugars started coming down with Victoza  Consistent protein at every meal, given examples of dairy products, lentils and soy products she can utilize for adding protein  Bring glucose monitor on each visit for evaluation of home readings, continue monitoring at least once a day after meals by rotation     Patient Instructions  Please check blood sugars at least half the time about 2 hours  after any meal and 3 times per week on waking up. Please bring blood sugar monitor to each visit. Recommended blood sugar levels about 2 hours after meal is 140-180 and on waking up 90-130  Insulin 8 units in am and 6 units before lunch and 6 before dinner  Stop Glipizide  Take 1 Metformin in am and 2 at dinner  Have a protein at  each meal with low fat diary food, low fat peanut butter or soy, dals  Counseling time over 50% of today's 25 minute visit   Cairo Agostinelli 09/15/2014, 5:22 PM   Note: This office note was prepared with Estate agent. Any transcriptional errors that result from this process are unintentional.

## 2014-09-15 NOTE — Patient Instructions (Addendum)
Please check blood sugars at least half the time about 2 hours after any meal and 3 times per week on waking up. Please bring blood sugar monitor to each visit. Recommended blood sugar levels about 2 hours after meal is 140-180 and on waking up 90-130  Insulin 8 units in am and 6 units before lunch and 6 before dinner  Stop Glipizide  Take 1 Metformin in am and 2 at dinner  Have a protein at each meal with low fat diary food, low fat peanut butter or soy, dals

## 2014-09-15 NOTE — Telephone Encounter (Signed)
Patient daughter calling requesting a refill on her moms cholesterol medication Prescription sent to the [facility] on file

## 2014-09-18 ENCOUNTER — Other Ambulatory Visit: Payer: Self-pay | Admitting: *Deleted

## 2014-09-18 MED ORDER — LIRAGLUTIDE 18 MG/3ML ~~LOC~~ SOPN: PEN_INJECTOR | SUBCUTANEOUS | Status: DC

## 2014-09-26 ENCOUNTER — Telehealth: Payer: Self-pay

## 2014-09-26 NOTE — Telephone Encounter (Signed)
Letter from [employer] PA approved for Victoza. Dated 09/12/14.   Expires [id number] PA# [id number]  Phone number with any questions: [phone]

## 2014-09-28 ENCOUNTER — Other Ambulatory Visit (INDEPENDENT_AMBULATORY_CARE_PROVIDER_SITE_OTHER): Payer: Medicaid Other | Admitting: *Deleted

## 2014-09-28 DIAGNOSIS — E785 Hyperlipidemia, unspecified: Secondary | ICD-10-CM | POA: Diagnosis not present

## 2014-09-28 DIAGNOSIS — I2510 Atherosclerotic heart disease of native coronary artery without angina pectoris: Secondary | ICD-10-CM

## 2014-09-28 LAB — HEPATIC FUNCTION PANEL
ALK PHOS: 57 U/L (ref 39–117)
ALT: 16 U/L (ref 0–35)
AST: 16 U/L (ref 0–37)
Albumin: 4.3 g/dL (ref 3.5–5.2)
BILIRUBIN TOTAL: 0.6 mg/dL (ref 0.2–1.2)
Bilirubin, Direct: 0.1 mg/dL (ref 0.0–0.3)
Total Protein: 7.4 g/dL (ref 6.0–8.3)

## 2014-09-28 LAB — LIPID PANEL
CHOLESTEROL: 84 mg/dL (ref 0–200)
HDL: 30 mg/dL — ABNORMAL LOW (ref >39.00)
LDL Cholesterol: 37 mg/dL (ref 0–99)
NONHDL: 54
Total CHOL/HDL Ratio: 3
Triglycerides: 85 mg/dL (ref 0.0–149.0)
VLDL: 17 mg/dL (ref 0.0–40.0)

## 2014-10-09 ENCOUNTER — Other Ambulatory Visit (INDEPENDENT_AMBULATORY_CARE_PROVIDER_SITE_OTHER): Payer: Medicaid Other

## 2014-10-09 DIAGNOSIS — E1165 Type 2 diabetes mellitus with hyperglycemia: Secondary | ICD-10-CM

## 2014-10-09 DIAGNOSIS — IMO0002 Reserved for concepts with insufficient information to code with codable children: Secondary | ICD-10-CM

## 2014-10-09 LAB — COMPREHENSIVE METABOLIC PANEL
ALK PHOS: 50 U/L (ref 39–117)
ALT: 16 U/L (ref 0–35)
AST: 17 U/L (ref 0–37)
Albumin: 4.2 g/dL (ref 3.5–5.2)
BUN: 8 mg/dL (ref 6–23)
CO2: 27 mEq/L (ref 19–32)
Calcium: 10.1 mg/dL (ref 8.4–10.5)
Chloride: 102 mEq/L (ref 96–112)
Creatinine, Ser: 0.72 mg/dL (ref 0.40–1.20)
GFR: 87.7 mL/min (ref >60.00)
GLUCOSE: 97 mg/dL (ref 70–99)
Potassium: 4.4 mEq/L (ref 3.5–5.1)
Sodium: 136 mEq/L (ref 135–145)
Total Bilirubin: 0.5 mg/dL (ref 0.2–1.2)
Total Protein: 7.4 g/dL (ref 6.0–8.3)

## 2014-10-09 LAB — POCT URINALYSIS DIPSTICK
Bilirubin, UA: NEGATIVE
Glucose, UA: NEGATIVE
Leukocytes, UA: NEGATIVE
Nitrite, UA: NEGATIVE
PH UA: 5
Spec Grav, UA: 1.02
UROBILINOGEN UA: NEGATIVE

## 2014-10-09 LAB — MICROALBUMIN / CREATININE URINE RATIO
Creatinine,U: 245.8 mg/dL
Microalb Creat Ratio: 0.5 mg/g (ref 0.0–30.0)
Microalb, Ur: 1.3 mg/dL (ref 0.0–1.9)

## 2014-10-09 LAB — HEMOGLOBIN A1C: HEMOGLOBIN A1C: 7.3 % — AB (ref 4.6–6.5)

## 2014-10-13 ENCOUNTER — Encounter: Payer: Self-pay | Admitting: Endocrinology

## 2014-10-13 ENCOUNTER — Ambulatory Visit (INDEPENDENT_AMBULATORY_CARE_PROVIDER_SITE_OTHER): Payer: Medicaid Other | Admitting: Endocrinology

## 2014-10-13 VITALS — BP 130/88 | HR 82 | Temp 98.0°F | Resp 14 | Ht 59.0 in | Wt 143.0 lb

## 2014-10-13 DIAGNOSIS — E1165 Type 2 diabetes mellitus with hyperglycemia: Secondary | ICD-10-CM

## 2014-10-13 DIAGNOSIS — E7800 Pure hypercholesterolemia, unspecified: Secondary | ICD-10-CM

## 2014-10-13 DIAGNOSIS — IMO0002 Reserved for concepts with insufficient information to code with codable children: Secondary | ICD-10-CM

## 2014-10-13 NOTE — Patient Instructions (Signed)
Stop insulin at dinner  Reduce Victoza to 0.6mg  daily and take at bedtime, call if still nauseated  Walk daily  Metformin 2 at Breakfast and 1 at dinner  Please check blood sugars at least half the time about 2 hours after any meal and 3 times per week on waking up. Please bring blood sugar monitor to each visit. Recommended blood sugar levels about 2 hours after meal is 140-180 and on waking up 90-130

## 2014-10-13 NOTE — Progress Notes (Signed)
Patient ID: [name] [name], female   DOB: December 11, 1955, 59 y.o.   MRN: [id number]           Reason for Appointment: Follow-up for Type 2 Diabetes   History of Present Illness:          Diagnosis: Type 2 diabetes mellitus, date of diagnosis: ?  2014   Past history:  The patient gives an inconsistent history. Her son thinks that she may have had diabetes diagnosed when she was in [place] about 2 years ago and was given unknown medications. When she moved to [facility] in 2014 she will not on any medications She was however found to have significant hyperglycemia with glucose 364 and A1c of 11.7 when she was admitted for her coronary artery disease in 04/2013. At that time she was started on premixed insulin twice a day Not clear at what time she was given glipizide also but does not think she has ever taken metformin Her level of control has not been adequate with persistently high A1c readings Her blood sugars prior to the visit here were continuing to be high with gradually increasing A1c since 11/2013  Recent history:  She was told to start Victoza and she finally did so in early 4/16 She was told to titrate the dose after one week if having no nausea but even though she was having significant nausea she went up to 1.2 mg after one week She continues to have decreased appetite and some malaise and nausea Has cut back on her portions markedly because of continued fullness and is generally eating only at breakfast Glipizide was stopped on the last visit also  Current blood sugar patterns and problems:  Her POSTPRANDIAL blood sugars are remarkably better, previously were frequently over 200 and occasionally over 300.  Has only occasional readings in the 160-170 range  She has checked her blood sugars several times a day  Lowest blood sugars are in the morning and she felt dizzy when her blood sugar was 76, 2 mornings ago  Blood sugars are also relatively low after supper; last night  was 150 but has been as low as 81  Her son will give her some Gatorade if she is not able to eat  She is still trying to continue walking and has lost some weight finally.  Tolerating metformin but has not increased the dose to 1500 mg as yet, taking 2 tablets at supper time   because of marked postprandial hyperglycemia with her taking only twice a day premixed insulin but it was not approved until today.  She is also taking some insulin at lunchtime now       Oral hypoglycemic drugs the patient is taking are: Glipizide 10 mg twice a day, Metformin ER 1000 mg daily      Side effects from medications have been: None INSULIN regimen is described as: 70/30, 6 units at breakfast 4 and 6 at supper  Self-care: The diet that the patient has been following is: None, has vegetarian diet Meals: 3 meals per day. Breakfast is usually a cookies/crackers with milk or cereal.   Usually having vegetables and roti with lunch and dinner and sometimes having yogurt or lentils.   Usually not eating fried food except for some snacks            Exercise:  she tries to walk almost daily either indoors or outdoors up to one hour         Dietician visit, none previously Compliance  with the medical regimen: Fair Hypoglycemia: None    Glucose monitoring:  done 3 times a day         Glucometer: [employer] brand       Blood Glucose readings by meter download recently:  PRE-MEAL Breakfast Lunch  PCL   PCS  Overall  Glucose range:  76-96   94-177   101-161   81-150    Mean/median:  94   144    124   132    Weight history: She has gradually gained weight over the last few years, previously vague about 60 kg  Wt Readings from Last 3 Encounters:  10/13/14 143 lb (64.864 kg)  09/15/14 149 lb 3.2 oz (67.677 kg)  08/22/14 148 lb 9.6 oz (67.405 kg)    Glycemic control:   Lab Results  Component Value Date   HGBA1C 7.3* 10/09/2014   HGBA1C 8.2 05/18/2014   HGBA1C 7.8 02/16/2014   Lab Results  Component Value  Date   MICROALBUR 1.3 10/09/2014   LDLCALC 37 09/28/2014   CREATININE 0.72 10/09/2014         Medication List       This list is accurate as of: 10/13/14  9:45 AM.  Always use your most recent med list.               aspirin EC 81 MG tablet  Take 1 tablet (81 mg total) by mouth daily.     atorvastatin 80 MG tablet  Commonly known as:  LIPITOR  Take 1 tablet (80 mg total) by mouth daily.     clopidogrel 75 MG tablet  Commonly known as:  PLAVIX  Take 1 tablet (75 mg total) by mouth daily.     glipiZIDE 10 MG tablet  Commonly known as:  GLUCOTROL  Take 1 tablet (10 mg total) by mouth 2 (two) times daily before a meal.     glucose blood test strip  Commonly known as:  ACCU-CHEK SMARTVIEW  Use as instructed to check blood sugar 3 times per day dx code E11.9     insulin NPH-regular Human (70-30) 100 UNIT/ML injection  Commonly known as:  NOVOLIN 70/30  Inject 14 Units into the skin 2 (two) times daily with a meal. 6-4-6     Insulin Pen Needle 32G X 4 MM Misc  Commonly known as:  BD PEN NEEDLE NANO U/F  Use 1 needle per day with Victoza     Liraglutide 18 MG/3ML Sopn  Commonly known as:  VICTOZA  Inject 1.2 mg daily     lisinopril 10 MG tablet  Commonly known as:  PRINIVIL,ZESTRIL  Take 1 tablet (10 mg total) by mouth daily.     metFORMIN 500 MG 24 hr tablet  Commonly known as:  GLUCOPHAGE-XR  Take 3 tablets daily     metoprolol tartrate 25 MG tablet  Commonly known as:  LOPRESSOR  take 1/2 tablet by mouth twice a day     nitroGLYCERIN 0.4 MG SL tablet  Commonly known as:  NITROSTAT  Place 1 tablet (0.4 mg total) under the tongue every 5 (five) minutes as needed for chest pain.     NOVOLOG MIX 70/30 FLEXPEN (70-30) 100 UNIT/ML FlexPen  Generic drug:  insulin aspart protamine - aspart  Injects 10 units in the morning and 8 units at night     rosuvastatin 40 MG tablet  Commonly known as:  CRESTOR  Take 1 tablet (40 mg total) by mouth daily.  Allergies: No Known Allergies  Past Medical History  Diagnosis Date  . CAD (coronary artery disease)     a. 04/2013 Inf STEMI/PCI: LM nl, LAD min irregs, D1/2/3 nl, LCX 50m (2.5x18 Xience DES), OM1/2/3 nl, RCA nl, PDA nl, RPAV nl, RPL nl, EF 55%;  b. 04/2013 Echo: EF 55-60%, no rwma, mild MR, mildly dil LA.  Marland Kitchen Hypertension   . Hyperlipidemia   . Diabetes mellitus type 2, uncontrolled     a. 04/2013 HbA1c = 11.7.    Past Surgical History  Procedure Laterality Date  . Cesarean section    . Left heart cath  05-15-13  . Left heart catheterization with coronary angiogram N/A 05/15/2013    Procedure: LEFT HEART CATHETERIZATION WITH CORONARY ANGIOGRAM;  Surgeon: Wellington Hampshire, MD;  Location: [facility];  Service: Cardiovascular;  Laterality: N/A;  . Percutaneous stent intervention  05/15/2013    Procedure: PERCUTANEOUS STENT INTERVENTION;  Surgeon: Wellington Hampshire, MD;  Location: [facility];  Service: Cardiovascular;;    Family History  Problem Relation Age of Onset  . Heart disease Mother   . Diabetes Mother   . Hypertension Mother   . Heart disease Father   . Diabetes Father   . Hypertension Father   . Diabetes Brother     Social History:  reports that she has never smoked. She has never used smokeless tobacco. She reports that she does not drink alcohol or use illicit drugs.    Review of Systems     She is overdue for eye exams       Lipids: Inadequately controlled, managed by cardiologist and supposed to be switching to Crestor because of inadequate control with 80 mg Lipitor        Lab Results  Component Value Date   CHOL 84 09/28/2014   HDL 30.00* 09/28/2014   LDLCALC 37 09/28/2014   TRIG 85.0 09/28/2014   CHOLHDL 3 09/28/2014                   The blood pressure has been controlled with lisinopril     LABS:  Lab on 10/09/2014  Component Date Value Ref Range Status  . Hgb A1c MFr Bld 10/09/2014 7.3* 4.6 - 6.5 % Final   Glycemic Control  Guidelines for People with Diabetes:Non Diabetic:  <6%Goal of Therapy: <7%Additional Action Suggested:  >8%   . Sodium 10/09/2014 136  135 - 145 mEq/L Final  . Potassium 10/09/2014 4.4  3.5 - 5.1 mEq/L Final  . Chloride 10/09/2014 102  96 - 112 mEq/L Final  . CO2 10/09/2014 27  19 - 32 mEq/L Final  . Glucose, Bld 10/09/2014 97  70 - 99 mg/dL Final  . BUN 10/09/2014 8  6 - 23 mg/dL Final  . Creatinine, Ser 10/09/2014 0.72  0.40 - 1.20 mg/dL Final  . Total Bilirubin 10/09/2014 0.5  0.2 - 1.2 mg/dL Final  . Alkaline Phosphatase 10/09/2014 50  39 - 117 U/L Final  . AST 10/09/2014 17  0 - 37 U/L Final  . ALT 10/09/2014 16  0 - 35 U/L Final  . Total Protein 10/09/2014 7.4  6.0 - 8.3 g/dL Final  . Albumin 10/09/2014 4.2  3.5 - 5.2 g/dL Final  . Calcium 10/09/2014 10.1  8.4 - 10.5 mg/dL Final  . GFR 10/09/2014 87.70  >60.00 mL/min Final  . Microalb, Ur 10/09/2014 1.3  0.0 - 1.9 mg/dL Final  . Creatinine,U 10/09/2014 245.8   Final  . Microalb  Creat Ratio 10/09/2014 0.5  0.0 - 30.0 mg/g Final  . Color, UA 10/09/2014 Yellow   Final  . Clarity, UA 10/09/2014 Cloudy   Final  . Glucose, UA 10/09/2014 Neg   Final  . Bilirubin, UA 10/09/2014 Neg   Final  . Ketones, UA 10/09/2014 Trace   Final  . Spec Grav, UA 10/09/2014 1.020   Final  . Blood, UA 10/09/2014 Trace   Final  . pH, UA 10/09/2014 5.0   Final  . Protein, UA 10/09/2014 Trace   Final  . Urobilinogen, UA 10/09/2014 negative   Final  . Nitrite, UA 10/09/2014 Neg   Final  . Leukocytes, UA 10/09/2014 Negative   Final    Physical Examination:  BP 130/88 mmHg  Pulse 82  Temp(Src) 98 F (36.7 C)  Resp 14  Ht 4\' 11"  (1.499 m)  Wt 143 lb (64.864 kg)  BMI 28.87 kg/m2  SpO2 97%   ASSESSMENT:  Diabetes type 2, uncontrolled with obesity  She has marked improvement in her blood sugars with adding Victoza However with 1.2 mg she is having marked decrease in appetite and persistent nausea which she did not report Her insulin doses have  been reduced and her blood sugars appear to be the lowest after supper and in the morning Also not having any difficulty taking metformin, currently taking 1000 mg a day A1c has improved to 7.3  PLAN:   Stop insulin at suppertime  Reduce Victoza to 0.6 mg and call if continuing to have any nausea.  Try taking this at bedtime   Change metformin to 2 tablets in the morning and 1 at dinnertime  Consider reducing lunchtime insulin or stopping the dose if blood sugars are still relatively low in the evenings   Patient Instructions  Stop insulin at dinner  Reduce Victoza to 0.6mg  daily and take at bedtime, call if still nauseated  Walk daily  Metformin 2 at Breakfast and 1 at dinner  Please check blood sugars at least half the time about 2 hours after any meal and 3 times per week on waking up. Please bring blood sugar monitor to each visit. Recommended blood sugar levels about 2 hours after meal is 140-180 and on waking up 90-130     Josslin Sanjuan 10/13/2014, 9:45 AM   Note: This office note was prepared with Estate agent. Any transcriptional errors that result from this process are unintentional.

## 2014-10-18 ENCOUNTER — Telehealth: Payer: Self-pay | Admitting: Nutrition

## 2014-10-18 ENCOUNTER — Other Ambulatory Visit: Payer: Self-pay | Admitting: *Deleted

## 2014-10-18 MED ORDER — CANAGLIFLOZIN 100 MG PO TABS: 100.0000 mg | ORAL_TABLET | Freq: Every day | ORAL | Status: DC

## 2014-10-18 NOTE — Telephone Encounter (Signed)
Advised to start the [place] as below, and told to drop the insulin dose down 2u if blood sugars drop below 120.  He reported good understanding of this.

## 2014-10-18 NOTE — Telephone Encounter (Signed)
Linda please see below

## 2014-10-18 NOTE — Telephone Encounter (Signed)
Okay to leave off Victoza. Start Invokana 100 mg daily before breakfast instead.  This will eliminate sugar through the kidneys and if blood sugars are coming down below 120 she can cut back on her insulin 2 units

## 2014-10-18 NOTE — Telephone Encounter (Signed)
Noted, rx sent.

## 2014-10-18 NOTE — Telephone Encounter (Signed)
Son reports that he reduced the dose of Victoza to 0.6, for his mother, and she was taking it before bed, but was up all night X2 vomiting.  He has stopped the Victoza because she was feeling very weak. Blood sugars last 3 days fasting: 136, 120, 116

## 2014-10-18 NOTE — Telephone Encounter (Signed)
She will need a prescription for Invokana 100 mg

## 2014-11-09 ENCOUNTER — Encounter: Payer: Self-pay | Admitting: Physician Assistant

## 2014-11-24 ENCOUNTER — Other Ambulatory Visit: Payer: Self-pay | Admitting: *Deleted

## 2014-11-24 ENCOUNTER — Ambulatory Visit (INDEPENDENT_AMBULATORY_CARE_PROVIDER_SITE_OTHER): Payer: Medicaid Other | Admitting: Endocrinology

## 2014-11-24 ENCOUNTER — Encounter: Payer: Self-pay | Admitting: Endocrinology

## 2014-11-24 VITALS — BP 118/74 | HR 79 | Temp 97.8°F | Resp 14 | Ht 59.0 in | Wt 138.0 lb

## 2014-11-24 DIAGNOSIS — E1165 Type 2 diabetes mellitus with hyperglycemia: Secondary | ICD-10-CM

## 2014-11-24 DIAGNOSIS — E7800 Pure hypercholesterolemia, unspecified: Secondary | ICD-10-CM

## 2014-11-24 DIAGNOSIS — IMO0002 Reserved for concepts with insufficient information to code with codable children: Secondary | ICD-10-CM

## 2014-11-24 MED ORDER — ACARBOSE 25 MG PO TABS: 25.0000 mg | ORAL_TABLET | Freq: Three times a day (TID) | ORAL | Status: DC

## 2014-11-24 MED ORDER — METFORMIN HCL ER 500 MG PO TB24: ORAL_TABLET | ORAL | Status: DC

## 2014-11-24 NOTE — Patient Instructions (Signed)
Acarbose 25mg  at start of each meal  No cereal or cookies; try oatmeal and cottage cheese or other cheese/ [place] yogurt  Check blood sugars on waking up .Marland Kitchen3-4  .Marland Kitchen times a week Also check blood sugars about 2 hours after a meal and do this after different meals by rotation  Recommended blood sugar levels on waking up is 90-130 and about 2 hours after meal is 140-180 Please bring blood sugar monitor to each visit.  Stop insulin

## 2014-11-24 NOTE — Progress Notes (Signed)
Patient ID: [name] [name], female   DOB: January 17, 1956, 59 y.o.   MRN: [id number]           Reason for Appointment: Follow-up for Type 2 Diabetes   History of Present Illness:          Diagnosis: Type 2 diabetes mellitus, date of diagnosis: ?  2014   Past history:  The patient gives an inconsistent history. Her son thinks that she may have had diabetes diagnosed when she was in [place] about 2 years ago and was given unknown medications. When she moved to [facility] in 2014 she will not on any medications She was however found to have significant hyperglycemia with glucose 364 and A1c of 11.7 when she was admitted for her coronary artery disease in 04/2013. At that time she was started on premixed insulin twice a day Not clear at what time she was given glipizide also but does not think she has ever taken metformin Her level of control has not been adequate with persistently high A1c readings Her blood sugars prior to the visit here were continuing to be high with gradually increasing A1c since 11/2013  Recent history:  She was told to start Victoza and she finally did so in early 4/16; at that time glipizide was stopped and insulin continued; at that time her A1c was relatively better at 7.3. She did not tolerate this well and was having decreased appetite, malaise and nausea Even with reducing the dose back to 0.6 mg she had similar side effects and she stopped this on her own after her last visit in 4/16 Since she called about stopping the Victoza she was started empirically on Invokana 100 mg daily in the morning She has taken this without any side effects She appears to have lost further weight with starting this Although she has been continued on low-dose insulin she has stopped her SUPPERTIME dose on her own for no apparent reason  Current blood sugar patterns and problems:  Her blood sugars are usually near-normal in the mornings  Blood sugars after breakfast are mildly  increased, somewhat variably; she is eating cereal and unsweetened cookies at breakfast without any protein again  Blood sugars are variably high after lunch with occasional readings over 200  She has again variable readings after supper but only occasionally high despite this being her main meal       Oral hypoglycemic drugs the patient is taking are:   Metformin ER 500 mg 3 tablets daily, Invokana 100 mg daily      Side effects from medications have been: None INSULIN regimen is described as: 70/30, 6 units at breakfast 4 before lunch  Self-care: The diet that the patient has been following is: None, has vegetarian diet Meals: 3 meals per day. Breakfast is usually a cookies/crackers with milk and cold cereal.   Usually having vegetables and roti with lunch and dinner and sometimes having yogurt or lentils.   Usually not eating fried food except for some snacks            Exercise:  she tries to walk almost daily either indoors or outdoors up to one hour         Dietician visit, none previously Compliance with the medical regimen: Fair Hypoglycemia: None    Glucose monitoring:  done 3 times a day         Glucometer: [employer] brand       Blood Glucose readings by meter download recently:  Mean values  apply above for all meters except median for One Touch  PRE-MEAL Fasting Lunch Dinner Bedtime Overall  Glucose range:  101-125      91-258   Mean/median:  125      159    POST-MEAL PC Breakfast PC Lunch PC Dinner  Glucose range:   106-216   117-234   Mean/median:   165   166     Weight history:   Wt Readings from Last 3 Encounters:  11/24/14 138 lb (62.596 kg)  10/13/14 143 lb (64.864 kg)  09/15/14 149 lb 3.2 oz (67.677 kg)    Glycemic control:   Lab Results  Component Value Date   HGBA1C 7.3* 10/09/2014   HGBA1C 8.2 05/18/2014   HGBA1C 7.8 02/16/2014   Lab Results  Component Value Date   MICROALBUR 1.3 10/09/2014   LDLCALC 37 09/28/2014   CREATININE 0.72 10/09/2014           Medication List       This list is accurate as of: 11/24/14 11:59 PM.  Always use your most recent med list.               acarbose 25 MG tablet  Commonly known as:  PRECOSE  Take 1 tablet (25 mg total) by mouth 3 (three) times daily with meals.     aspirin EC 81 MG tablet  Take 1 tablet (81 mg total) by mouth daily.     atorvastatin 80 MG tablet  Commonly known as:  LIPITOR  Take 1 tablet (80 mg total) by mouth daily.     canagliflozin 100 MG Tabs tablet  Commonly known as:  INVOKANA  Take 1 tablet (100 mg total) by mouth daily.     clopidogrel 75 MG tablet  Commonly known as:  PLAVIX  Take 1 tablet (75 mg total) by mouth daily.     glucose blood test strip  Commonly known as:  ACCU-CHEK SMARTVIEW  Use as instructed to check blood sugar 3 times per day dx code E11.9     insulin NPH-regular Human (70-30) 100 UNIT/ML injection  Commonly known as:  NOVOLIN 70/30  Inject 10 Units into the skin 2 (two) times daily with a meal.     Insulin Pen Needle 32G X 4 MM Misc  Commonly known as:  BD PEN NEEDLE NANO U/F  Use 1 needle per day with Victoza     lisinopril 10 MG tablet  Commonly known as:  PRINIVIL,ZESTRIL  Take 1 tablet (10 mg total) by mouth daily.     metFORMIN 500 MG 24 hr tablet  Commonly known as:  GLUCOPHAGE-XR  Take 3 tablets daily     metoprolol tartrate 25 MG tablet  Commonly known as:  LOPRESSOR  take 1/2 tablet by mouth twice a day     nitroGLYCERIN 0.4 MG SL tablet  Commonly known as:  NITROSTAT  Place 1 tablet (0.4 mg total) under the tongue every 5 (five) minutes as needed for chest pain.     NOVOLOG MIX 70/30 FLEXPEN (70-30) 100 UNIT/ML FlexPen  Generic drug:  insulin aspart protamine - aspart  Injects 10 units in the morning and 8 units at night        Allergies: No Known Allergies  Past Medical History  Diagnosis Date  . CAD (coronary artery disease)     a. 04/2013 Inf STEMI/PCI: LM nl, LAD min irregs, D1/2/3 nl, LCX 76m  (2.5x18 Xience DES), OM1/2/3 nl, RCA nl, PDA nl, RPAV nl, RPL  nl, EF 55%;  b. 04/2013 Echo: EF 55-60%, no rwma, mild MR, mildly dil LA.  Marland Kitchen Hypertension   . Hyperlipidemia   . Diabetes mellitus type 2, uncontrolled     a. 04/2013 HbA1c = 11.7.    Past Surgical History  Procedure Laterality Date  . Cesarean section    . Left heart cath  05-15-13  . Left heart catheterization with coronary angiogram N/A 05/15/2013    Procedure: LEFT HEART CATHETERIZATION WITH CORONARY ANGIOGRAM;  Surgeon: Wellington Hampshire, MD;  Location: [place] CATH [facility];  Service: Cardiovascular;  Laterality: N/A;  . Percutaneous stent intervention  05/15/2013    Procedure: PERCUTANEOUS STENT INTERVENTION;  Surgeon: Wellington Hampshire, MD;  Location: [place] CATH [facility];  Service: Cardiovascular;;    Family History  Problem Relation Age of Onset  . Heart disease Mother   . Diabetes Mother   . Hypertension Mother   . Heart disease Father   . Diabetes Father   . Hypertension Father   . Diabetes Brother     Social History:  reports that she has never smoked. She has never used smokeless tobacco. She reports that she does not drink alcohol or use illicit drugs.    Review of Systems     She is overdue for eye exams       Lipids: Inadequately controlled, managed by cardiologist and supposed to be switching to Crestor because of inadequate control with 80 mg Lipitor        Lab Results  Component Value Date   CHOL 84 09/28/2014   HDL 30.00* 09/28/2014   LDLCALC 37 09/28/2014   TRIG 85.0 09/28/2014   CHOLHDL 3 09/28/2014                   The blood pressure has been controlled with lisinopril     LABS:  No visits with results within 1 Week(s) from this visit. Latest known visit with results is:  Lab on 10/09/2014  Component Date Value Ref Range Status  . Hgb A1c MFr Bld 10/09/2014 7.3* 4.6 - 6.5 % Final   Glycemic Control Guidelines for People with Diabetes:Non Diabetic:  <6%Goal of Therapy: <7%Additional Action  Suggested:  >8%   . Sodium 10/09/2014 136  135 - 145 mEq/L Final  . Potassium 10/09/2014 4.4  3.5 - 5.1 mEq/L Final  . Chloride 10/09/2014 102  96 - 112 mEq/L Final  . CO2 10/09/2014 27  19 - 32 mEq/L Final  . Glucose, Bld 10/09/2014 97  70 - 99 mg/dL Final  . BUN 10/09/2014 8  6 - 23 mg/dL Final  . Creatinine, Ser 10/09/2014 0.72  0.40 - 1.20 mg/dL Final  . Total Bilirubin 10/09/2014 0.5  0.2 - 1.2 mg/dL Final  . Alkaline Phosphatase 10/09/2014 50  39 - 117 U/L Final  . AST 10/09/2014 17  0 - 37 U/L Final  . ALT 10/09/2014 16  0 - 35 U/L Final  . Total Protein 10/09/2014 7.4  6.0 - 8.3 g/dL Final  . Albumin 10/09/2014 4.2  3.5 - 5.2 g/dL Final  . Calcium 10/09/2014 10.1  8.4 - 10.5 mg/dL Final  . GFR 10/09/2014 87.70  >60.00 mL/min Final  . Microalb, Ur 10/09/2014 1.3  0.0 - 1.9 mg/dL Final  . Creatinine,U 10/09/2014 245.8   Final  . Microalb Creat Ratio 10/09/2014 0.5  0.0 - 30.0 mg/g Final  . Color, UA 10/09/2014 Yellow   Final  . Clarity, UA 10/09/2014 Cloudy   Final  .  Glucose, UA 10/09/2014 Neg   Final  . Bilirubin, UA 10/09/2014 Neg   Final  . Ketones, UA 10/09/2014 Trace   Final  . Spec Grav, UA 10/09/2014 1.020   Final  . Blood, UA 10/09/2014 Trace   Final  . pH, UA 10/09/2014 5.0   Final  . Protein, UA 10/09/2014 Trace   Final  . Urobilinogen, UA 10/09/2014 negative   Final  . Nitrite, UA 10/09/2014 Neg   Final  . Leukocytes, UA 10/09/2014 Negative   Final    Physical Examination:  BP 118/74 mmHg  Pulse 79  Temp(Src) 97.8 F (36.6 C)  Resp 14  Ht 4\' 11"  (1.499 m)  Wt 138 lb (62.596 kg)  BMI 27.86 kg/m2  SpO2 96%   ASSESSMENT:  Diabetes type 2, uncontrolled with obesity  She had improved blood sugars with Victoza but was not able to get off insulin completely Also she had significant decrease in appetite and some nausea that was persisting and she says that she did not tolerate 0.6 mg also Currently taking Invokana which was started in early 5/16 with some  improvement in her blood sugars as well as weight Also tolerating metformin 1500 mg  Since she has only high readings after meals and no overnight hyperglycemia even without suppertime insulin she may not need insulin at all considering that she is taking small doses and is now losing weight  PLAN:   Stop insulin at breakfast and lunch also  Start  Precose 25 mg before each meal, discussed how this works and possible side effects and timing of medication  Consider increasing dose of Invokana  Call if blood sugars are significantly high   Patient Instructions  Acarbose 25mg  at start of each meal  No cereal or cookies; try oatmeal and cottage cheese or other cheese/ [place] yogurt  Check blood sugars on waking up .Marland Kitchen3-4  .Marland Kitchen times a week Also check blood sugars about 2 hours after a meal and do this after different meals by rotation  Recommended blood sugar levels on waking up is 90-130 and about 2 hours after meal is 140-180 Please bring blood sugar monitor to each visit.  Stop insulin     Kenia Teagarden 11/26/2014, 9:32 PM   Note: This office note was prepared with Dragon voice recognition system technology. Any transcriptional errors that result from this process are unintentional.

## 2014-11-27 ENCOUNTER — Other Ambulatory Visit: Payer: Self-pay | Admitting: *Deleted

## 2014-11-27 MED ORDER — GLUCOSE BLOOD VI STRP: ORAL_STRIP | Status: DC

## 2014-12-20 ENCOUNTER — Other Ambulatory Visit: Payer: Self-pay | Admitting: Cardiology

## 2014-12-26 ENCOUNTER — Encounter: Payer: Self-pay | Admitting: Endocrinology

## 2015-01-02 ENCOUNTER — Other Ambulatory Visit (INDEPENDENT_AMBULATORY_CARE_PROVIDER_SITE_OTHER): Payer: Medicaid Other

## 2015-01-02 ENCOUNTER — Other Ambulatory Visit: Payer: Medicaid Other

## 2015-01-02 DIAGNOSIS — E1165 Type 2 diabetes mellitus with hyperglycemia: Secondary | ICD-10-CM

## 2015-01-02 DIAGNOSIS — IMO0002 Reserved for concepts with insufficient information to code with codable children: Secondary | ICD-10-CM

## 2015-01-02 LAB — COMPREHENSIVE METABOLIC PANEL
ALT: 12 U/L (ref 0–35)
AST: 13 U/L (ref 0–37)
Albumin: 4.3 g/dL (ref 3.5–5.2)
Alkaline Phosphatase: 47 U/L (ref 39–117)
BUN: 10 mg/dL (ref 6–23)
CALCIUM: 10 mg/dL (ref 8.4–10.5)
CO2: 27 meq/L (ref 19–32)
Chloride: 104 mEq/L (ref 96–112)
Creatinine, Ser: 0.72 mg/dL (ref 0.40–1.20)
GFR: 87.63 mL/min (ref >60.00)
GLUCOSE: 158 mg/dL — AB (ref 70–99)
Potassium: 4.5 mEq/L (ref 3.5–5.1)
Sodium: 138 mEq/L (ref 135–145)
Total Bilirubin: 0.4 mg/dL (ref 0.2–1.2)
Total Protein: 7.2 g/dL (ref 6.0–8.3)

## 2015-01-02 LAB — HEMOGLOBIN A1C: Hgb A1c MFr Bld: 7.1 % — ABNORMAL HIGH (ref 4.6–6.5)

## 2015-01-05 ENCOUNTER — Ambulatory Visit: Payer: Medicaid Other | Admitting: Endocrinology

## 2015-01-08 ENCOUNTER — Encounter: Payer: Self-pay | Admitting: Endocrinology

## 2015-01-08 ENCOUNTER — Ambulatory Visit (INDEPENDENT_AMBULATORY_CARE_PROVIDER_SITE_OTHER): Payer: Medicaid Other | Admitting: Endocrinology

## 2015-01-08 VITALS — BP 136/82 | HR 88 | Temp 98.2°F | Resp 14 | Ht 59.0 in | Wt 138.2 lb

## 2015-01-08 DIAGNOSIS — I10 Essential (primary) hypertension: Secondary | ICD-10-CM

## 2015-01-08 DIAGNOSIS — IMO0002 Reserved for concepts with insufficient information to code with codable children: Secondary | ICD-10-CM

## 2015-01-08 DIAGNOSIS — E1165 Type 2 diabetes mellitus with hyperglycemia: Secondary | ICD-10-CM

## 2015-01-08 NOTE — Patient Instructions (Signed)
Check blood sugars on waking up .. 2-3 .. times a week  Also check blood sugars about 2 hours after a meal and do this after different meals by rotation  Recommended blood sugar levels on waking up is 90-130 and about 2 hours after meal is 140-180 Please bring blood sugar monitor to each visit.  

## 2015-01-08 NOTE — Progress Notes (Signed)
Cardiology Office Note   Date:  01/08/2015   ID:  [name] [name], DOB 1956/04/02, MRN [id number]  PCP:  [name] Marek, MD  Cardiologist:  Dr. Loralie Champagne     Chief Complaint  Patient presents with  . Coronary Artery Disease     History of Present Illness: [name] [name] is a 59 y.o. female with a hx of  diabetes, HTN, and CAD presents for cardiology followup. She is [place] and speaks little [place]. There is an interpreter today. Patient was admitted in 11/14 with inferior STEMI. She had DES to mLCx. EF was preserved. She has not had any chest pain. She does not get exertional dyspnea. She walks in her house or in stores without trouble other than knee pain from osteoarthritis. BP is high today. Blood glucose control continues to be a challenge.   Labs (12/14); K 4.2, creatinine 0.68, BNP 352, HCT 35.6 Labs (2/15): LDL 41, HDL 36, K 4.3, creatinine 0.8  PMH: 1. Type II diabetes 2. HTN 3. CAD: Inferior STEMI 11/14. LHC (11/14) with 99% mLCx stenosis treated with Xience DES. EF 55%. Echo (11/14) with EF 55-60%, mild LVH, mild MR.  4. Hyperlipidemia 5. OA left knee 6. HTN   Studies/Reports Reviewed Today:  Echo 05/15/13 - Left ventricle: The cavity size was normal. Wall thickness was increased in a pattern of mild LVH. Systolic function was normal. The estimated ejection fraction was in the range of 55% to 60%. Wall motion was normal; there were no regional wall motion abnormalities. - Mitral valve: Mild regurgitation. - Left atrium: The atrium was mildly dilated. - Atrial septum: Redundant but no obvious PFO/ASD No defect or patent foramen ovale was identified.  LHC/PCI 05/15/13 Coronary dominance: Right   Left Main: Normal   Left Anterior Descending (LAD): Normal in size with minor irregularities.  1st diagonal (D1): Normal in size with no significant disease.  2nd diagonal (D2): Medium in size with no significant  disease.  3rd diagonal (D3): Small in size with no significant disease.  Circumflex (LCx): Normal in size with minor irregularities proximally. There is a 99% hazy stenosis in the midsegment which is likely the culprit for inferior ST elevation MI. The stenosis is at the origin of OM 2.  1st obtuse marginal: Small in size with minor irregularities.  2nd obtuse marginal: Normal in size with minor irregularities.  3rd obtuse marginal: Normal in size with no significant disease.  Right Coronary Artery: normal in size with no significant disease.  Posterior descending artery: Normal  Posterior AV segment: Small in size with no significant disease.  Posterolateral branchs: Small and normal posterolateral branches.  Left ventriculography: Left ventricular systolic function is normal , LVEF is estimated at 55 %, there is no significant mitral regurgitation   PCI Note: Following the diagnostic procedure, the decision was made to proceed with PCI. Weight-based bivalirudin was given for anticoagulation. Once a therapeutic ACT was achieved, a 6 [place] XB 3.0 guide catheter was inserted. A intuition coronary guidewire was used to cross the lesion. The lesion was predilated with a 2.5 x 12 balloon. The lesion was then stented with a 2.5 x 18 mm Xience expedition drug-eluting stent. There was a significant vessel mismatch between the proximal and the distal segment of the stent. The stent was postdilated with a 3.0 x 12 noncompliant balloon. Following PCI, there was 0% residual stenosis and TIMI-3 flow. Final angiography confirmed an excellent result. The patient tolerated the procedure well. There were no immediate procedural complications.  A TR band was used for radial hemostasis. The patient was transferred to the post catheterization recovery area for further monitoring.  PCI Data: Vessel - left circumflex artery/Segment - mid Percent Stenosis (pre) 99% TIMI-flow 3 Stent : 2.5 x  18 mm Xience expedition drug-eluting stent postdilated with a 3.0 noncompliant balloon in the mid and proximal segment Percent Stenosis (post) 0% TIMI-flow (post) 3   Final Conclusions:  1. Severe one-vessel coronary artery disease with 99% hazy mid left circumflex stenosis which is the culprit for inferior ST elevation MI. 2. Normal LV systolic function and mildly elevated left ventricular end-diastolic pressure. 3. Successful angioplasty and drug-eluting stent placement to the left circumflex artery.  Recommendations:  Continue dual antiplatelet therapy for at least one year. Aggressive treatment of risk factors is recommended.  Past Medical History  Diagnosis Date  . CAD (coronary artery disease)     a. 04/2013 Inf STEMI/PCI: LM nl, LAD min irregs, D1/2/3 nl, LCX 66m (2.5x18 Xience DES), OM1/2/3 nl, RCA nl, PDA nl, RPAV nl, RPL nl, EF 55%;  b. 04/2013 Echo: EF 55-60%, no rwma, mild MR, mildly dil LA.  Marland Kitchen Hypertension   . Hyperlipidemia   . Diabetes mellitus type 2, uncontrolled     a. 04/2013 HbA1c = 11.7.    Past Surgical History  Procedure Laterality Date  . Cesarean section    . Left heart cath  05-15-13  . Left heart catheterization with coronary angiogram N/A 05/15/2013    Procedure: LEFT HEART CATHETERIZATION WITH CORONARY ANGIOGRAM;  Surgeon: Wellington Hampshire, MD;  Location: [place] [facility];  Service: Cardiovascular;  Laterality: N/A;  . Percutaneous stent intervention  05/15/2013    Procedure: PERCUTANEOUS STENT INTERVENTION;  Surgeon: Wellington Hampshire, MD;  Location: [place] CATH [facility];  Service: Cardiovascular;;     Current Outpatient Prescriptions  Medication Sig Dispense Refill  . acarbose (PRECOSE) 25 MG tablet Take 1 tablet (25 mg total) by mouth 3 (three) times daily with meals. 90 tablet 3  . aspirin EC 81 MG tablet Take 1 tablet (81 mg total) by mouth daily.    Marland Kitchen atorvastatin (LIPITOR) 80 MG tablet Take 1 tablet (80 mg total) by mouth daily. 30 tablet 2  .  canagliflozin (INVOKANA) 100 MG TABS tablet Take 1 tablet (100 mg total) by mouth daily. 30 tablet 3  . clopidogrel (PLAVIX) 75 MG tablet take 1 tablet by mouth once daily 90 tablet 0  . glucose blood (ACCU-CHEK SMARTVIEW) test strip Use as instructed to check blood sugar 3 times per day dx code E11.9 100 each 3  . insulin NPH-regular (NOVOLIN 70/30) (70-30) 100 UNIT/ML injection Inject 10 Units into the skin 2 (two) times daily with a meal.     . Insulin Pen Needle (BD PEN NEEDLE NANO U/F) 32G X 4 MM MISC Use 1 needle per day with Victoza 30 each 2  . lisinopril (PRINIVIL,ZESTRIL) 10 MG tablet take 1 tablet by mouth once daily 90 tablet 0  . metFORMIN (GLUCOPHAGE-XR) 500 MG 24 hr tablet Take 3 tablets daily 90 tablet 2  . metoprolol tartrate (LOPRESSOR) 25 MG tablet take 1/2 tablet by mouth twice a day 30 tablet 0  . nitroGLYCERIN (NITROSTAT) 0.4 MG SL tablet Place 1 tablet (0.4 mg total) under the tongue every 5 (five) minutes as needed for chest pain. 25 tablet 3  . NOVOLOG MIX 70/30 FLEXPEN (70-30) 100 UNIT/ML FlexPen Injects 10 units in the morning and 8 units at night  1  No current facility-administered medications for this visit.    Allergies:   Review of patient's allergies indicates no known allergies.    Social History:  The patient  reports that she has never smoked. She has never used smokeless tobacco. She reports that she does not drink alcohol or use illicit drugs.   Family History:  The patient's family history includes Diabetes in her brother, father, and mother; Heart disease in her father and mother; Hypertension in her father and mother.    ROS:   Please see the history of present illness.   ROS    PHYSICAL EXAM: VS:  There were no vitals taken for this visit.    Wt Readings from Last 3 Encounters:  01/08/15 138 lb 3.2 oz (62.687 kg)  11/24/14 138 lb (62.596 kg)  10/13/14 143 lb (64.864 kg)     GEN: Well nourished, well developed, in no acute distress HEENT:  normal Neck: no JVD, no carotid bruits, no masses Cardiac:  Normal S1/S2, RRR; no murmur ,  no rubs or gallops, no edema  Respiratory:  clear to auscultation bilaterally, no wheezing, rhonchi or rales. GI: soft, nontender, nondistended, + BS MS: no deformity or atrophy Skin: warm and dry  Neuro:  CNs II-XII intact, Strength and sensation are intact Psych: Normal affect   EKG:  EKG is ordered today.  It demonstrates:      Recent Labs: 08/03/2014: Hemoglobin 12.7; Platelets 254.0 01/02/2015: ALT 12; BUN 10; Creatinine, Ser 0.72; Potassium 4.5; Sodium 138    Lipid Panel    Component Value Date/Time   CHOL 84 09/28/2014 0910   TRIG 85.0 09/28/2014 0910   HDL 30.00* 09/28/2014 0910   CHOLHDL 3 09/28/2014 0910   VLDL 17.0 09/28/2014 0910   LDLCALC 37 09/28/2014 0910      ASSESSMENT AND PLAN:  No diagnosis found.     Medication Changes: Current medicines are reviewed at length with the patient today.  Concerns regarding medicines are as outlined above.  The following changes have been made:   Discontinued Medications   No medications on file   Modified Medications   No medications on file   New Prescriptions   No medications on file     Labs/ tests ordered today include:   No orders of the defined types were placed in this encounter.     Disposition:   FU with    Signed, Richardson Dopp, PA-C, M[facility] 01/08/2015 9:30 PM    [address], [place], [place]  [address] Phone: [phone]; Fax: [phone]    This encounter was created in error - please disregard.

## 2015-01-08 NOTE — Progress Notes (Signed)
Patient ID: [name] [name], female   DOB: 1956/01/01, 59 y.o.   MRN: [id number]           Reason for Appointment: Follow-up for Type 2 Diabetes   History of Present Illness:          Diagnosis: Type 2 diabetes mellitus, date of diagnosis: ?  2014   Past history:  The patient gives an inconsistent history. Her son thinks that she may have had diabetes diagnosed when she was in [place] about 2 years ago and was given unknown medications. When she moved to [facility] in 2014 she will not on any medications She was however found to have significant hyperglycemia with glucose 364 and A1c of 11.7 when she was admitted for her coronary artery disease in 04/2013. At that time she was started on premixed insulin twice a day Not clear at what time she was given glipizide also but does not think she has ever taken metformin Her level of control has not been adequate with persistently high A1c readings Her blood sugars prior to the visit here were continuing to be high with gradually increasing A1c since 11/2013  Recent history:  She did not tolerate Victoza well and was having decreased appetite, malaise and nausea and this was stopped subsequently To help with the control she was then started on Invokana 100 mg daily in the morning However her blood sugars were higher after meals and Acarbose 25 mg was added before meals on her visit in 6/16 She has taken this without any side effects and is taking it before each meal and as directed  Also her insulin doses at breakfast and lunch were stopped, was taking 6 and 4 units only  Current blood sugar patterns and problems:  Her blood sugars are usually near-normal in the mornings and fairly consistent  She has somewhat higher blood sugars midday and afternoon but not consistently high  Some of her sugars after supper are over 200 but less consistently recently  She thinks some of the high sugars are related to eating more carbohydrate or foods  like ice cream        Oral hypoglycemic drugs the patient is taking are:   Metformin ER 500 mg 3 tablets daily, Invokana 100 mg daily, Precose 25 mg before meals 3 times a day      Side effects from medications have been: None  Self-care: The diet that the patient has been following is: None, has vegetarian diet Usually having vegetables and roti with lunch and dinner and sometimes having yogurt or lentils.  Adding almonds for protein especially in the morning  Usually not eating fried food except for some snacks            Exercise:  she tries to walk almost daily either indoors or outdoors up to one hour         Dietician visit, none previously Compliance with the medical regimen: Fair Hypoglycemia: None    Glucose monitoring:  done 2-3 times a day         Glucometer: Accu-Chek  Blood Glucose readings by meter download recently:  Mean values apply above for all meters except median for One Touch  PRE-MEAL Fasting Lunch Dinner Bedtime Overall  Glucose range:  114-150   135-188  128-189   127-244    Mean/median:  125     169   149    Weight history:   Wt Readings from Last 3 Encounters:  01/08/15 138 lb  3.2 oz (62.687 kg)  11/24/14 138 lb (62.596 kg)  10/13/14 143 lb (64.864 kg)    Glycemic control:   Lab Results  Component Value Date   HGBA1C 7.1* 01/02/2015   HGBA1C 7.3* 10/09/2014   HGBA1C 8.2 05/18/2014   Lab Results  Component Value Date   MICROALBUR 1.3 10/09/2014   LDLCALC 37 09/28/2014   CREATININE 0.72 01/02/2015         Medication List       This list is accurate as of: 01/08/15  1:34 PM.  Always use your most recent med list.               acarbose 25 MG tablet  Commonly known as:  PRECOSE  Take 1 tablet (25 mg total) by mouth 3 (three) times daily with meals.     aspirin EC 81 MG tablet  Take 1 tablet (81 mg total) by mouth daily.     atorvastatin 80 MG tablet  Commonly known as:  LIPITOR  Take 1 tablet (80 mg total) by mouth daily.       canagliflozin 100 MG Tabs tablet  Commonly known as:  INVOKANA  Take 1 tablet (100 mg total) by mouth daily.     clopidogrel 75 MG tablet  Commonly known as:  PLAVIX  take 1 tablet by mouth once daily     glucose blood test strip  Commonly known as:  ACCU-CHEK SMARTVIEW  Use as instructed to check blood sugar 3 times per day dx code E11.9     insulin NPH-regular Human (70-30) 100 UNIT/ML injection  Commonly known as:  NOVOLIN 70/30  Inject 10 Units into the skin 2 (two) times daily with a meal.     Insulin Pen Needle 32G X 4 MM Misc  Commonly known as:  BD PEN NEEDLE NANO U/F  Use 1 needle per day with Victoza     lisinopril 10 MG tablet  Commonly known as:  PRINIVIL,ZESTRIL  take 1 tablet by mouth once daily     metFORMIN 500 MG 24 hr tablet  Commonly known as:  GLUCOPHAGE-XR  Take 3 tablets daily     metoprolol tartrate 25 MG tablet  Commonly known as:  LOPRESSOR  take 1/2 tablet by mouth twice a day     nitroGLYCERIN 0.4 MG SL tablet  Commonly known as:  NITROSTAT  Place 1 tablet (0.4 mg total) under the tongue every 5 (five) minutes as needed for chest pain.     NOVOLOG MIX 70/30 FLEXPEN (70-30) 100 UNIT/ML FlexPen  Generic drug:  insulin aspart protamine - aspart  Injects 10 units in the morning and 8 units at night        Allergies: No Known Allergies  Past Medical History  Diagnosis Date  . CAD (coronary artery disease)     a. 04/2013 Inf STEMI/PCI: LM nl, LAD min irregs, D1/2/3 nl, LCX 22m (2.5x18 Xience DES), OM1/2/3 nl, RCA nl, PDA nl, RPAV nl, RPL nl, EF 55%;  b. 04/2013 Echo: EF 55-60%, no rwma, mild MR, mildly dil LA.  Marland Kitchen Hypertension   . Hyperlipidemia   . Diabetes mellitus type 2, uncontrolled     a. 04/2013 HbA1c = 11.7.    Past Surgical History  Procedure Laterality Date  . Cesarean section    . Left heart cath  05-15-13  . Left heart catheterization with coronary angiogram N/A 05/15/2013    Procedure: LEFT HEART CATHETERIZATION WITH  CORONARY ANGIOGRAM;  Surgeon: Wellington Hampshire,  MD;  Location: [place] [facility];  Service: Cardiovascular;  Laterality: N/A;  . Percutaneous stent intervention  05/15/2013    Procedure: PERCUTANEOUS STENT INTERVENTION;  Surgeon: Wellington Hampshire, MD;  Location: [place] [facility];  Service: Cardiovascular;;    Family History  Problem Relation Age of Onset  . Heart disease Mother   . Diabetes Mother   . Hypertension Mother   . Heart disease Father   . Diabetes Father   . Hypertension Father   . Diabetes Brother     Social History:  reports that she has never smoked. She has never used smokeless tobacco. She reports that she does not drink alcohol or use illicit drugs.    Review of Systems        Lipids: She has a prescription for Lipitor from PCP.  Lipids are well controlled, has history of CAD       Lab Results  Component Value Date   CHOL 84 09/28/2014   HDL 30.00* 09/28/2014   LDLCALC 37 09/28/2014   TRIG 85.0 09/28/2014   CHOLHDL 3 09/28/2014                   The blood pressure has been controlled with lisinopril with no hyperkalemia or renal dysfunction    LABS:  Lab on 01/02/2015  Component Date Value Ref Range Status  . Hgb A1c MFr Bld 01/02/2015 7.1* 4.6 - 6.5 % Final   Glycemic Control Guidelines for People with Diabetes:Non Diabetic:  <6%Goal of Therapy: <7%Additional Action Suggested:  >8%   . Sodium 01/02/2015 138  135 - 145 mEq/L Final  . Potassium 01/02/2015 4.5  3.5 - 5.1 mEq/L Final  . Chloride 01/02/2015 104  96 - 112 mEq/L Final  . CO2 01/02/2015 27  19 - 32 mEq/L Final  . Glucose, Bld 01/02/2015 158* 70 - 99 mg/dL Final  . BUN 01/02/2015 10  6 - 23 mg/dL Final  . Creatinine, Ser 01/02/2015 0.72  0.40 - 1.20 mg/dL Final  . Total Bilirubin 01/02/2015 0.4  0.2 - 1.2 mg/dL Final  . Alkaline Phosphatase 01/02/2015 47  39 - 117 U/L Final  . AST 01/02/2015 13  0 - 37 U/L Final  . ALT 01/02/2015 12  0 - 35 U/L Final  . Total Protein 01/02/2015 7.2  6.0 - 8.3 g/dL  Final  . Albumin 01/02/2015 4.3  3.5 - 5.2 g/dL Final  . Calcium 01/02/2015 10.0  8.4 - 10.5 mg/dL Final  . GFR 01/02/2015 87.63  >60.00 mL/min Final    Physical Examination:  BP 136/82 mmHg  Pulse 88  Temp(Src) 98.2 F (36.8 C)  Resp 14  Ht 4\' 11"  (1.499 m)  Wt 138 lb 3.2 oz (62.687 kg)  BMI 27.90 kg/m2  SpO2 97%   ASSESSMENT:  Diabetes type 2, uncontrolled with obesity  She has had improved blood sugars with using Invokana and now Precose before meals without any side effects Although her A1c is still slightly over 7% it is improving She is also trying to improve her diet with more protein at meals With checking her blood sugars after meals she has not had consistent hyperglycemia and highest blood sugars are after supper, averaging 169 She has been able to get off insulin with her new regimen  PLAN:   No change in medications as yet  Consistent protein with every meal and moderation of carbohydrate  Consider increasing dose of Invokana if A1c stays high on the next visit  Call if  blood sugars are significantly high   Patient Instructions  Check blood sugars on waking up .Marland Kitchen 2-3 .Marland Kitchen times a week Also check blood sugars about 2 hours after a meal and do this after different meals by rotation  Recommended blood sugar levels on waking up is 90-130 and about 2 hours after meal is 140-180 Please bring blood sugar monitor to each visit.     [name] [name] 01/08/2015, 1:34 PM   Note: This office note was prepared with Dragon voice recognition system technology. Any transcriptional errors that result from this process are unintentional.

## 2015-01-09 ENCOUNTER — Encounter: Payer: Medicaid Other | Admitting: Physician Assistant

## 2015-01-17 NOTE — Progress Notes (Addendum)
Cardiology Office Note   Date:  01/18/2015   ID:  [name] [name], DOB [birth year], MRN [id number]  PCP:  Lorayne Marek, MD  Cardiologist:  Dr. Loralie Champagne    Electrophysiologist:  n/a  Chief Complaint  Patient presents with  . Coronary Artery Disease     History of Present Illness: [name] [name] is a 59 y.o. female with a hx of diabetes, HTN, and CAD. She is [place] and speaks little [place]. Patient was admitted in 11/14 with inferior STEMI. She had DES to mLCx. EF was preserved. Last seen by Dr. Aundra Dubin 06/2014. At that time, he decided to keep her on long-term dual antiplatelet therapy. Effient was stopped and she was placed on Plavix in addition to aspirin.  She returns for FU.  Here with son and interpreter.  The patient denies chest pain, shortness of breath, syncope, orthopnea, PND or significant pedal edema.    Studies/Reports Reviewed Today:  Echo 05/15/13 Mild LVH, EF 55-60%, normal wall motion, mild MR, mild LAE, redundant atrial septum but no obvious PFO/ASD  LHC 05/15/13 Final Conclusions:  1. Severe one-vessel coronary artery disease with 99% hazy mid left circumflex stenosis which is the culprit for inferior ST elevation MI. 2. Normal LV systolic function and mildly elevated left ventricular end-diastolic pressure. 3. Successful angioplasty and drug-eluting stent placement to the left circumflex artery.   Past Medical History  Diagnosis Date  . CAD (coronary artery disease)     a. 04/2013 Inf STEMI/PCI: LM nl, LAD min irregs, D1/2/3 nl, LCX 15m (2.5x18 Xience DES), OM1/2/3 nl, RCA nl, PDA nl, RPAV nl, RPL nl, EF 55%;  b. 04/2013 Echo: EF 55-60%, no rwma, mild MR, mildly dil LA.  Marland Kitchen Hypertension   . Hyperlipidemia   . Diabetes mellitus type 2, uncontrolled     a. 04/2013 HbA1c = 11.7.  1. Type II diabetes 2. HTN 3. CAD: Inferior STEMI 11/14. LHC (11/14) with 99% mLCx stenosis treated with Xience DES. EF 55%. Echo (11/14) with EF 55-60%, mild LVH,  mild MR.  4. Hyperlipidemia 5. OA left knee 6. HTN   Past Surgical History  Procedure Laterality Date  . Cesarean section    . Left heart cath  05-15-13  . Left heart catheterization with coronary angiogram N/A 05/15/2013    Procedure: LEFT HEART CATHETERIZATION WITH CORONARY ANGIOGRAM;  Surgeon: Wellington Hampshire, MD;  Location: [facility] [facility];  Service: Cardiovascular;  Laterality: N/A;  . Percutaneous stent intervention  05/15/2013    Procedure: PERCUTANEOUS STENT INTERVENTION;  Surgeon: Wellington Hampshire, MD;  Location: [facility];  Service: Cardiovascular;;     Current Outpatient Prescriptions  Medication Sig Dispense Refill  . acarbose (PRECOSE) 25 MG tablet Take 1 tablet (25 mg total) by mouth 3 (three) times daily with meals. 90 tablet 3  . aspirin EC 81 MG tablet Take 1 tablet (81 mg total) by mouth daily.    Marland Kitchen atorvastatin (LIPITOR) 80 MG tablet Take 1 tablet (80 mg total) by mouth daily. 90 tablet 3  . canagliflozin (INVOKANA) 100 MG TABS tablet Take 1 tablet (100 mg total) by mouth daily. 30 tablet 3  . clopidogrel (PLAVIX) 75 MG tablet take 1 tablet by mouth once daily 90 tablet 0  . glucose blood (ACCU-CHEK SMARTVIEW) test strip Use as instructed to check blood sugar 3 times per day dx code E11.9 100 each 3  . lisinopril (PRINIVIL,ZESTRIL) 10 MG tablet take 1 tablet by mouth once daily 90 tablet 0  .  metFORMIN (GLUCOPHAGE-XR) 500 MG 24 hr tablet Take 3 tablets daily 90 tablet 2  . metoprolol tartrate (LOPRESSOR) 25 MG tablet Take 0.5 tablets (12.5 mg total) by mouth 2 (two) times daily. (Patient not taking: Reported on 01/18/2015) 120 tablet 3  . nitroGLYCERIN (NITROSTAT) 0.4 MG SL tablet Place 1 tablet (0.4 mg total) under the tongue every 5 (five) minutes as needed for chest pain. (Patient not taking: Reported on 01/18/2015) 25 tablet 3   No current facility-administered medications for this visit.    Allergies:   Review of patient's allergies indicates no known allergies.      Social History:  The patient  reports that she has never smoked. She has never used smokeless tobacco. She reports that she does not drink alcohol or use illicit drugs.   Family History:  The patient's family history includes Diabetes in her brother, father, and mother; Heart disease in her father and mother; Hypertension in her father and mother.    ROS:   Please see the history of present illness.   Review of Systems  All other systems reviewed and are negative.     PHYSICAL EXAM: VS:  BP 112/60 mmHg  Pulse 75  Ht 4\' 11"  (1.499 m)  Wt 138 lb 6.4 oz (62.778 kg)  BMI 27.94 kg/m2    Wt Readings from Last 3 Encounters:  01/18/15 138 lb 6.4 oz (62.778 kg)  01/08/15 138 lb 3.2 oz (62.687 kg)  11/24/14 138 lb (62.596 kg)     GEN: Well nourished, well developed, in no acute distress HEENT: normal Neck: no JVD, no carotid bruits, no masses Cardiac:  Normal S1/S2, RRR; no murmur ,  no rubs or gallops, no edema   Respiratory:  clear to auscultation bilaterally, no wheezing, rhonchi or rales. GI: soft, nontender, nondistended, + BS MS: no deformity or atrophy Skin: warm and dry  Neuro:  CNs II-XII intact, Strength and sensation are intact Psych: Normal affect   EKG:  EKG is ordered today.  It demonstrates:   NSR, HR 75, nonspecific ST-T wave changes, no change from prior tracing   Recent Labs: 08/03/2014: Hemoglobin 12.7; Platelets 254.0 01/02/2015: ALT 12; BUN 10; Creatinine, Ser 0.72; Potassium 4.5; Sodium 138    Lipid Panel    Component Value Date/Time   CHOL 84 09/28/2014 0910   TRIG 85.0 09/28/2014 0910   HDL 30.00* 09/28/2014 0910   CHOLHDL 3 09/28/2014 0910   VLDL 17.0 09/28/2014 0910   LDLCALC 37 09/28/2014 0910      ASSESSMENT AND PLAN:  Coronary artery disease involving native coronary artery of native heart without angina pectoris:  No angina.  Continue ASA, Plavix, statin, ACE inhibitor, beta-blocker.  She has been off of Metoprolol for unclear  reasons. Resume Metoprolol Tartrate at previous dose of 12.5 mg Twice daily.  Essential hypertension:  Controlled.   Hyperlipidemia:  Recent LDL 37.  Continue statin.   Diabetes mellitus type 2, controlled:  Recent A1c 7.1.  FU with endocrine.     Medication Changes: Current medicines are reviewed at length with the patient today.  Concerns regarding medicines are as outlined above.  The following changes have been made:   Discontinued Medications   INSULIN NPH-REGULAR (NOVOLIN 70/30) (70-30) 100 UNIT/ML INJECTION    Inject 10 Units into the skin 2 (two) times daily with a meal.    INSULIN PEN NEEDLE (BD PEN NEEDLE NANO U/F) 32G X 4 MM MISC    Use 1 needle per day with Victoza  METOPROLOL TARTRATE (LOPRESSOR) 25 MG TABLET    take 1/2 tablet by mouth twice a day   NOVOLOG MIX 70/30 FLEXPEN (70-30) 100 UNIT/ML FLEXPEN    Injects 10 units in the morning and 8 units at night   Modified Medications   Modified Medication Previous Medication   ATORVASTATIN (LIPITOR) 80 MG TABLET atorvastatin (LIPITOR) 80 MG tablet      Take 1 tablet (80 mg total) by mouth daily.    Take 1 tablet (80 mg total) by mouth daily.   New Prescriptions   METOPROLOL TARTRATE (LOPRESSOR) 25 MG TABLET    Take 0.5 tablets (12.5 mg total) by mouth 2 (two) times daily.    Labs/ tests ordered today include:   Orders Placed This Encounter  Procedures  . EKG 12-Lead     Disposition:   FU with Dr. Loralie Champagne 6 mos.    Signed, Versie Starks, MHS 01/18/2015 10:42 AM    [facility] [place], [place], [place]  [address] Phone: [phone]; Fax: [phone]

## 2015-01-18 ENCOUNTER — Ambulatory Visit (INDEPENDENT_AMBULATORY_CARE_PROVIDER_SITE_OTHER): Payer: Medicaid Other | Admitting: Physician Assistant

## 2015-01-18 ENCOUNTER — Encounter: Payer: Self-pay | Admitting: Physician Assistant

## 2015-01-18 VITALS — BP 112/60 | HR 75 | Ht 59.0 in | Wt 138.4 lb

## 2015-01-18 DIAGNOSIS — I2510 Atherosclerotic heart disease of native coronary artery without angina pectoris: Secondary | ICD-10-CM | POA: Diagnosis not present

## 2015-01-18 DIAGNOSIS — E119 Type 2 diabetes mellitus without complications: Secondary | ICD-10-CM

## 2015-01-18 DIAGNOSIS — E785 Hyperlipidemia, unspecified: Secondary | ICD-10-CM

## 2015-01-18 DIAGNOSIS — I10 Essential (primary) hypertension: Secondary | ICD-10-CM | POA: Diagnosis not present

## 2015-01-18 MED ORDER — METOPROLOL TARTRATE 25 MG PO TABS: 12.5000 mg | ORAL_TABLET | Freq: Two times a day (BID) | ORAL | Status: DC

## 2015-01-18 MED ORDER — ATORVASTATIN CALCIUM 80 MG PO TABS: 80.0000 mg | ORAL_TABLET | Freq: Every day | ORAL | Status: DC

## 2015-01-18 NOTE — Patient Instructions (Addendum)
Medication Instructions:  1. REFILL FOR LIPITOR WAS SENT IN  2. RE-START METOPROLOL TARTRATE 25 MG WITH THE DIRECTIONS TO TAKE 1/2 TAB TWICE DAILY = 12.5 MG TWICE DAILY  Labwork: NONE  Testing/Procedures: NONE  Follow-Up: Your physician wants you to follow-up in: [name] You will receive a reminder letter in the mail two months in advance. If you don't receive a letter, please call our office to schedule the follow-up appointment.   Any Other Special Instructions Will Be Listed Below (If Applicable).

## 2015-02-12 ENCOUNTER — Other Ambulatory Visit: Payer: Self-pay | Admitting: *Deleted

## 2015-02-12 MED ORDER — CANAGLIFLOZIN 100 MG PO TABS: 100.0000 mg | ORAL_TABLET | Freq: Every day | ORAL | Status: DC

## 2015-02-22 ENCOUNTER — Other Ambulatory Visit: Payer: Self-pay | Admitting: Endocrinology

## 2015-03-26 ENCOUNTER — Other Ambulatory Visit: Payer: Self-pay | Admitting: Cardiology

## 2015-03-26 ENCOUNTER — Other Ambulatory Visit: Payer: Self-pay | Admitting: Endocrinology

## 2015-04-09 ENCOUNTER — Other Ambulatory Visit (INDEPENDENT_AMBULATORY_CARE_PROVIDER_SITE_OTHER): Payer: Medicaid Other

## 2015-04-09 DIAGNOSIS — IMO0002 Reserved for concepts with insufficient information to code with codable children: Secondary | ICD-10-CM

## 2015-04-09 DIAGNOSIS — E1165 Type 2 diabetes mellitus with hyperglycemia: Secondary | ICD-10-CM

## 2015-04-09 LAB — HEMOGLOBIN A1C: Hgb A1c MFr Bld: 7.5 % — ABNORMAL HIGH (ref 4.6–6.5)

## 2015-04-09 LAB — BASIC METABOLIC PANEL
BUN: 10 mg/dL (ref 6–23)
CHLORIDE: 103 meq/L (ref 96–112)
CO2: 29 meq/L (ref 19–32)
Calcium: 9.9 mg/dL (ref 8.4–10.5)
Creatinine, Ser: 0.72 mg/dL (ref 0.40–1.20)
GFR: 87.55 mL/min (ref >60.00)
GLUCOSE: 147 mg/dL — AB (ref 70–99)
POTASSIUM: 4.3 meq/L (ref 3.5–5.1)
SODIUM: 139 meq/L (ref 135–145)

## 2015-04-12 ENCOUNTER — Ambulatory Visit (INDEPENDENT_AMBULATORY_CARE_PROVIDER_SITE_OTHER): Payer: Medicaid Other | Admitting: Endocrinology

## 2015-04-12 ENCOUNTER — Encounter: Payer: Self-pay | Admitting: Endocrinology

## 2015-04-12 VITALS — BP 120/72 | HR 87 | Temp 98.3°F | Resp 14 | Ht 59.0 in | Wt 133.0 lb

## 2015-04-12 DIAGNOSIS — I10 Essential (primary) hypertension: Secondary | ICD-10-CM

## 2015-04-12 DIAGNOSIS — Z23 Encounter for immunization: Secondary | ICD-10-CM

## 2015-04-12 DIAGNOSIS — E1165 Type 2 diabetes mellitus with hyperglycemia: Secondary | ICD-10-CM | POA: Diagnosis not present

## 2015-04-12 NOTE — Progress Notes (Signed)
Patient ID: [name] [name], female   DOB: May 09, 1956, 59 y.o.   MRN: [id number]           Reason for Appointment: Follow-up for Type 2 Diabetes   History of Present Illness:          Diagnosis: Type 2 diabetes mellitus, date of diagnosis: ?  2014   Past history:  The patient gives an inconsistent history. Her son thinks that she may have had diabetes diagnosed when she was in [place] about 2 years ago and was given unknown medications. When she moved to [facility] in 2014 she will not on any medications She was however found to have significant hyperglycemia with glucose 364 and A1c of 11.7 when she was admitted for her coronary artery disease in 04/2013. At that time she was started on premixed insulin twice a day Her level of control had not been adequate with persistently high A1c readings She did not tolerate Victoza well and was having decreased appetite, malaise and nausea and this was stopped subsequently  Recent history:        Oral hypoglycemic drugs :   Metformin ER 500 mg 3 tablets daily, Invokana 100 mg daily, Precose 25 mg before meals 3 times a day     She is on a 3 drug regimen now and has benefited from adding Invokana 100 mg With this she was able to taper off her insulin. Also her postprandial readings have improved somewhat with adding Precose before meals on her visit in 6/16 She has taken this without any side effects and is taking it just before each meal as directed  Current blood sugar patterns and problems:  Her blood sugars are  Mildly high at all times   highest blood sugars are probably after lunch  She has somewhat higher blood sugars after lunch and sometimes after dinner  She now says that she is only eating bread and vegetables at lunch time and since she does not tolerate yogurt she does not always get protein She thinks some of the high sugars are related to eating more carbohydrate or occasional sweets    Hypoglycemia: None    Side  effects from medications have been: None  Self-care: The diet that the patient has been following is: None, has vegetarian diet Usually having vegetables and roti with lunch and dinner and sometimes having yogurt or lentils.  Adding almonds for protein especially in the morning  Usually not eating fried food except for some snacks            Exercise:  walking almost daily either indoors or outdoors up to one hour         Dietician visit, none  Compliance with the medical regimen: Fair  Glucose monitoring:  done 2-3 times a day         Glucometer: Accu-Chek  Blood Glucose readings by meter download :  Mean values apply above for all meters except median for One Touch  PRE-MEAL Fasting Lunch Dinner Bedtime Overall  Glucose range:  109-145       Mean/median:    129      151   POST-MEAL PC Breakfast PC Lunch PC Dinner  Glucose range:  130-165   152-222   123-185   Mean/median:   147  174  150    Weight history:   Wt Readings from Last 3 Encounters:  04/12/15 133 lb (60.328 kg)  01/18/15 138 lb 6.4 oz (62.778 kg)  01/08/15 138 lb  3.2 oz (62.687 kg)    Glycemic control:   Lab Results  Component Value Date   HGBA1C 7.5* 04/09/2015   HGBA1C 7.1* 01/02/2015   HGBA1C 7.3* 10/09/2014   Lab Results  Component Value Date   MICROALBUR 1.3 10/09/2014   LDLCALC 37 09/28/2014   CREATININE 0.72 04/09/2015         Medication List       This list is accurate as of: 04/12/15 11:28 AM.  Always use your most recent med list.               acarbose 25 MG tablet  Commonly known as:  PRECOSE  take 1 tablet by mouth three times a day with meals     aspirin EC 81 MG tablet  Take 1 tablet (81 mg total) by mouth daily.     atorvastatin 80 MG tablet  Commonly known as:  LIPITOR  Take 1 tablet (80 mg total) by mouth daily.     canagliflozin 100 MG Tabs tablet  Commonly known as:  INVOKANA  Take 1 tablet (100 mg total) by mouth daily.     clopidogrel 75 MG tablet    Commonly known as:  PLAVIX  take 1 tablet by mouth once daily     glucose blood test strip  Commonly known as:  ACCU-CHEK SMARTVIEW  Use as instructed to check blood sugar 3 times per day dx code E11.9     lisinopril 10 MG tablet  Commonly known as:  PRINIVIL,ZESTRIL  take 1 tablet by mouth once daily     metFORMIN 500 MG 24 hr tablet  Commonly known as:  GLUCOPHAGE-XR  take 3 tablets by mouth once daily     metoprolol tartrate 25 MG tablet  Commonly known as:  LOPRESSOR  Take 0.5 tablets (12.5 mg total) by mouth 2 (two) times daily.     nitroGLYCERIN 0.4 MG SL tablet  Commonly known as:  NITROSTAT  Place 1 tablet (0.4 mg total) under the tongue every 5 (five) minutes as needed for chest pain.     ranitidine 75 MG tablet  Commonly known as:  ZANTAC  Take 75 mg by mouth 2 (two) times daily.        Allergies: No Known Allergies  Past Medical History  Diagnosis Date  . CAD (coronary artery disease)     a. 04/2013 Inf STEMI/PCI: LM nl, LAD min irregs, D1/2/3 nl, LCX 82m (2.5x18 Xience DES), OM1/2/3 nl, RCA nl, PDA nl, RPAV nl, RPL nl, EF 55%;  b. 04/2013 Echo: EF 55-60%, no rwma, mild MR, mildly dil LA.  Marland Kitchen Hypertension   . Hyperlipidemia   . Diabetes mellitus type 2, uncontrolled ([place])     a. 04/2013 HbA1c = 11.7.    Past Surgical History  Procedure Laterality Date  . Cesarean section    . Left heart cath  05-15-13  . Left heart catheterization with coronary angiogram N/A 05/15/2013    Procedure: LEFT HEART CATHETERIZATION WITH CORONARY ANGIOGRAM;  Surgeon: Wellington Hampshire, MD;  Location: [place] CATH LAB;  Service: Cardiovascular;  Laterality: N/A;  . Percutaneous stent intervention  05/15/2013    Procedure: PERCUTANEOUS STENT INTERVENTION;  Surgeon: Wellington Hampshire, MD;  Location: [place] CATH LAB;  Service: Cardiovascular;;    Family History  Problem Relation Age of Onset  . Heart disease Mother   . Diabetes Mother   . Hypertension Mother   . Heart disease Father    . Diabetes Father   .  Hypertension Father   . Diabetes Brother     Social History:  reports that she has never smoked. She has never used smokeless tobacco. She reports that she does not drink alcohol or use illicit drugs.    Review of Systems        Lipids: She has a prescription for Lipitor from PCP.  Lipids are well controlled, has history of CAD       Lab Results  Component Value Date   CHOL 84 09/28/2014   HDL 30.00* 09/28/2014   LDLCALC 37 09/28/2014   TRIG 85.0 09/28/2014   CHOLHDL 3 09/28/2014                   The blood pressure has been controlled with lisinopril with no hyperkalemia or renal dysfunction    LABS:  Appointment on 04/09/2015  Component Date Value Ref Range Status  . Hgb A1c MFr Bld 04/09/2015 7.5* 4.6 - 6.5 % Final   Glycemic Control Guidelines for People with Diabetes:Non Diabetic:  <6%Goal of Therapy: <7%Additional Action Suggested:  >8%   . Sodium 04/09/2015 139  135 - 145 mEq/L Final  . Potassium 04/09/2015 4.3  3.5 - 5.1 mEq/L Final  . Chloride 04/09/2015 103  96 - 112 mEq/L Final  . CO2 04/09/2015 29  19 - 32 mEq/L Final  . Glucose, Bld 04/09/2015 147* 70 - 99 mg/dL Final  . BUN 04/09/2015 10  6 - 23 mg/dL Final  . Creatinine, Ser 04/09/2015 0.72  0.40 - 1.20 mg/dL Final  . Calcium 04/09/2015 9.9  8.4 - 10.5 mg/dL Final  . GFR 04/09/2015 87.55  >60.00 mL/min Final    Physical Examination:  BP 120/72 mmHg  Pulse 87  Temp(Src) 98.3 F (36.8 C)  Resp 14  Ht 4\' 11"  (1.499 m)  Wt 133 lb (60.328 kg)  BMI 26.85 kg/m2  SpO2 97%  Repeat blood pressure 120/72  ASSESSMENT:  Diabetes type 2, uncontrolled with obesity  She has had fairly good blood sugar control although A1c is relatively higher than the last time Previously had improved blood sugars with using Invokana and also Precose before meals without any side effects She is also now losing weight More recently her blood sugars tend to be higher after lunch than in the evening  probably because of inconsistent amount of protein at meal times  PLAN:   Consistent protein with every meal especially lunchtime and discussed examples of protein sources  Additional 25 mg of  Precose at lunchtime  Consider increasing dose of Invokana if A1c stays high on the next visit   Patient Instructions  Have lentils, beans at lunch  Check blood sugars on waking up 2-3  times a week Also check blood sugars about 2 hours after a meal and do this after different meals by rotation  Recommended blood sugar levels on waking up is 90-130 and about 2 hours after meal is 130-160  Please bring your blood sugar monitor to each visit, thank you  Take 2 Acarbose at lunch      [facility] 04/12/2015, 11:28 AM   Note: This office note was prepared with Estate agent. Any transcriptional errors that result from this process are unintentional.

## 2015-04-12 NOTE — Patient Instructions (Signed)
Have lentils, beans at lunch  Check blood sugars on waking up 2-3  times a week Also check blood sugars about 2 hours after a meal and do this after different meals by rotation  Recommended blood sugar levels on waking up is 90-130 and about 2 hours after meal is 130-160  Please bring your blood sugar monitor to each visit, thank you  Take 2 Acarbose at lunch

## 2015-05-29 ENCOUNTER — Other Ambulatory Visit: Payer: Self-pay | Admitting: Endocrinology

## 2015-06-06 ENCOUNTER — Other Ambulatory Visit: Payer: Self-pay | Admitting: Endocrinology

## 2015-06-25 ENCOUNTER — Other Ambulatory Visit: Payer: Self-pay | Admitting: Cardiology

## 2015-06-25 ENCOUNTER — Other Ambulatory Visit: Payer: Self-pay | Admitting: Endocrinology

## 2015-07-05 ENCOUNTER — Telehealth: Payer: Self-pay | Admitting: Endocrinology

## 2015-07-05 ENCOUNTER — Other Ambulatory Visit: Payer: Self-pay | Admitting: *Deleted

## 2015-07-05 NOTE — Telephone Encounter (Signed)
Patients son called stating that his mothers medication has not reached the pharmacy  I advised that on 1.9.17 we have confirmation that the request was sent   Rx: Acarbose  Accu-Check test strips   Pharmacy: [place]   Thank you

## 2015-07-06 NOTE — Telephone Encounter (Signed)
Son calling again for refill of acarbose and the test strips, son is to call the insurance to find out what will be covered regarding test strips

## 2015-07-06 NOTE — Telephone Encounter (Signed)
Pharmacy has already received the acarbose, they are waiting for an rx for strips as soon as the son finds out what is covered.

## 2015-07-10 ENCOUNTER — Other Ambulatory Visit (INDEPENDENT_AMBULATORY_CARE_PROVIDER_SITE_OTHER): Payer: Medicaid Other

## 2015-07-10 DIAGNOSIS — E1165 Type 2 diabetes mellitus with hyperglycemia: Secondary | ICD-10-CM | POA: Diagnosis not present

## 2015-07-10 LAB — COMPREHENSIVE METABOLIC PANEL
ALBUMIN: 4.1 g/dL (ref 3.5–5.2)
ALK PHOS: 46 U/L (ref 39–117)
ALT: 14 U/L (ref 0–35)
AST: 16 U/L (ref 0–37)
BUN: 11 mg/dL (ref 6–23)
CO2: 26 mEq/L (ref 19–32)
CREATININE: 0.74 mg/dL (ref 0.40–1.20)
Calcium: 9.4 mg/dL (ref 8.4–10.5)
Chloride: 106 mEq/L (ref 96–112)
GFR: 84.75 mL/min (ref >60.00)
GLUCOSE: 114 mg/dL — AB (ref 70–99)
Potassium: 4.6 mEq/L (ref 3.5–5.1)
SODIUM: 139 meq/L (ref 135–145)
TOTAL PROTEIN: 7 g/dL (ref 6.0–8.3)
Total Bilirubin: 0.5 mg/dL (ref 0.2–1.2)

## 2015-07-10 LAB — HEMOGLOBIN A1C: HEMOGLOBIN A1C: 7.2 % — AB (ref 4.6–6.5)

## 2015-07-13 ENCOUNTER — Encounter: Payer: Self-pay | Admitting: Endocrinology

## 2015-07-13 ENCOUNTER — Ambulatory Visit (INDEPENDENT_AMBULATORY_CARE_PROVIDER_SITE_OTHER): Payer: Medicaid Other | Admitting: Endocrinology

## 2015-07-13 VITALS — BP 120/76 | HR 75 | Temp 97.9°F | Resp 14 | Ht 59.0 in | Wt 131.8 lb

## 2015-07-13 DIAGNOSIS — E1165 Type 2 diabetes mellitus with hyperglycemia: Secondary | ICD-10-CM | POA: Diagnosis not present

## 2015-07-13 NOTE — Patient Instructions (Signed)
Check blood sugars on waking up 2  times a week  Also check blood sugars about 2 hours after a meal and do this after different meals by rotation  Recommended blood sugar levels on waking up is 90-130 and about 2 hours after meal is 130-160  Please bring your blood sugar monitor to each visit, thank you  

## 2015-07-13 NOTE — Progress Notes (Signed)
Patient ID: [name] [name], female   DOB: 1955-06-28, 61 y.o.   MRN: [id number]           Reason for Appointment: Follow-up for Type 2 Diabetes   History of Present Illness:          Diagnosis: Type 2 diabetes mellitus, date of diagnosis: ?  2014   Past history:  The patient gives an inconsistent history. Her son thinks that she may have had diabetes diagnosed when she was in [place] about 2 years ago and was given unknown medications. When she moved to [facility] in 2014 she will not on any medications She was however found to have significant hyperglycemia with glucose 364 and A1c of 11.7 when she was admitted for her coronary artery disease in 04/2013. At that time she was started on premixed insulin twice a day Her level of control had not been adequate with persistently high A1c readings She did not tolerate Victoza well and was having decreased appetite, malaise and nausea and this was stopped subsequently  Recent history:        Oral hypoglycemic drugs:     Metformin ER 500 mg 3 tablets daily, Invokana 100 mg daily, Precose 25 mg before meals 3 times a day, 50 mg at lunch     She is on a 3 drug regimen and has relatively better controlled now Previously had been on insulin However not clear why her A1c stays over 7%  Since she was having relatively higher postprandial readings after lunch her Precose was increased to 2 tablets at lunch time on her last visit However she has not been able to get this for the last 3 weeks because of insurance denial She has taken this without any side effects and is taking it just before each meal as directed  Current blood sugar patterns and problems:  Her blood sugars are excellent in the morning.  Not clear why she had to readings of 67 but she had no symptoms except coldness of her hands  She has not checked her blood sugar with her Accu-Chek monitor recently because of insurance is refusing to cover the strips according to the  drugstore  Also has not checked many readings after meals  Most of the higher readings are appearing to be around lunchtime  Blood sugars at bedtime are mildly increased recently  Has only one reading that is high in the afternoon but these are checked sporadically; she was asked to add more protein to her lunch previously  Probably her higher readings are related to eating more carbohydrate or some sweets    Hypoglycemia: None    Side effects from medications have been: None  Self-care: The diet that the patient has been following is: None, has vegetarian diet Usually having vegetables and roti with lunch and dinner and sometimes having yogurt or lentils.  Adding almonds for protein especially in the morning  Usually not eating fried food except for some snacks            Exercise:  walking almost daily either indoors or outdoors up to one hour         Dietician visit, none  Compliance with the medical regimen: Fair  Glucose monitoring:  done 2-3 times a day         Glucometer: Accu-Chek  Blood Glucose readings by meter download :  Mean values apply above for all meters except median for One Touch  PRE-MEAL Fasting Lunch Dinner Bedtime Overall  Glucose  range: 67-118 139-175 120-172 128-168   Mean/median:  150  145 133   Weight history:   Wt Readings from Last 3 Encounters:  07/13/15 131 lb 12.8 oz (59.784 kg)  04/12/15 133 lb (60.328 kg)  01/18/15 138 lb 6.4 oz (62.778 kg)    Glycemic control:   Lab Results  Component Value Date   HGBA1C 7.2* 07/10/2015   HGBA1C 7.5* 04/09/2015   HGBA1C 7.1* 01/02/2015   Lab Results  Component Value Date   MICROALBUR 1.3 10/09/2014   LDLCALC 37 09/28/2014   CREATININE 0.74 07/10/2015         Medication List       This list is accurate as of: 07/13/15 12:02 PM.  Always use your most recent med list.               acarbose 25 MG tablet  Commonly known as:  PRECOSE  take 1 tablet by mouth three times a day with  meals     ACCU-CHEK SMARTVIEW test strip  Generic drug:  glucose blood  USE AS DIRECTED. TEST 3 TIMES A DAY     aspirin EC 81 MG tablet  Take 1 tablet (81 mg total) by mouth daily.     atorvastatin 80 MG tablet  Commonly known as:  LIPITOR  Take 1 tablet (80 mg total) by mouth daily.     clopidogrel 75 MG tablet  Commonly known as:  PLAVIX  take 1 tablet by mouth once daily     INVOKANA 100 MG Tabs tablet  Generic drug:  canagliflozin  take 1 tablet by mouth once daily     lisinopril 10 MG tablet  Commonly known as:  PRINIVIL,ZESTRIL  take 1 tablet by mouth once daily     metFORMIN 500 MG 24 hr tablet  Commonly known as:  GLUCOPHAGE-XR  take 3 tablets by mouth once daily     metoprolol tartrate 25 MG tablet  Commonly known as:  LOPRESSOR  Take 0.5 tablets (12.5 mg total) by mouth 2 (two) times daily.     nitroGLYCERIN 0.4 MG SL tablet  Commonly known as:  NITROSTAT  Place 1 tablet (0.4 mg total) under the tongue every 5 (five) minutes as needed for chest pain.     ranitidine 75 MG tablet  Commonly known as:  ZANTAC  Take 75 mg by mouth 2 (two) times daily.        Allergies: No Known Allergies  Past Medical History  Diagnosis Date  . CAD (coronary artery disease)     a. 04/2013 Inf STEMI/PCI: LM nl, LAD min irregs, D1/2/3 nl, LCX 85m (2.5x18 Xience DES), OM1/2/3 nl, RCA nl, PDA nl, RPAV nl, RPL nl, EF 55%;  b. 04/2013 Echo: EF 55-60%, no rwma, mild MR, mildly dil LA.  Marland Kitchen Hypertension   . Hyperlipidemia   . Diabetes mellitus type 2, uncontrolled ([place])     a. 04/2013 HbA1c = 11.7.    Past Surgical History  Procedure Laterality Date  . Cesarean section    . Left heart cath  05-15-13  . Left heart catheterization with coronary angiogram N/A 05/15/2013    Procedure: LEFT HEART CATHETERIZATION WITH CORONARY ANGIOGRAM;  Surgeon: Wellington Hampshire, MD;  Location: [place] CATH LAB;  Service: Cardiovascular;  Laterality: N/A;  . Percutaneous stent intervention  05/15/2013      Procedure: PERCUTANEOUS STENT INTERVENTION;  Surgeon: Wellington Hampshire, MD;  Location: [place] CATH LAB;  Service: Cardiovascular;;    Family History  Problem Relation Age of Onset  . Heart disease Mother   . Diabetes Mother   . Hypertension Mother   . Heart disease Father   . Diabetes Father   . Hypertension Father   . Diabetes Brother     Social History:  reports that she has never smoked. She has never used smokeless tobacco. She reports that she does not drink alcohol or use illicit drugs.    Review of Systems        Lipids: She has a prescription for Lipitor from PCP.  Lipids are well controlled, has history of CAD       Lab Results  Component Value Date   CHOL 84 09/28/2014   HDL 30.00* 09/28/2014   LDLCALC 37 09/28/2014   TRIG 85.0 09/28/2014   CHOLHDL 3 09/28/2014                   The blood pressure has been controlled with lisinopril     LABS:  Lab on 07/10/2015  Component Date Value Ref Range Status  . Sodium 07/10/2015 139  135 - 145 mEq/L Final  . Potassium 07/10/2015 4.6  3.5 - 5.1 mEq/L Final  . Chloride 07/10/2015 106  96 - 112 mEq/L Final  . CO2 07/10/2015 26  19 - 32 mEq/L Final  . Glucose, Bld 07/10/2015 114* 70 - 99 mg/dL Final  . BUN 07/10/2015 11  6 - 23 mg/dL Final  . Creatinine, Ser 07/10/2015 0.74  0.40 - 1.20 mg/dL Final  . Total Bilirubin 07/10/2015 0.5  0.2 - 1.2 mg/dL Final  . Alkaline Phosphatase 07/10/2015 46  39 - 117 U/L Final  . AST 07/10/2015 16  0 - 37 U/L Final  . ALT 07/10/2015 14  0 - 35 U/L Final  . Total Protein 07/10/2015 7.0  6.0 - 8.3 g/dL Final  . Albumin 07/10/2015 4.1  3.5 - 5.2 g/dL Final  . Calcium 07/10/2015 9.4  8.4 - 10.5 mg/dL Final  . GFR 07/10/2015 84.75  >60.00 mL/min Final  . Hgb A1c MFr Bld 07/10/2015 7.2* 4.6 - 6.5 % Final   Glycemic Control Guidelines for People with Diabetes:Non Diabetic:  <6%Goal of Therapy: <7%Additional Action Suggested:  >8%     Physical Examination:  BP 120/76 mmHg  Pulse 75   Temp(Src) 97.9 F (36.6 C)  Resp 14  Ht 4\' 11"  (1.499 m)  Wt 131 lb 12.8 oz (59.784 kg)  BMI 26.61 kg/m2  SpO2 97%    ASSESSMENT:  Diabetes type 2, uncontrolled with obesity  She has had fairly good blood sugar control although A1c is still over 7%, slightly better now at 7.2 Again her blood sugars are difficult to control after meals because of relatively higher carbohydrate and low protein diet More recently is having difficulty getting her medications covered by Medicaid Blood sugar monitoring has also been somewhat irregular especially after meals However compared to last year her weight is down   PLAN:   She will check with her insurance about coverage for medications and test strips  Restart Precose when available  Consider increasing dose of Invokana if A1c stays high on the next visit   Patient Instructions  Check blood sugars on waking up  2  times a week Also check blood sugars about 2 hours after a meal and do this after different meals by rotation  Recommended blood sugar levels on waking up is 90-130 and about 2 hours after meal is 130-160  Please bring your  blood sugar monitor to each visit, thank you     [facility] 07/13/2015, 12:02 PM   Note: This office note was prepared with Dragon voice recognition system technology. Any transcriptional errors that result from this process are unintentional.

## 2015-07-18 ENCOUNTER — Other Ambulatory Visit: Payer: Self-pay | Admitting: *Deleted

## 2015-07-18 ENCOUNTER — Telehealth: Payer: Self-pay | Admitting: Endocrinology

## 2015-07-18 MED ORDER — CANAGLIFLOZIN 100 MG PO TABS
100.0000 mg | ORAL_TABLET | Freq: Every day | ORAL | Status: DC
Start: 2015-07-18 — End: 2015-10-11

## 2015-07-18 MED ORDER — GLUCOSE BLOOD VI STRP: ORAL_STRIP | Status: DC

## 2015-07-18 NOTE — Telephone Encounter (Signed)
Those were sent this morning, receipt confirmed by pharmacy at 9:23 am.

## 2015-07-18 NOTE — Telephone Encounter (Signed)
rx's have been re-sent.  Son was suppose to call her insurance to find out what strips are covered!

## 2015-07-18 NOTE — Telephone Encounter (Signed)
Patients son called stating that [name] [name] is needing a refill on her medications   Rx: Invokana  Test strips   Pharmacy: [place]   Thank you

## 2015-07-18 NOTE — Telephone Encounter (Signed)
Caller mother states mother's 2 rx out of refills.  Diabetics Dr. Did send them to the pharmacy last week when called. Invokana and glucose strips 07/17/15 5:23

## 2015-08-23 ENCOUNTER — Other Ambulatory Visit: Payer: Self-pay | Admitting: Endocrinology

## 2015-09-13 ENCOUNTER — Other Ambulatory Visit: Payer: Self-pay | Admitting: Cardiology

## 2015-10-11 ENCOUNTER — Encounter: Payer: Self-pay | Admitting: Endocrinology

## 2015-10-11 ENCOUNTER — Ambulatory Visit (INDEPENDENT_AMBULATORY_CARE_PROVIDER_SITE_OTHER): Payer: Medicaid Other | Admitting: Endocrinology

## 2015-10-11 ENCOUNTER — Other Ambulatory Visit: Payer: Self-pay | Admitting: *Deleted

## 2015-10-11 VITALS — BP 108/72 | HR 71 | Temp 98.5°F | Resp 14 | Ht 59.0 in | Wt 130.8 lb

## 2015-10-11 DIAGNOSIS — E1165 Type 2 diabetes mellitus with hyperglycemia: Secondary | ICD-10-CM

## 2015-10-11 DIAGNOSIS — E119 Type 2 diabetes mellitus without complications: Secondary | ICD-10-CM | POA: Diagnosis not present

## 2015-10-11 LAB — COMPREHENSIVE METABOLIC PANEL
ALBUMIN: 4.1 g/dL (ref 3.5–5.2)
ALT: 12 U/L (ref 0–35)
AST: 12 U/L (ref 0–37)
Alkaline Phosphatase: 39 U/L (ref 39–117)
BUN: 13 mg/dL (ref 6–23)
CHLORIDE: 103 meq/L (ref 96–112)
CO2: 29 meq/L (ref 19–32)
CREATININE: 0.7 mg/dL (ref 0.40–1.20)
Calcium: 9.7 mg/dL (ref 8.4–10.5)
GFR: 90.29 mL/min (ref >60.00)
Glucose, Bld: 127 mg/dL — ABNORMAL HIGH (ref 70–99)
POTASSIUM: 4.5 meq/L (ref 3.5–5.1)
SODIUM: 137 meq/L (ref 135–145)
Total Bilirubin: 0.6 mg/dL (ref 0.2–1.2)
Total Protein: 7.2 g/dL (ref 6.0–8.3)

## 2015-10-11 LAB — LIPID PANEL
CHOL/HDL RATIO: 3
CHOLESTEROL: 107 mg/dL (ref 0–200)
HDL: 40.5 mg/dL (ref >39.00)
LDL CALC: 49 mg/dL (ref 0–99)
NonHDL: 66.34
TRIGLYCERIDES: 85 mg/dL (ref 0.0–149.0)
VLDL: 17 mg/dL (ref 0.0–40.0)

## 2015-10-11 LAB — POCT GLYCOSYLATED HEMOGLOBIN (HGB A1C): Hemoglobin A1C: 7.2

## 2015-10-11 LAB — MICROALBUMIN / CREATININE URINE RATIO
CREATININE, U: 91.9 mg/dL
Microalb Creat Ratio: 0.8 mg/g (ref 0.0–30.0)

## 2015-10-11 MED ORDER — CANAGLIFLOZIN 100 MG PO TABS
100.0000 mg | ORAL_TABLET | Freq: Every day | ORAL | Status: DC
Start: 2015-10-11 — End: 2016-04-23

## 2015-10-11 MED ORDER — ACARBOSE 25 MG PO TABS: ORAL_TABLET | ORAL | Status: DC

## 2015-10-11 NOTE — Progress Notes (Signed)
Patient ID: [name] [name], female   DOB: July 27, 1955, 60 y.o.   MRN: [id number]           Reason for Appointment: Follow-up for Type 2 Diabetes   History of Present Illness:          Diagnosis: Type 2 diabetes mellitus, date of diagnosis: ?  2014   Past history:  The patient gives an inconsistent history. Her son thinks that she may have had diabetes diagnosed when she was in [place] about 2 years ago and was given unknown medications. When she moved to [facility] in 2014 she will not on any medications She was however found to have significant hyperglycemia with glucose 364 and A1c of 11.7 when she was admitted for her coronary artery disease in 04/2013. At that time she was started on premixed insulin twice a day Her level of control had not been adequate with persistently high A1c readings She did not tolerate Victoza well and was having decreased appetite, malaise and nausea and this was stopped subsequently  Recent history:        Oral hypoglycemic drugs:     Metformin ER 500 mg 3 tablets daily, Invokana 100 mg daily, Precose 25 mg before meals 3 times a day  She is on a 3 drug regimen and has A1c over 7% consistently Previously had been on insulin  Current blood sugar patterns and problems:  Her blood sugars are fairly good fasting and fairly consistent   She is able to check her glucose with an Accu-Chek monitor and has had no difficulty with getting her medications covered recently  Blood sugars are mostly higher after lunch based on what she is eating  More recently has not been eating much protein at lunchtime and maybe occasionally getting sweets  She does not like to eat yogurt and has not tried other sources of protein  Weight is slightly better    Hypoglycemia: None    Side effects from medications have been: None  Self-care: The diet that the patient has been following is: None, has vegetarian diet Usually having vegetables and roti with lunch and  dinner and sometimes having lentils.   Adding almonds for protein in the morning but not getting protein at other meals usually  Usually not eating fried food except for some snacks            Exercise:  walking almost daily either indoors or outdoors up to one hour         Dietician visit, none  Compliance with the medical regimen: Fair  Glucose monitoring:  done 2-3 times a day         Glucometer: Accu-Chek  Blood Glucose readings by meter download :  Mean values apply above for all meters except median for One Touch  PRE-MEAL Fasting Lunch Dinner Bedtime Overall  Glucose range:       Mean/median: 111     131   POST-MEAL PC Breakfast PC Lunch PC Dinner  Glucose range:  105-219  114-191   Mean/median:  152  144      Weight history:   Wt Readings from Last 3 Encounters:  10/11/15 130 lb 12.8 oz (59.33 kg)  07/13/15 131 lb 12.8 oz (59.784 kg)  04/12/15 133 lb (60.328 kg)    Glycemic control:   Lab Results  Component Value Date   HGBA1C 7.2 10/11/2015   HGBA1C 7.2* 07/10/2015   HGBA1C 7.5* 04/09/2015   Lab Results  Component Value Date  MICROALBUR <0.7 10/11/2015   LDLCALC 49 10/11/2015   CREATININE 0.70 10/11/2015    Office Visit on 10/11/2015  Component Date Value Ref Range Status  . Hemoglobin A1C 10/11/2015 7.2   Final  . Sodium 10/11/2015 137  135 - 145 mEq/L Final  . Potassium 10/11/2015 4.5  3.5 - 5.1 mEq/L Final  . Chloride 10/11/2015 103  96 - 112 mEq/L Final  . CO2 10/11/2015 29  19 - 32 mEq/L Final  . Glucose, Bld 10/11/2015 127* 70 - 99 mg/dL Final  . BUN 10/11/2015 13  6 - 23 mg/dL Final  . Creatinine, Ser 10/11/2015 0.70  0.40 - 1.20 mg/dL Final  . Total Bilirubin 10/11/2015 0.6  0.2 - 1.2 mg/dL Final  . Alkaline Phosphatase 10/11/2015 39  39 - 117 U/L Final  . AST 10/11/2015 12  0 - 37 U/L Final  . ALT 10/11/2015 12  0 - 35 U/L Final  . Total Protein 10/11/2015 7.2  6.0 - 8.3 g/dL Final  . Albumin 10/11/2015 4.1  3.5 - 5.2 g/dL Final  .  Calcium 10/11/2015 9.7  8.4 - 10.5 mg/dL Final  . GFR 10/11/2015 90.29  >60.00 mL/min Final  . Microalb, Ur 10/11/2015 <0.7  0.0 - 1.9 mg/dL Final  . Creatinine,U 10/11/2015 91.9   Final  . Microalb Creat Ratio 10/11/2015 0.8  0.0 - 30.0 mg/g Final  . Cholesterol 10/11/2015 107  0 - 200 mg/dL Final   ATP III Classification       Desirable:  < 200 mg/dL               Borderline High:  200 - 239 mg/dL          High:  > = 240 mg/dL  . Triglycerides 10/11/2015 85.0  0.0 - 149.0 mg/dL Final   Normal:  <150 mg/dLBorderline High:  150 - 199 mg/dL  . HDL 10/11/2015 40.50  >39.00 mg/dL Final  . VLDL 10/11/2015 17.0  0.0 - 40.0 mg/dL Final  . LDL Cholesterol 10/11/2015 49  0 - 99 mg/dL Final  . Total CHOL/HDL Ratio 10/11/2015 3   Final                  Men          Women1/2 Average Risk     3.4          3.3Average Risk          5.0          4.42X Average Risk          9.6          7.13X Average Risk          15.0          11.0                      . NonHDL 10/11/2015 66.34   Final   NOTE:  Non-HDL goal should be 30 mg/dL higher than patient's LDL goal (i.e. LDL goal of < 70 mg/dL, would have non-HDL goal of < 100 mg/dL)        Medication List       This list is accurate as of: 10/11/15  2:41 PM.  Always use your most recent med list.               acarbose 25 MG tablet  Commonly known as:  PRECOSE  take 1 tablet by mouth at breakfast, 2 tablets  at lunch and 1 tablet at dinner     aspirin EC 81 MG tablet  Take 1 tablet (81 mg total) by mouth daily.     atorvastatin 80 MG tablet  Commonly known as:  LIPITOR  Take 1 tablet (80 mg total) by mouth daily.     canagliflozin 100 MG Tabs tablet  Commonly known as:  INVOKANA  Take 1 tablet (100 mg total) by mouth daily.     clopidogrel 75 MG tablet  Commonly known as:  PLAVIX  take 1 tablet by mouth once daily     glucose blood test strip  Commonly known as:  ACCU-CHEK SMARTVIEW  USE AS DIRECTED. TEST 3 TIMES A DAY DX code E11.65      lisinopril 10 MG tablet  Commonly known as:  PRINIVIL,ZESTRIL  take 1 tablet by mouth once daily     metFORMIN 500 MG 24 hr tablet  Commonly known as:  GLUCOPHAGE-XR  take 3 tablets by mouth daily     metoprolol tartrate 25 MG tablet  Commonly known as:  LOPRESSOR  Take 0.5 tablets (12.5 mg total) by mouth 2 (two) times daily.     nitroGLYCERIN 0.4 MG SL tablet  Commonly known as:  NITROSTAT  Place 1 tablet (0.4 mg total) under the tongue every 5 (five) minutes as needed for chest pain.     ranitidine 75 MG tablet  Commonly known as:  ZANTAC  Take 75 mg by mouth 2 (two) times daily.        Allergies: No Known Allergies  Past Medical History  Diagnosis Date  . CAD (coronary artery disease)     a. 04/2013 Inf STEMI/PCI: LM nl, LAD min irregs, D1/2/3 nl, LCX 75m (2.5x18 Xience DES), OM1/2/3 nl, RCA nl, PDA nl, RPAV nl, RPL nl, EF 55%;  b. 04/2013 Echo: EF 55-60%, no rwma, mild MR, mildly dil LA.  Marland Kitchen Hypertension   . Hyperlipidemia   . Diabetes mellitus type 2, uncontrolled ([place])     a. 04/2013 HbA1c = 11.7.    Past Surgical History  Procedure Laterality Date  . Cesarean section    . Left heart cath  05-15-13  . Left heart catheterization with coronary angiogram N/A 05/15/2013    Procedure: LEFT HEART CATHETERIZATION WITH CORONARY ANGIOGRAM;  Surgeon: Wellington Hampshire, MD;  Location: [place] CATH LAB;  Service: Cardiovascular;  Laterality: N/A;  . Percutaneous stent intervention  05/15/2013    Procedure: PERCUTANEOUS STENT INTERVENTION;  Surgeon: Wellington Hampshire, MD;  Location: [place] CATH LAB;  Service: Cardiovascular;;    Family History  Problem Relation Age of Onset  . Heart disease Mother   . Diabetes Mother   . Hypertension Mother   . Heart disease Father   . Diabetes Father   . Hypertension Father   . Diabetes Brother     Social History:  reports that she has never smoked. She has never used smokeless tobacco. She reports that she does not drink alcohol or use illicit  drugs.    Review of Systems        Lipids: She has a prescription for Lipitor 80 mg from PCP.  Lipids are well controlled, has history of CAD       Lab Results  Component Value Date   CHOL 107 10/11/2015   HDL 40.50 10/11/2015   LDLCALC 49 10/11/2015   TRIG 85.0 10/11/2015   CHOLHDL 3 10/11/2015  The blood pressure has been controlled with lisinopril     LABS:  Office Visit on 10/11/2015  Component Date Value Ref Range Status  . Hemoglobin A1C 10/11/2015 7.2   Final  . Sodium 10/11/2015 137  135 - 145 mEq/L Final  . Potassium 10/11/2015 4.5  3.5 - 5.1 mEq/L Final  . Chloride 10/11/2015 103  96 - 112 mEq/L Final  . CO2 10/11/2015 29  19 - 32 mEq/L Final  . Glucose, Bld 10/11/2015 127* 70 - 99 mg/dL Final  . BUN 10/11/2015 13  6 - 23 mg/dL Final  . Creatinine, Ser 10/11/2015 0.70  0.40 - 1.20 mg/dL Final  . Total Bilirubin 10/11/2015 0.6  0.2 - 1.2 mg/dL Final  . Alkaline Phosphatase 10/11/2015 39  39 - 117 U/L Final  . AST 10/11/2015 12  0 - 37 U/L Final  . ALT 10/11/2015 12  0 - 35 U/L Final  . Total Protein 10/11/2015 7.2  6.0 - 8.3 g/dL Final  . Albumin 10/11/2015 4.1  3.5 - 5.2 g/dL Final  . Calcium 10/11/2015 9.7  8.4 - 10.5 mg/dL Final  . GFR 10/11/2015 90.29  >60.00 mL/min Final  . Microalb, Ur 10/11/2015 <0.7  0.0 - 1.9 mg/dL Final  . Creatinine,U 10/11/2015 91.9   Final  . Microalb Creat Ratio 10/11/2015 0.8  0.0 - 30.0 mg/g Final  . Cholesterol 10/11/2015 107  0 - 200 mg/dL Final   ATP III Classification       Desirable:  < 200 mg/dL               Borderline High:  200 - 239 mg/dL          High:  > = 240 mg/dL  . Triglycerides 10/11/2015 85.0  0.0 - 149.0 mg/dL Final   Normal:  <150 mg/dLBorderline High:  150 - 199 mg/dL  . HDL 10/11/2015 40.50  >39.00 mg/dL Final  . VLDL 10/11/2015 17.0  0.0 - 40.0 mg/dL Final  . LDL Cholesterol 10/11/2015 49  0 - 99 mg/dL Final  . Total CHOL/HDL Ratio 10/11/2015 3   Final                  Men           Women1/2 Average Risk     3.4          3.3Average Risk          5.0          4.42X Average Risk          9.6          7.13X Average Risk          15.0          11.0                      . NonHDL 10/11/2015 66.34   Final   NOTE:  Non-HDL goal should be 30 mg/dL higher than patient's LDL goal (i.e. LDL goal of < 70 mg/dL, would have non-HDL goal of < 100 mg/dL)    Physical Examination:  BP 108/72 mmHg  Pulse 71  Temp(Src) 98.5 F (36.9 C)  Resp 14  Ht 4\' 11"  (1.499 m)  Wt 130 lb 12.8 oz (59.33 kg)  BMI 26.40 kg/m2  SpO2 96%    ASSESSMENT:  Diabetes type 2, uncontrolled with obesity  She has had fairly good blood sugar control although A1c is still over 7% Again  her blood sugars are more higher after lunch when she has more carbohydrate and less protein and this was discussed She will try to use [place] yogurt, lentils, tofu/ other sources of protein  PLAN:   Increase Precose to 50 mg at lunch  No change in the medication  Follow-up in 4 months  Needs regular eye exams     Patient Instructions  Acarbose 2 tabs at lunch  More Protein at lunch  Eye Dr: Dr Bing Plume, Dr Clarisse Gouge 10/11/2015, 2:41 PM   Note: This office note was prepared with Dragon voice recognition system technology. Any transcriptional errors that result from this process are unintentional.

## 2015-10-11 NOTE — Patient Instructions (Signed)
Acarbose 2 tabs at lunch  More Protein at lunch  Eye Dr: Dr Bing Plume, Dr Katy Fitch

## 2015-11-14 ENCOUNTER — Other Ambulatory Visit: Payer: Self-pay | Admitting: Cardiology

## 2015-11-28 ENCOUNTER — Other Ambulatory Visit: Payer: Self-pay | Admitting: Endocrinology

## 2015-12-14 ENCOUNTER — Other Ambulatory Visit: Payer: Self-pay | Admitting: Endocrinology

## 2016-01-15 ENCOUNTER — Other Ambulatory Visit: Payer: Self-pay | Admitting: Physician Assistant

## 2016-01-15 DIAGNOSIS — E785 Hyperlipidemia, unspecified: Secondary | ICD-10-CM

## 2016-01-30 ENCOUNTER — Other Ambulatory Visit: Payer: Self-pay | Admitting: Physician Assistant

## 2016-01-30 DIAGNOSIS — I10 Essential (primary) hypertension: Secondary | ICD-10-CM

## 2016-02-05 ENCOUNTER — Other Ambulatory Visit (INDEPENDENT_AMBULATORY_CARE_PROVIDER_SITE_OTHER): Payer: Medicaid Other

## 2016-02-05 DIAGNOSIS — E119 Type 2 diabetes mellitus without complications: Secondary | ICD-10-CM

## 2016-02-05 LAB — BASIC METABOLIC PANEL
BUN: 14 mg/dL (ref 6–23)
CHLORIDE: 105 meq/L (ref 96–112)
CO2: 26 mEq/L (ref 19–32)
Calcium: 9.3 mg/dL (ref 8.4–10.5)
Creatinine, Ser: 0.81 mg/dL (ref 0.40–1.20)
GFR: 76.21 mL/min (ref >60.00)
GLUCOSE: 137 mg/dL — AB (ref 70–99)
POTASSIUM: 4.8 meq/L (ref 3.5–5.1)
SODIUM: 137 meq/L (ref 135–145)

## 2016-02-05 LAB — HEMOGLOBIN A1C: Hgb A1c MFr Bld: 7.4 % — ABNORMAL HIGH (ref 4.6–6.5)

## 2016-02-08 ENCOUNTER — Ambulatory Visit (INDEPENDENT_AMBULATORY_CARE_PROVIDER_SITE_OTHER): Payer: Medicaid Other | Admitting: Endocrinology

## 2016-02-08 VITALS — BP 112/70 | HR 72 | Ht 59.0 in | Wt 131.0 lb

## 2016-02-08 DIAGNOSIS — E1165 Type 2 diabetes mellitus with hyperglycemia: Secondary | ICD-10-CM

## 2016-02-08 NOTE — Progress Notes (Signed)
Patient ID: [name] [name], female   DOB: August 19, 1955, 60 y.o.   MRN: [id number]           Reason for Appointment: Follow-up for Type 2 Diabetes   History of Present Illness:          Diagnosis: Type 2 diabetes mellitus, date of diagnosis: ?  2014   Past history:  The patient gives an inconsistent history. Her son thinks that she may have had diabetes diagnosed when she was in [place] about 2 years ago and was given unknown medications. When she moved to [facility] in 2014 she will not on any medications She was however found to have significant hyperglycemia with glucose 364 and A1c of 11.7 when she was admitted for her coronary artery disease in 04/2013. At that time she was started on premixed insulin twice a day Her level of control had not been adequate with persistently high A1c readings She did not tolerate Victoza well and was having decreased appetite, malaise and nausea and this was stopped subsequently  Recent history:        Oral hypoglycemic drugs:     Metformin ER 500 mg 3 tablets daily, Invokana 100 mg daily, Precose 25 mg before breakfast and supper and 50 mg before lunch  She is on a 3 drug regimen and has A1c over 7% consistently Previously had been on insulin  Current blood sugar patterns and problems:  Her blood sugars are fairly good fasting and fairly consistent   Blood sugars are sporadically higher after her lunch but not this consistently as before and are averaging only about 139 recently  She has tolerated the extra 25 mg Precose at lunchtime and is compliant with this  She has been advised to add protein to all her meals  She does do very well with adding to walk regularly during the day  Weight is stable     Hypoglycemia: None    Side effects from medications have been: None  Self-care: The diet that the patient has been following is: None, has vegetarian diet Usually having vegetables and roti with lunch and dinner and sometimes having  lentils.   Adding almonds for protein in the morning but not getting protein at other meals usually  Usually not eating fried food except for some snacks            Exercise:  walking almost daily either indoors or outdoors up to one hour         Dietician visit, none  Compliance with the medical regimen: Fair  Glucose monitoring:  done 2-3 times a day         Glucometer: Accu-Chek  Blood Glucose readings by meter download :  Mean values apply above for all meters except median for One Touch  PRE-MEAL Fasting After Lunch Dinner Bedtime Overall  Glucose range: 107-159  92-192   98-166    Mean/median: 121  139   127  131    Weight history:   Wt Readings from Last 3 Encounters:  02/08/16 131 lb (59.4 kg)  10/11/15 130 lb 12.8 oz (59.3 kg)  07/13/15 131 lb 12.8 oz (59.8 kg)    Glycemic control:   Lab Results  Component Value Date   HGBA1C 7.4 (H) 02/05/2016   HGBA1C 7.2 10/11/2015   HGBA1C 7.2 (H) 07/10/2015   Lab Results  Component Value Date   MICROALBUR <0.7 10/11/2015   LDLCALC 49 10/11/2015   CREATININE 0.81 02/05/2016    Lab on  02/05/2016  Component Date Value Ref Range Status  . Sodium 02/05/2016 137  135 - 145 mEq/L Final  . Potassium 02/05/2016 4.8  3.5 - 5.1 mEq/L Final  . Chloride 02/05/2016 105  96 - 112 mEq/L Final  . CO2 02/05/2016 26  19 - 32 mEq/L Final  . Glucose, Bld 02/05/2016 137* 70 - 99 mg/dL Final  . BUN 02/05/2016 14  6 - 23 mg/dL Final  . Creatinine, Ser 02/05/2016 0.81  0.40 - 1.20 mg/dL Final  . Calcium 02/05/2016 9.3  8.4 - 10.5 mg/dL Final  . GFR 02/05/2016 76.21  >60.00 mL/min Final  . Hgb A1c MFr Bld 02/05/2016 7.4* 4.6 - 6.5 % Final        Medication List       Accurate as of 02/08/16 12:43 PM. Always use your most recent med list.          acarbose 25 MG tablet Commonly known as:  PRECOSE take 1 tablet by mouth at breakfast, 2 tablets at lunch and 1 tablet at dinner   ACCU-CHEK SMARTVIEW test strip Generic drug:   glucose blood TEST three times a day as directed   aspirin EC 81 MG tablet Take 1 tablet (81 mg total) by mouth daily.   atorvastatin 80 MG tablet Commonly known as:  LIPITOR take 1 tablet by mouth once daily   canagliflozin 100 MG Tabs tablet Commonly known as:  INVOKANA Take 1 tablet (100 mg total) by mouth daily.   clopidogrel 75 MG tablet Commonly known as:  PLAVIX take 1 tablet by mouth once daily   lisinopril 10 MG tablet Commonly known as:  PRINIVIL,ZESTRIL Take 1 tablet (10 mg total) by mouth daily. Please call and schedule an appointment   metFORMIN 500 MG 24 hr tablet Commonly known as:  GLUCOPHAGE-XR take 3 tablets by mouth daily   metoprolol tartrate 25 MG tablet Commonly known as:  LOPRESSOR take 1/2 tablets by mouth twice a day   nitroGLYCERIN 0.4 MG SL tablet Commonly known as:  NITROSTAT Place 1 tablet (0.4 mg total) under the tongue every 5 (five) minutes as needed for chest pain.   ranitidine 75 MG tablet Commonly known as:  ZANTAC Take 75 mg by mouth 2 (two) times daily.       Allergies: No Known Allergies  Past Medical History:  Diagnosis Date  . CAD (coronary artery disease)    a. 04/2013 Inf STEMI/PCI: LM nl, LAD min irregs, D1/2/3 nl, LCX 78m (2.5x18 Xience DES), OM1/2/3 nl, RCA nl, PDA nl, RPAV nl, RPL nl, EF 55%;  b. 04/2013 Echo: EF 55-60%, no rwma, mild MR, mildly dil LA.  . Diabetes mellitus type 2, uncontrolled ([place])    a. 04/2013 HbA1c = 11.7.  . Hyperlipidemia   . Hypertension     Past Surgical History:  Procedure Laterality Date  . CESAREAN SECTION    . LEFT HEART CATH  05-15-13  . LEFT HEART CATHETERIZATION WITH CORONARY ANGIOGRAM N/A 05/15/2013   Procedure: LEFT HEART CATHETERIZATION WITH CORONARY ANGIOGRAM;  Surgeon: Wellington Hampshire, MD;  Location: [place] CATH LAB;  Service: Cardiovascular;  Laterality: N/A;  . PERCUTANEOUS STENT INTERVENTION  05/15/2013   Procedure: PERCUTANEOUS STENT INTERVENTION;  Surgeon: Wellington Hampshire, MD;  Location: [place] CATH LAB;  Service: Cardiovascular;;    Family History  Problem Relation Age of Onset  . Heart disease Mother   . Diabetes Mother   . Hypertension Mother   . Heart disease Father   .  Diabetes Father   . Hypertension Father   . Diabetes Brother     Social History:  reports that she has never smoked. She has never used smokeless tobacco. She reports that she does not drink alcohol or use drugs.    Review of Systems        Lipids: She Has been on Lipitor 80 mg from PCP.  Lipids are well controlled, has history of CAD       Lab Results  Component Value Date   CHOL 107 10/11/2015   HDL 40.50 10/11/2015   LDLCALC 49 10/11/2015   TRIG 85.0 10/11/2015   CHOLHDL 3 10/11/2015                   The blood pressure has been controlled with lisinopril     LABS:  Lab on 02/05/2016  Component Date Value Ref Range Status  . Sodium 02/05/2016 137  135 - 145 mEq/L Final  . Potassium 02/05/2016 4.8  3.5 - 5.1 mEq/L Final  . Chloride 02/05/2016 105  96 - 112 mEq/L Final  . CO2 02/05/2016 26  19 - 32 mEq/L Final  . Glucose, Bld 02/05/2016 137* 70 - 99 mg/dL Final  . BUN 02/05/2016 14  6 - 23 mg/dL Final  . Creatinine, Ser 02/05/2016 0.81  0.40 - 1.20 mg/dL Final  . Calcium 02/05/2016 9.3  8.4 - 10.5 mg/dL Final  . GFR 02/05/2016 76.21  >60.00 mL/min Final  . Hgb A1c MFr Bld 02/05/2016 7.4* 4.6 - 6.5 % Final    Physical Examination:  BP 112/70   Pulse 72   Ht 4\' 11"  (1.499 m)   Wt 131 lb (59.4 kg)   SpO2 99%   BMI 26.46 kg/m   Diabetic Foot Exam - Simple   Simple Foot Form Diabetic Foot exam was performed with the following findings:  Yes   Visual Inspection No deformities, no ulcerations, no other skin breakdown bilaterally:  Yes Sensation Testing Intact to touch and monofilament testing bilaterally:  Yes Pulse Check Posterior Tibialis and Dorsalis pulse intact bilaterally:  Yes Comments      ASSESSMENT:  Diabetes type 2, uncontrolled  with obesity  See history of present illness for detailed discussion of current diabetes management, blood sugar patterns and problems identified  She has had fairly good blood sugar control with better readings after meals with a 50 mg dose Precose before her main meal at lunch She has been very compliant with her exercise regimen Also checking blood sugars at least twice a day Fasting readings are consistent also  PLAN:   No change in the medication regimen  Follow-up in 4 months  Needs regular eye exams  May check less often fasting   Patient Instructions    Check blood sugars on waking up  3x per week  Also check blood sugars about 2 hours after a meal and do this after different meals by rotation  Recommended blood sugar levels on waking up is 90-130 and about 2 hours after meal is 130-160  Please bring your blood sugar monitor to each visit, thank you  [place] yogurt, lentils, tofu/soy nuggets for protein    Tonya Wantz 02/08/2016, 12:43 PM   Note: This office note was prepared with Estate agent. Any transcriptional errors that result from this process are unintentional.

## 2016-02-08 NOTE — Patient Instructions (Addendum)
  Check blood sugars on waking up  3x per week  Also check blood sugars about 2 hours after a meal and do this after different meals by rotation  Recommended blood sugar levels on waking up is 90-130 and about 2 hours after meal is 130-160  Please bring your blood sugar monitor to each visit, thank you  [place] yogurt, lentils, tofu/soy nuggets for protein

## 2016-02-14 ENCOUNTER — Other Ambulatory Visit: Payer: Self-pay | Admitting: Endocrinology

## 2016-02-24 ENCOUNTER — Other Ambulatory Visit: Payer: Self-pay | Admitting: Physician Assistant

## 2016-02-24 ENCOUNTER — Other Ambulatory Visit: Payer: Self-pay | Admitting: Endocrinology

## 2016-02-24 DIAGNOSIS — E785 Hyperlipidemia, unspecified: Secondary | ICD-10-CM

## 2016-03-19 ENCOUNTER — Other Ambulatory Visit: Payer: Self-pay | Admitting: Physician Assistant

## 2016-03-19 DIAGNOSIS — E785 Hyperlipidemia, unspecified: Secondary | ICD-10-CM

## 2016-03-31 ENCOUNTER — Other Ambulatory Visit: Payer: Self-pay | Admitting: Physician Assistant

## 2016-03-31 ENCOUNTER — Other Ambulatory Visit: Payer: Self-pay | Admitting: Cardiology

## 2016-03-31 DIAGNOSIS — I10 Essential (primary) hypertension: Secondary | ICD-10-CM

## 2016-03-31 DIAGNOSIS — E785 Hyperlipidemia, unspecified: Secondary | ICD-10-CM

## 2016-03-31 MED ORDER — METOPROLOL TARTRATE 25 MG PO TABS: ORAL_TABLET | ORAL | 0 refills | Status: DC

## 2016-03-31 MED ORDER — ATORVASTATIN CALCIUM 80 MG PO TABS: ORAL_TABLET | ORAL | 0 refills | Status: DC

## 2016-04-10 ENCOUNTER — Other Ambulatory Visit: Payer: Self-pay | Admitting: Cardiology

## 2016-04-18 NOTE — Progress Notes (Signed)
Cardiology Office Note    Date:  04/21/2016   ID:  [name] [name], DOB [birth year], MRN [id number]  PCP:  Lorayne Marek, MD  Cardiologist:  Dr. Loralie Champagne  --> Dr. Marlou Porch  CC: follow up  History of Present Illness:  [name] [name] is a 60 y.o. female with a history of diabetes, HTN, and CAD s/p DES to LCx who presents to clinic for follow up.   She is [place] and speaks little [place]. Patient was admitted in 11/14 with inferior STEMI. She had DES to mLCx. EF was preserved. Last seen by Dr. Aundra Dubin 06/2014. At that time, he decided to keep her on long-term dual antiplatelet therapy. Effient was stopped and she was placed on Plavix in addition to aspirin.  Last seen by Richardson Dopp PA-C in 01/2015 and doing well.  Here with son and interpreter.  The patient denies chest pain, shortness of breath, syncope, orthopnea, PND or significant pedal edema.   Today she presents to clinic for follow up. Son here for translator. She is going to be in [place] for 2 months on 12/20 for a cousins wedding. No chest pain except some episode when she had a pain in her shoulders and neck. This occurs usually in the evening times and at sleep. Not related to exertion. She thinks it is related to a cold and it is not similar to when she had her MI. No diaphoresis or SOB. No n/v. No LE edema, orthopnea or PND. No dizziness or syncope. No pain in legs with walking. No snoring at night or excessive daytime sleepiness.     Past Medical History:  Diagnosis Date  . CAD (coronary artery disease)    a. 04/2013 Inf STEMI/PCI: LM nl, LAD min irregs, D1/2/3 nl, LCX 30m (2.5x18 Xience DES), OM1/2/3 nl, RCA nl, PDA nl, RPAV nl, RPL nl, EF 55%;  b. 04/2013 Echo: EF 55-60%, no rwma, mild MR, mildly dil LA.  . Diabetes mellitus type 2, uncontrolled ([place])    a. 04/2013 HbA1c = 11.7.  . Hyperlipidemia   . Hypertension     Past Surgical History:  Procedure Laterality Date  . CESAREAN SECTION    . LEFT HEART CATH   05-15-13  . LEFT HEART CATHETERIZATION WITH CORONARY ANGIOGRAM N/A 05/15/2013   Procedure: LEFT HEART CATHETERIZATION WITH CORONARY ANGIOGRAM;  Surgeon: Wellington Hampshire, MD;  Location: [facility] CATH [facility];  Service: Cardiovascular;  Laterality: N/A;  . PERCUTANEOUS STENT INTERVENTION  05/15/2013   Procedure: PERCUTANEOUS STENT INTERVENTION;  Surgeon: Wellington Hampshire, MD;  Location: [facility] CATH [facility];  Service: Cardiovascular;;    Current Medications: Outpatient Medications Prior to Visit  Medication Sig Dispense Refill  . acarbose (PRECOSE) 25 MG tablet take 1 tablet by mouth AT BREAKFAST then take 2 tablets by mouth AT LUNCH and 1 tablet by mouth AT DINNER 120 tablet 3  . ACCU-CHEK SMARTVIEW test strip TEST three times a day as directed 100 each 3  . aspirin EC 81 MG tablet Take 1 tablet (81 mg total) by mouth daily.    . canagliflozin (INVOKANA) 100 MG TABS tablet Take 1 tablet (100 mg total) by mouth daily. 30 tablet 3  . metFORMIN (GLUCOPHAGE-XR) 500 MG 24 hr tablet take 3 tablets by mouth once daily 90 tablet 2  . ranitidine (ZANTAC) 75 MG tablet Take 75 mg by mouth 2 (two) times daily.    Marland Kitchen atorvastatin (LIPITOR) 80 MG tablet take 1 tablet by mouth once daily *MUST MAKE APPOINTMENT WITH DOCTOR  FOR MORE REFILLS* 90 tablet 0  . clopidogrel (PLAVIX) 75 MG tablet Take 1 tablet (75 mg total) by mouth daily. MUST HAVE APPOINTMENT FOR FURTHER REFILLS 15 tablet 0  . lisinopril (PRINIVIL,ZESTRIL) 10 MG tablet Take 1 tablet (10 mg total) by mouth daily. MUST HAVE APPOINTMENT FOR FURTHER REFILLS 15 tablet 0  . metoprolol tartrate (LOPRESSOR) 25 MG tablet take 1/2 tablets by mouth twice a day. MUST HAVE APPOINTMENT FOR FURTHER REFILLS 15 tablet 0  . nitroGLYCERIN (NITROSTAT) 0.4 MG SL tablet Place 1 tablet (0.4 mg total) under the tongue every 5 (five) minutes as needed for chest pain. 25 tablet 3   No facility-administered medications prior to visit.      Allergies:   Patient has no known allergies.    Social History   Social History  . Marital status: Widowed    Spouse name: N/A  . Number of children: N/A  . Years of education: N/A   Social History Main Topics  . Smoking status: Never Smoker  . Smokeless tobacco: Never Used  . Alcohol use No  . Drug use: No  . Sexual activity: Not on file   Other Topics Concern  . Not on file   Social History Narrative  . No narrative on file     Family History:  The patient's family history includes Diabetes in her brother, father, and mother; Heart disease in her father and mother; Hypertension in her father and mother.     ROS:   Please see the history of present illness.    ROS All other systems reviewed and are negative.   PHYSICAL EXAM:   VS:  BP 136/72   Pulse 75   Ht 4\' 11"  (1.499 m)   Wt 134 lb (60.8 kg)   SpO2 98%   BMI 27.06 kg/m    GEN: Well nourished, well developed, in no acute distress  HEENT: normal  Neck: no JVD, carotid bruits, or masses Cardiac: RRR; no murmurs, rubs, or gallops,no edema  Respiratory:  clear to auscultation bilaterally, normal work of breathing GI: soft, nontender, nondistended, + BS MS: no deformity or atrophy  Skin: warm and dry, no rash Neuro:  Alert and Oriented x 3, Strength and sensation are intact Psych: euthymic mood, full affect  Wt Readings from Last 3 Encounters:  04/21/16 134 lb (60.8 kg)  02/08/16 131 lb (59.4 kg)  10/11/15 130 lb 12.8 oz (59.3 kg)      Studies/Labs Reviewed:   EKG:  EKG is ordered today.  The ekg ordered today demonstrates NSR, low voltage QRS HR 68  Recent Labs: 10/11/2015: ALT 12 04/21/2016: BUN 14; Creatinine, Ser 0.72; Potassium 4.8; Sodium 140   Lipid Panel    Component Value Date/Time   CHOL 107 10/11/2015 1049   TRIG 85.0 10/11/2015 1049   HDL 40.50 10/11/2015 1049   CHOLHDL 3 10/11/2015 1049   VLDL 17.0 10/11/2015 1049   LDLCALC 49 10/11/2015 1049    Additional studies/ records that were reviewed today include:  Echo  05/15/13 Mild LVH, EF 55-60%, normal wall motion, mild MR, mild LAE, redundant atrial septum but no obvious PFO/ASD  LHC 05/15/13 Final Conclusions:  1. Severe one-vessel coronary artery disease with 99% hazy mid left circumflex stenosis which is the culprit for inferior ST elevation MI. 2. Normal LV systolic function and mildly elevated left ventricular end-diastolic pressure. 3. Successful angioplasty and drug-eluting stent placement to the left circumflex artery.    ASSESSMENT & PLAN:   Coronary  artery disease involving native coronary artery of native heart without angina pectoris: No angina.  Continue ASA, Plavix, statin, ACE inhibitor, beta-blocker.    Essential hypertension:  Controlled.   Hyperlipidemia:  Recent LDL 49.  Continue statin.   Diabetes mellitus type 2:  Recent A1c 7.4. Continue follow up with Dr. Dwyane Dee   Medication Adjustments/Labs and Tests Ordered: Current medicines are reviewed at length with the patient today.  Concerns regarding medicines are outlined above.  Medication changes, Labs and Tests ordered today are listed in the Patient Instructions below. Patient Instructions  Medication Instructions:  Your physician recommends that you continue on your current medications as directed. Please refer to the Current Medication list given to you today.  Labwork: NONE  Testing/Procedures: NONE  Follow-Up: Your physician wants you to follow-up in: 6 months with Dr. Marlou Porch. You will receive a reminder letter in the mail two months in advance. If you don't receive a letter, please call our office to schedule the follow-up appointment.   If you need a refill on your cardiac medications before your next appointment, please call your pharmacy.      Signed, Angelena Form, PA-C  04/21/2016 11:48 AM    [facility] [address], [place], [place]  [address] Phone: [phone]; Fax: [phone]

## 2016-04-21 ENCOUNTER — Other Ambulatory Visit (INDEPENDENT_AMBULATORY_CARE_PROVIDER_SITE_OTHER): Payer: Medicaid Other

## 2016-04-21 ENCOUNTER — Ambulatory Visit (INDEPENDENT_AMBULATORY_CARE_PROVIDER_SITE_OTHER): Payer: Medicaid Other | Admitting: Physician Assistant

## 2016-04-21 VITALS — BP 136/72 | HR 75 | Ht 59.0 in | Wt 134.0 lb

## 2016-04-21 DIAGNOSIS — E118 Type 2 diabetes mellitus with unspecified complications: Secondary | ICD-10-CM | POA: Diagnosis not present

## 2016-04-21 DIAGNOSIS — I2510 Atherosclerotic heart disease of native coronary artery without angina pectoris: Secondary | ICD-10-CM | POA: Diagnosis not present

## 2016-04-21 DIAGNOSIS — E1165 Type 2 diabetes mellitus with hyperglycemia: Secondary | ICD-10-CM | POA: Diagnosis not present

## 2016-04-21 DIAGNOSIS — E785 Hyperlipidemia, unspecified: Secondary | ICD-10-CM

## 2016-04-21 DIAGNOSIS — I10 Essential (primary) hypertension: Secondary | ICD-10-CM | POA: Diagnosis not present

## 2016-04-21 LAB — BASIC METABOLIC PANEL
BUN: 14 mg/dL (ref 6–23)
CALCIUM: 9.7 mg/dL (ref 8.4–10.5)
CO2: 27 mEq/L (ref 19–32)
CREATININE: 0.72 mg/dL (ref 0.40–1.20)
Chloride: 107 mEq/L (ref 96–112)
GFR: 87.25 mL/min (ref >60.00)
Glucose, Bld: 126 mg/dL — ABNORMAL HIGH (ref 70–99)
Potassium: 4.8 mEq/L (ref 3.5–5.1)
SODIUM: 140 meq/L (ref 135–145)

## 2016-04-21 LAB — HEMOGLOBIN A1C: Hgb A1c MFr Bld: 7.4 % — ABNORMAL HIGH (ref 4.6–6.5)

## 2016-04-21 MED ORDER — NITROGLYCERIN 0.4 MG SL SUBL: 0.4000 mg | SUBLINGUAL_TABLET | SUBLINGUAL | 3 refills | Status: DC | PRN

## 2016-04-21 MED ORDER — METOPROLOL TARTRATE 25 MG PO TABS: ORAL_TABLET | ORAL | 3 refills | Status: DC

## 2016-04-21 MED ORDER — CLOPIDOGREL BISULFATE 75 MG PO TABS: 75.0000 mg | ORAL_TABLET | Freq: Every day | ORAL | 3 refills | Status: DC

## 2016-04-21 MED ORDER — ATORVASTATIN CALCIUM 80 MG PO TABS: 80.0000 mg | ORAL_TABLET | Freq: Every day | ORAL | 3 refills | Status: DC

## 2016-04-21 MED ORDER — LISINOPRIL 10 MG PO TABS: 10.0000 mg | ORAL_TABLET | Freq: Every day | ORAL | 3 refills | Status: DC

## 2016-04-21 NOTE — Patient Instructions (Signed)
Medication Instructions:  Your physician recommends that you continue on your current medications as directed. Please refer to the Current Medication list given to you today.  Labwork: NONE  Testing/Procedures: NONE  Follow-Up: Your physician wants you to follow-up in: 6 months with Dr. Skains. You will receive a reminder letter in the mail two months in advance. If you don't receive a letter, please call our office to schedule the follow-up appointment.   If you need a refill on your cardiac medications before your next appointment, please call your pharmacy.    

## 2016-04-23 ENCOUNTER — Other Ambulatory Visit: Payer: Self-pay

## 2016-04-23 ENCOUNTER — Ambulatory Visit (INDEPENDENT_AMBULATORY_CARE_PROVIDER_SITE_OTHER): Payer: Medicaid Other | Admitting: Endocrinology

## 2016-04-23 VITALS — BP 138/80 | HR 74 | Ht 59.0 in | Wt 134.0 lb

## 2016-04-23 DIAGNOSIS — E1165 Type 2 diabetes mellitus with hyperglycemia: Secondary | ICD-10-CM | POA: Diagnosis not present

## 2016-04-23 DIAGNOSIS — Z23 Encounter for immunization: Secondary | ICD-10-CM

## 2016-04-23 MED ORDER — FLUCONAZOLE 150 MG PO TABS: 150.0000 mg | ORAL_TABLET | Freq: Every day | ORAL | 1 refills | Status: DC

## 2016-04-23 MED ORDER — GLUCOSE BLOOD VI STRP: ORAL_STRIP | 1 refills | Status: DC

## 2016-04-23 MED ORDER — METFORMIN HCL ER 500 MG PO TB24: 1500.0000 mg | ORAL_TABLET | Freq: Every day | ORAL | 1 refills | Status: DC

## 2016-04-23 MED ORDER — CANAGLIFLOZIN 100 MG PO TABS: 100.0000 mg | ORAL_TABLET | Freq: Every day | ORAL | 1 refills | Status: DC

## 2016-04-23 NOTE — Patient Instructions (Signed)
Acarbose 1/2 at Breakfast and 1 at lunch  No Cereal, have protein in am

## 2016-04-23 NOTE — Progress Notes (Signed)
Patient ID: [name] [name], female   DOB: 30-Nov-1955, 60 y.o.   MRN: [id number]           Reason for Appointment: Follow-up for Type 2 Diabetes   History of Present Illness:          Diagnosis: Type 2 diabetes mellitus, date of diagnosis: ?  2014   Past history:  The patient gives an inconsistent history. Her son thinks that she may have had diabetes diagnosed when she was in [place] about 2 years ago and was given unknown medications. When she moved to [facility] in 2014 she will not on any medications She was however found to have significant hyperglycemia with glucose 364 and A1c of 11.7 when she was admitted for her coronary artery disease in 04/2013. At that time she was started on premixed insulin twice a day Her level of control had not been adequate with persistently high A1c readings She did not tolerate Victoza well and was having decreased appetite, malaise and nausea and this was stopped subsequently  Recent history:        Oral hypoglycemic drugs:     Metformin ER 500 mg 3 tablets daily, Invokana 100 mg daily, Precose 25 mg before breakfast and supper and 50 mg before lunch  She is on a 3 drug regimen and has A1c over 7% consistently, again 7.4 now Previously had been on insulin  Current blood sugar patterns and problems:  Her blood sugars are not looking any higher than before but her A1c is again higher than expected.  Blood sugars are sporadically higher after her lunch but  Only occasionally and may be related to her having some sweets with holiday celebrations.   blood sugars are generally better after suppertime after lunch   She now says that she is getting some gaseousness and some bloating throughout the day, had not complained about this with taking Precose.   she is also complaining of some itching and burning sensation in her genital area and using some OTC cream but not antifungal.  She has been advised to add protein to all her meals but is  generally eating a cereal at breakfast or other carbohydrate  She does do very well with adding to walk regularly during the day  Weight is  Relatively higher    Hypoglycemia: None    Side effects from medications have been: None  Self-care: The diet that the patient has been following is: None, has vegetarian diet Usually having vegetables and roti with lunch and dinner and sometimes having lentils.   Adding almonds for protein in the morning but not getting protein at other meals usually   Usually not eating fried food except for some snacks            Exercise:  walking almost daily either indoors or outdoors up to one hour         Dietician visit, none  Compliance with the medical regimen: Fair  Glucose monitoring:  done 2-3 times a day         Glucometer: Accu-Chek  Blood Glucose readings by meter download :  Mean values apply above for all meters except median for One Touch  PRE-MEAL Fasting Lunch Dinner Bedtime Overall  Glucose range:  84- 141    94- 174   Mean/median:     140 140   POST-MEAL PC Breakfast PC Lunch PC Dinner  Glucose range:   104-276   Mean/median:   148  Weight history:   Wt Readings from Last 3 Encounters:  04/23/16 134 lb (60.8 kg)  04/21/16 134 lb (60.8 kg)  02/08/16 131 lb (59.4 kg)    Glycemic control:   Lab Results  Component Value Date   HGBA1C 7.4 (H) 04/21/2016   HGBA1C 7.4 (H) 02/05/2016   HGBA1C 7.2 10/11/2015   Lab Results  Component Value Date   MICROALBUR <0.7 10/11/2015   LDLCALC 49 10/11/2015   CREATININE 0.72 04/21/2016    Lab on 04/21/2016  Component Date Value Ref Range Status  . Hgb A1c MFr Bld 04/21/2016 7.4* 4.6 - 6.5 % Final  . Sodium 04/21/2016 140  135 - 145 mEq/L Final  . Potassium 04/21/2016 4.8  3.5 - 5.1 mEq/L Final  . Chloride 04/21/2016 107  96 - 112 mEq/L Final  . CO2 04/21/2016 27  19 - 32 mEq/L Final  . Glucose, Bld 04/21/2016 126* 70 - 99 mg/dL Final  . BUN 04/21/2016 14  6 - 23 mg/dL  Final  . Creatinine, Ser 04/21/2016 0.72  0.40 - 1.20 mg/dL Final  . Calcium 04/21/2016 9.7  8.4 - 10.5 mg/dL Final  . GFR 04/21/2016 87.25  >60.00 mL/min Final        Medication List       Accurate as of 04/23/16  8:52 PM. Always use your most recent med list.          acarbose 25 MG tablet Commonly known as:  PRECOSE take 1 tablet by mouth AT BREAKFAST then take 2 tablets by mouth AT LUNCH and 1 tablet by mouth AT DINNER   aspirin EC 81 MG tablet Take 1 tablet (81 mg total) by mouth daily.   atorvastatin 80 MG tablet Commonly known as:  LIPITOR Take 1 tablet (80 mg total) by mouth daily at 6 PM.   canagliflozin 100 MG Tabs tablet Commonly known as:  INVOKANA Take 1 tablet (100 mg total) by mouth daily.   clopidogrel 75 MG tablet Commonly known as:  PLAVIX Take 1 tablet (75 mg total) by mouth daily.   fluconazole 150 MG tablet Commonly known as:  DIFLUCAN Take 1 tablet (150 mg total) by mouth daily.   glucose blood test strip Commonly known as:  ACCU-CHEK SMARTVIEW TEST three times a day as directed Dx code-E11.65   lisinopril 10 MG tablet Commonly known as:  PRINIVIL,ZESTRIL Take 1 tablet (10 mg total) by mouth daily.   metFORMIN 500 MG 24 hr tablet Commonly known as:  GLUCOPHAGE-XR Take 3 tablets (1,500 mg total) by mouth daily.   metoprolol tartrate 25 MG tablet Commonly known as:  LOPRESSOR take 1/2 tablets by mouth twice a day.   nitroGLYCERIN 0.4 MG SL tablet Commonly known as:  NITROSTAT Place 1 tablet (0.4 mg total) under the tongue every 5 (five) minutes as needed for chest pain.   ranitidine 75 MG tablet Commonly known as:  ZANTAC Take 75 mg by mouth 2 (two) times daily.       Allergies: No Known Allergies  Past Medical History:  Diagnosis Date  . CAD (coronary artery disease)    a. 04/2013 Inf STEMI/PCI: LM nl, LAD min irregs, D1/2/3 nl, LCX 71m (2.5x18 Xience DES), OM1/2/3 nl, RCA nl, PDA nl, RPAV nl, RPL nl, EF 55%;  b. 04/2013  Echo: EF 55-60%, no rwma, mild MR, mildly dil LA.  . Diabetes mellitus type 2, uncontrolled ([place])    a. 04/2013 HbA1c = 11.7.  . Hyperlipidemia   . Hypertension  Past Surgical History:  Procedure Laterality Date  . CESAREAN SECTION    . LEFT HEART CATH  05-15-13  . LEFT HEART CATHETERIZATION WITH CORONARY ANGIOGRAM N/A 05/15/2013   Procedure: LEFT HEART CATHETERIZATION WITH CORONARY ANGIOGRAM;  Surgeon: Wellington Hampshire, MD;  Location: [facility] CATH LAB;  Service: Cardiovascular;  Laterality: N/A;  . PERCUTANEOUS STENT INTERVENTION  05/15/2013   Procedure: PERCUTANEOUS STENT INTERVENTION;  Surgeon: Wellington Hampshire, MD;  Location: [facility] C[facility] LAB;  Service: Cardiovascular;;    Family History  Problem Relation Age of Onset  . Heart disease Mother   . Diabetes Mother   . Hypertension Mother   . Heart disease Father   . Diabetes Father   . Hypertension Father   . Diabetes Brother     Social History:  reports that she has never smoked. She has never used smokeless tobacco. She reports that she does not drink alcohol or use drugs.    Review of Systems        Lipids: She Has been on Lipitor 80 mg.  Lipids are well controlled, has history of CAD  she does see the cardiologist periodically      Lab Results  Component Value Date   CHOL 107 10/11/2015   HDL 40.50 10/11/2015   LDLCALC 49 10/11/2015   TRIG 85.0 10/11/2015   CHOLHDL 3 10/11/2015                  The blood pressure has been controlled with lisinopril     LABS:  Lab on 04/21/2016  Component Date Value Ref Range Status  . Hgb A1c MFr Bld 04/21/2016 7.4* 4.6 - 6.5 % Final  . Sodium 04/21/2016 140  135 - 145 mEq/L Final  . Potassium 04/21/2016 4.8  3.5 - 5.1 mEq/L Final  . Chloride 04/21/2016 107  96 - 112 mEq/L Final  . CO2 04/21/2016 27  19 - 32 mEq/L Final  . Glucose, Bld 04/21/2016 126* 70 - 99 mg/dL Final  . BUN 04/21/2016 14  6 - 23 mg/dL Final  . Creatinine, Ser 04/21/2016 0.72  0.40 - 1.20 mg/dL Final  .  Calcium 04/21/2016 9.7  8.4 - 10.5 mg/dL Final  . GFR 04/21/2016 87.25  >60.00 mL/min Final    Physical Examination:  BP 138/80   Pulse 74   Ht 4\' 11"  (1.499 m)   Wt 134 lb (60.8 kg)   SpO2 98%   BMI 27.06 kg/m    ASSESSMENT:  Diabetes type 2, uncontrolled with obesity  See history of present illness for detailed discussion of current diabetes management, blood sugar patterns and problems identified  She has had fairly good blood sugar control with  Good blood sugar readings at home but A1c is higher than expected at 7.4 again  although her postprandial readings tend to be better with using Precose including the 50 mg dose at lunch she is not complaining of GI side effects with this. Also having some mild vaginitis with continuing Invokana  she has gained weight better readings after meals with a 50 mg dose Precose before her main meal at lunch She has been very compliant with her exercise regimen Also checking blood sugars at least twice a day Fasting readings are consistent  And averaging about 110  PLAN:   Will temporarily reduce her Precose to half tablet at breakfast and 25 mg at lunch.  She needs to stop eating cereal at breakfast and start adding protein, discussed options.  She will be  treated with Diflucan 1 time and she can try to use the OTC Monistat  Follow-up in 3 months with repeat A1c   Patient Instructions  Acarbose 1/2 at Breakfast and 1 at lunch  No Cereal, have protein in am     [facility] 04/23/2016, 8:52 PM   Note: This office note was prepared with Dragon voice recognition system technology. Any transcriptional errors that result from this process are unintentional.

## 2016-04-24 ENCOUNTER — Ambulatory Visit: Payer: Medicaid Other | Admitting: Endocrinology

## 2016-04-24 DIAGNOSIS — Z23 Encounter for immunization: Secondary | ICD-10-CM | POA: Diagnosis not present

## 2016-04-29 ENCOUNTER — Telehealth: Payer: Self-pay | Admitting: Endocrinology

## 2016-04-29 NOTE — Telephone Encounter (Signed)
Pt is going over sees she needs a 90 day supply of her meds to be called in all of the meds to be [facility] into [facility] on [address]  I see this was dont on 11/8 however they are telling the pt's son they have not received the rxs

## 2016-04-30 ENCOUNTER — Other Ambulatory Visit: Payer: Self-pay

## 2016-04-30 MED ORDER — GLUCOSE BLOOD VI STRP: ORAL_STRIP | 1 refills | Status: DC

## 2016-04-30 MED ORDER — ACARBOSE 25 MG PO TABS: ORAL_TABLET | ORAL | 3 refills | Status: DC

## 2016-04-30 MED ORDER — CANAGLIFLOZIN 100 MG PO TABS: 100.0000 mg | ORAL_TABLET | Freq: Every day | ORAL | 1 refills | Status: DC

## 2016-04-30 MED ORDER — METFORMIN HCL ER 500 MG PO TB24: 1500.0000 mg | ORAL_TABLET | Freq: Every day | ORAL | 1 refills | Status: DC

## 2016-04-30 NOTE — Telephone Encounter (Signed)
Ordered 04/30/16

## 2016-05-01 ENCOUNTER — Other Ambulatory Visit: Payer: Self-pay

## 2016-05-01 ENCOUNTER — Telehealth: Payer: Self-pay | Admitting: Endocrinology

## 2016-05-01 MED ORDER — ACARBOSE 25 MG PO TABS: ORAL_TABLET | ORAL | 3 refills | Status: DC

## 2016-05-01 MED ORDER — CANAGLIFLOZIN 100 MG PO TABS: 100.0000 mg | ORAL_TABLET | Freq: Every day | ORAL | 1 refills | Status: DC

## 2016-05-01 NOTE — Telephone Encounter (Signed)
Pt needs her Invokana and Acarbose sent to the [facility], their number is [phone]

## 2016-05-02 MED ORDER — ACARBOSE 25 MG PO TABS: ORAL_TABLET | ORAL | 3 refills | Status: DC

## 2016-05-02 MED ORDER — CANAGLIFLOZIN 100 MG PO TABS: 100.0000 mg | ORAL_TABLET | Freq: Every day | ORAL | 1 refills | Status: DC

## 2016-05-02 NOTE — Telephone Encounter (Signed)
Refills submitted per patient's request.  

## 2016-07-21 ENCOUNTER — Other Ambulatory Visit: Payer: Medicaid Other

## 2016-07-24 ENCOUNTER — Ambulatory Visit: Payer: Medicaid Other | Admitting: Endocrinology

## 2016-07-24 ENCOUNTER — Telehealth: Payer: Self-pay | Admitting: Endocrinology

## 2016-07-24 NOTE — Telephone Encounter (Signed)
Patient no showed today's appt. Please advise on how to follow up. °A. No follow up necessary. °B. Follow up urgent. Contact patient immediately. °C. Follow up necessary. Contact patient and schedule visit in ___ days. °D. Follow up advised. Contact patient and schedule visit in ____weeks. ° °

## 2016-07-25 ENCOUNTER — Telehealth: Payer: Self-pay | Admitting: Endocrinology

## 2016-07-25 NOTE — Telephone Encounter (Signed)
Pt needs all of her prescriptions renewed and sent to [facility] on [address].  She just got back from [place] and is running low on everything.

## 2016-07-25 NOTE — Telephone Encounter (Signed)
Please reschedule, needs to be seen

## 2016-07-28 MED ORDER — CANAGLIFLOZIN 100 MG PO TABS: 100.0000 mg | ORAL_TABLET | Freq: Every day | ORAL | 1 refills | Status: DC

## 2016-07-28 MED ORDER — GLUCOSE BLOOD VI STRP: ORAL_STRIP | 1 refills | Status: DC

## 2016-07-28 MED ORDER — METFORMIN HCL ER 500 MG PO TB24: 1500.0000 mg | ORAL_TABLET | Freq: Every day | ORAL | 1 refills | Status: DC

## 2016-07-28 MED ORDER — ACARBOSE 25 MG PO TABS: ORAL_TABLET | ORAL | 3 refills | Status: DC

## 2016-07-28 NOTE — Telephone Encounter (Signed)
Refills submitted.  

## 2016-07-31 ENCOUNTER — Other Ambulatory Visit (INDEPENDENT_AMBULATORY_CARE_PROVIDER_SITE_OTHER): Payer: Medicaid Other

## 2016-07-31 DIAGNOSIS — E1165 Type 2 diabetes mellitus with hyperglycemia: Secondary | ICD-10-CM | POA: Diagnosis not present

## 2016-07-31 LAB — URINALYSIS, ROUTINE W REFLEX MICROSCOPIC
BILIRUBIN URINE: NEGATIVE
Ketones, ur: NEGATIVE
Nitrite: NEGATIVE
PH: 5.5 (ref 5.0–8.0)
Specific Gravity, Urine: 1.01 (ref 1.000–1.030)
TOTAL PROTEIN, URINE-UPE24: NEGATIVE
UROBILINOGEN UA: 0.2 (ref 0.0–1.0)
Urine Glucose: >=1000 — AB

## 2016-07-31 LAB — LIPID PANEL
CHOL/HDL RATIO: 3
Cholesterol: 95 mg/dL (ref 0–200)
HDL: 36 mg/dL — AB (ref >39.00)
LDL Cholesterol: 42 mg/dL (ref 0–99)
NonHDL: 58.56
TRIGLYCERIDES: 83 mg/dL (ref 0.0–149.0)
VLDL: 16.6 mg/dL (ref 0.0–40.0)

## 2016-07-31 LAB — COMPREHENSIVE METABOLIC PANEL
ALBUMIN: 4.1 g/dL (ref 3.5–5.2)
ALT: 10 U/L (ref 0–35)
AST: 13 U/L (ref 0–37)
Alkaline Phosphatase: 49 U/L (ref 39–117)
BUN: 9 mg/dL (ref 6–23)
CALCIUM: 9.6 mg/dL (ref 8.4–10.5)
CHLORIDE: 104 meq/L (ref 96–112)
CO2: 29 mEq/L (ref 19–32)
Creatinine, Ser: 0.65 mg/dL (ref 0.40–1.20)
GFR: 98.09 mL/min (ref >60.00)
Glucose, Bld: 121 mg/dL — ABNORMAL HIGH (ref 70–99)
POTASSIUM: 4.5 meq/L (ref 3.5–5.1)
SODIUM: 138 meq/L (ref 135–145)
TOTAL PROTEIN: 7.3 g/dL (ref 6.0–8.3)
Total Bilirubin: 0.5 mg/dL (ref 0.2–1.2)

## 2016-07-31 LAB — MICROALBUMIN / CREATININE URINE RATIO
CREATININE, U: 75.6 mg/dL
MICROALB/CREAT RATIO: 0.9 mg/g (ref 0.0–30.0)

## 2016-07-31 LAB — HEMOGLOBIN A1C: Hgb A1c MFr Bld: 7.5 % — ABNORMAL HIGH (ref 4.6–6.5)

## 2016-08-05 ENCOUNTER — Ambulatory Visit (INDEPENDENT_AMBULATORY_CARE_PROVIDER_SITE_OTHER): Payer: Medicaid Other | Admitting: Endocrinology

## 2016-08-05 ENCOUNTER — Other Ambulatory Visit: Payer: Self-pay

## 2016-08-05 ENCOUNTER — Encounter: Payer: Self-pay | Admitting: Endocrinology

## 2016-08-05 VITALS — BP 118/70 | HR 80 | Ht 59.0 in | Wt 132.0 lb

## 2016-08-05 DIAGNOSIS — E1165 Type 2 diabetes mellitus with hyperglycemia: Secondary | ICD-10-CM

## 2016-08-05 DIAGNOSIS — Z23 Encounter for immunization: Secondary | ICD-10-CM

## 2016-08-05 MED ORDER — CANAGLIFLOZIN 100 MG PO TABS: 100.0000 mg | ORAL_TABLET | Freq: Every day | ORAL | 1 refills | Status: DC

## 2016-08-05 MED ORDER — ACARBOSE 25 MG PO TABS: ORAL_TABLET | ORAL | 3 refills | Status: DC

## 2016-08-05 NOTE — Patient Instructions (Addendum)
Check blood sugars on waking up 3x weekly   Also check blood sugars about 2 hours after a meal and do this after different meals by rotation  Recommended blood sugar levels on waking up is 90-130 and about 2 hours after meal is 130-160  Please bring your blood sugar monitor to each visit, thank you  Walk daily  

## 2016-08-05 NOTE — Progress Notes (Signed)
Patient ID: [name] [name], female   DOB: 1955-07-07, 61 y.o.   MRN: [id number]           Reason for Appointment: Follow-up for Type 2 Diabetes   History of Present Illness:          Diagnosis: Type 2 diabetes mellitus, date of diagnosis: ?  2014   Past history:  The patient gives an inconsistent history. Her son thinks that she may have had diabetes diagnosed when she was in [place] about 2 years ago and was given unknown medications. When she moved to [facility] in 2014 she will not on any medications She was however found to have significant hyperglycemia with glucose 364 and A1c of 11.7 when she was admitted for her coronary artery disease in 04/2013. At that time she was started on premixed insulin twice a day Her level of control had not been adequate with persistently high A1c readings She did not tolerate Victoza well and was having decreased appetite, malaise and nausea and this was stopped subsequently  Recent history:        Oral hypoglycemic drugs:     Metformin ER 500 mg 3 tablets daily, Invokana 100 mg daily, Precose 25 mg before meals  She is on a 3 drug regimen and has A1c over 7% consistently, again 7.5 now Previously had been on insulin  Current blood sugar patterns and problems:  Her blood sugars are somewhat variable although overall not significantly high  This is despite her having difficulty getting her medication for about a month because of [place] until about a week or so ago  Karren Cobble was having some GI side effects with Precose but is tolerating this well now  Trying to take it before each meal but not taking 2 tablets as directed at lunch, also may not be taking the breakfast dose consistently  She is not getting any protein at breakfast, only having cereal or bread  She does walk regularly but didn't do so in winter months and also had a GI virus  Weight is improved    Hypoglycemia: None    Side effects from medications  have been: None  Self-care: The diet that the patient has been following is: None, has vegetarian diet Usually having vegetables and roti with lunch and dinner and sometimes having lentils.     Usually not eating fried food except for some snacks            Exercise:  walking almost daily either indoors or outdoors up to one hour         Dietician visit, none  Compliance with the medical regimen: Fair  Glucose monitoring:  done 2-3 times a day         Glucometer: Accu-Chek  Blood Glucose readings by meter download :  Mean values apply above for all meters except median for One Touch  PRE-MEAL Fasting Lunch Dinner Bedtime Overall  Glucose range: 101-141       Mean/median: 115     131   POST-MEAL PC Breakfast PC Lunch PC Dinner  Glucose range:  95-198  106-166   Mean/median:        Weight history:   Wt Readings from Last 3 Encounters:  08/05/16 132 lb (59.9 kg)  04/23/16 134 lb (60.8 kg)  04/21/16 134 lb (60.8 kg)    Glycemic control:   Lab Results  Component Value Date   HGBA1C 7.5 (H) 07/31/2016   HGBA1C 7.4 (H) 04/21/2016  HGBA1C 7.4 (H) 02/05/2016   Lab Results  Component Value Date   MICROALBUR <0.7 07/31/2016   LDLCALC 42 07/31/2016   CREATININE 0.65 07/31/2016    Lab on 07/31/2016  Component Date Value Ref Range Status  . Hgb A1c MFr Bld 07/31/2016 7.5* 4.6 - 6.5 % Final  . Sodium 07/31/2016 138  135 - 145 mEq/L Final  . Potassium 07/31/2016 4.5  3.5 - 5.1 mEq/L Final  . Chloride 07/31/2016 104  96 - 112 mEq/L Final  . CO2 07/31/2016 29  19 - 32 mEq/L Final  . Glucose, Bld 07/31/2016 121* 70 - 99 mg/dL Final  . BUN 07/31/2016 9  6 - 23 mg/dL Final  . Creatinine, Ser 07/31/2016 0.65  0.40 - 1.20 mg/dL Final  . Total Bilirubin 07/31/2016 0.5  0.2 - 1.2 mg/dL Final  . Alkaline Phosphatase 07/31/2016 49  39 - 117 U/L Final  . AST 07/31/2016 13  0 - 37 U/L Final  . ALT 07/31/2016 10  0 - 35 U/L Final  . Total Protein 07/31/2016 7.3  6.0 - 8.3 g/dL  Final  . Albumin 07/31/2016 4.1  3.5 - 5.2 g/dL Final  . Calcium 07/31/2016 9.6  8.4 - 10.5 mg/dL Final  . GFR 07/31/2016 98.09  >60.00 mL/min Final  . Microalb, Ur 07/31/2016 <0.7  0.0 - 1.9 mg/dL Final  . Creatinine,U 07/31/2016 75.6  mg/dL Final  . Microalb Creat Ratio 07/31/2016 0.9  0.0 - 30.0 mg/g Final  . Color, Urine 07/31/2016 YELLOW  Yellow;Lt. Yellow Final  . APPearance 07/31/2016 CLEAR  Clear Final  . Specific Gravity, Urine 07/31/2016 1.010  1.000 - 1.030 Final  . pH 07/31/2016 5.5  5.0 - 8.0 Final  . Total Protein, Urine 07/31/2016 NEGATIVE  Negative Final  . Urine Glucose 07/31/2016 >=1000* Negative Final  . Ketones, ur 07/31/2016 NEGATIVE  Negative Final  . Bilirubin Urine 07/31/2016 NEGATIVE  Negative Final  . Hgb urine dipstick 07/31/2016 TRACE-LYSED* Negative Final  . Urobilinogen, UA 07/31/2016 0.2  0.0 - 1.0 Final  . Leukocytes, UA 07/31/2016 SMALL* Negative Final  . Nitrite 07/31/2016 NEGATIVE  Negative Final  . WBC, UA 07/31/2016 7-10/hpf* 0-2/hpf Final  . RBC / HPF 07/31/2016 0-2/hpf  0-2/hpf Final  . Squamous Epithelial / LPF 07/31/2016 Rare(0-4/hpf)  Rare(0-4/hpf) Final  . Renal Epithel, UA 07/31/2016 Rare(0-4/hpf)* None Final  . Granular Casts, UA 07/31/2016 Presence of* None Final  . Cholesterol 07/31/2016 95  0 - 200 mg/dL Final  . Triglycerides 07/31/2016 83.0  0.0 - 149.0 mg/dL Final  . HDL 07/31/2016 36.00* >39.00 mg/dL Final  . VLDL 07/31/2016 16.6  0.0 - 40.0 mg/dL Final  . LDL Cholesterol 07/31/2016 42  0 - 99 mg/dL Final  . Total CHOL/HDL Ratio 07/31/2016 3   Final  . NonHDL 07/31/2016 58.56   Final      Allergies as of 08/05/2016   No Known Allergies     Medication List       Accurate as of 08/05/16  4:34 PM. Always use your most recent med list.          acarbose 25 MG tablet Commonly known as:  PRECOSE take 1 tablet by mouth AT BREAKFAST then take 2 tablets by mouth AT LUNCH and 1 tablet by mouth AT DINNER   aspirin EC 81 MG  tablet Take 1 tablet (81 mg total) by mouth daily.   atorvastatin 80 MG tablet Commonly known as:  LIPITOR Take 1 tablet (80 mg total) by  mouth daily at 6 PM.   canagliflozin 100 MG Tabs tablet Commonly known as:  INVOKANA Take 1 tablet (100 mg total) by mouth daily.   clopidogrel 75 MG tablet Commonly known as:  PLAVIX Take 1 tablet (75 mg total) by mouth daily.   fluconazole 150 MG tablet Commonly known as:  DIFLUCAN Take 1 tablet (150 mg total) by mouth daily.   glucose blood test strip Commonly known as:  ACCU-CHEK SMARTVIEW TEST three times a day as directed Dx code-E11.65   lisinopril 10 MG tablet Commonly known as:  PRINIVIL,ZESTRIL Take 1 tablet (10 mg total) by mouth daily.   metFORMIN 500 MG 24 hr tablet Commonly known as:  GLUCOPHAGE-XR Take 3 tablets (1,500 mg total) by mouth daily.   metoprolol tartrate 25 MG tablet Commonly known as:  LOPRESSOR take 1/2 tablets by mouth twice a day.   nitroGLYCERIN 0.4 MG SL tablet Commonly known as:  NITROSTAT Place 1 tablet (0.4 mg total) under the tongue every 5 (five) minutes as needed for chest pain.   ranitidine 75 MG tablet Commonly known as:  ZANTAC Take 75 mg by mouth 2 (two) times daily.       Allergies: No Known Allergies  Past Medical History:  Diagnosis Date  . CAD (coronary artery disease)    a. 04/2013 Inf STEMI/PCI: LM nl, LAD min irregs, D1/2/3 nl, LCX 9m (2.5x18 Xience DES), OM1/2/3 nl, RCA nl, PDA nl, RPAV nl, RPL nl, EF 55%;  b. 04/2013 Echo: EF 55-60%, no rwma, mild MR, mildly dil LA.  . Diabetes mellitus type 2, uncontrolled ([place])    a. 04/2013 HbA1c = 11.7.  . Hyperlipidemia   . Hypertension     Past Surgical History:  Procedure Laterality Date  . CESAREAN SECTION    . LEFT HEART CATH  05-15-13  . LEFT HEART CATHETERIZATION WITH CORONARY ANGIOGRAM N/A 05/15/2013   Procedure: LEFT HEART CATHETERIZATION WITH CORONARY ANGIOGRAM;  Surgeon: Wellington Hampshire, MD;  Location: [place] CATH LAB;   Service: Cardiovascular;  Laterality: N/A;  . PERCUTANEOUS STENT INTERVENTION  05/15/2013   Procedure: PERCUTANEOUS STENT INTERVENTION;  Surgeon: Wellington Hampshire, MD;  Location: [place] CATH LAB;  Service: Cardiovascular;;    Family History  Problem Relation Age of Onset  . Heart disease Mother   . Diabetes Mother   . Hypertension Mother   . Heart disease Father   . Diabetes Father   . Hypertension Father   . Diabetes Brother     Social History:  reports that she has never smoked. She has never used smokeless tobacco. She reports that she does not drink alcohol or use drugs.    Review of Systems        Lipids: She Has been on Lipitor 80 mg.  Lipids are well controlled, has history of CAD      Lab Results  Component Value Date   CHOL 95 07/31/2016   HDL 36.00 (L) 07/31/2016   LDLCALC 42 07/31/2016   TRIG 83.0 07/31/2016   CHOLHDL 3 07/31/2016                  The blood pressure has been controlled with lisinopril   Eye exam needed, Her son thinks he can now schedule her   LABS:  Lab on 07/31/2016  Component Date Value Ref Range Status  . Hgb A1c MFr Bld 07/31/2016 7.5* 4.6 - 6.5 % Final  . Sodium 07/31/2016 138  135 - 145 mEq/L Final  . Potassium  07/31/2016 4.5  3.5 - 5.1 mEq/L Final  . Chloride 07/31/2016 104  96 - 112 mEq/L Final  . CO2 07/31/2016 29  19 - 32 mEq/L Final  . Glucose, Bld 07/31/2016 121* 70 - 99 mg/dL Final  . BUN 07/31/2016 9  6 - 23 mg/dL Final  . Creatinine, Ser 07/31/2016 0.65  0.40 - 1.20 mg/dL Final  . Total Bilirubin 07/31/2016 0.5  0.2 - 1.2 mg/dL Final  . Alkaline Phosphatase 07/31/2016 49  39 - 117 U/L Final  . AST 07/31/2016 13  0 - 37 U/L Final  . ALT 07/31/2016 10  0 - 35 U/L Final  . Total Protein 07/31/2016 7.3  6.0 - 8.3 g/dL Final  . Albumin 07/31/2016 4.1  3.5 - 5.2 g/dL Final  . Calcium 07/31/2016 9.6  8.4 - 10.5 mg/dL Final  . GFR 07/31/2016 98.09  >60.00 mL/min Final  . Microalb, Ur 07/31/2016 <0.7  0.0 - 1.9 mg/dL Final  .  Creatinine,U 07/31/2016 75.6  mg/dL Final  . Microalb Creat Ratio 07/31/2016 0.9  0.0 - 30.0 mg/g Final  . Color, Urine 07/31/2016 YELLOW  Yellow;Lt. Yellow Final  . APPearance 07/31/2016 CLEAR  Clear Final  . Specific Gravity, Urine 07/31/2016 1.010  1.000 - 1.030 Final  . pH 07/31/2016 5.5  5.0 - 8.0 Final  . Total Protein, Urine 07/31/2016 NEGATIVE  Negative Final  . Urine Glucose 07/31/2016 >=1000* Negative Final  . Ketones, ur 07/31/2016 NEGATIVE  Negative Final  . Bilirubin Urine 07/31/2016 NEGATIVE  Negative Final  . Hgb urine dipstick 07/31/2016 TRACE-LYSED* Negative Final  . Urobilinogen, UA 07/31/2016 0.2  0.0 - 1.0 Final  . Leukocytes, UA 07/31/2016 SMALL* Negative Final  . Nitrite 07/31/2016 NEGATIVE  Negative Final  . WBC, UA 07/31/2016 7-10/hpf* 0-2/hpf Final  . RBC / HPF 07/31/2016 0-2/hpf  0-2/hpf Final  . Squamous Epithelial / LPF 07/31/2016 Rare(0-4/hpf)  Rare(0-4/hpf) Final  . Renal Epithel, UA 07/31/2016 Rare(0-4/hpf)* None Final  . Granular Casts, UA 07/31/2016 Presence of* None Final  . Cholesterol 07/31/2016 95  0 - 200 mg/dL Final  . Triglycerides 07/31/2016 83.0  0.0 - 149.0 mg/dL Final  . HDL 07/31/2016 36.00* >39.00 mg/dL Final  . VLDL 07/31/2016 16.6  0.0 - 40.0 mg/dL Final  . LDL Cholesterol 07/31/2016 42  0 - 99 mg/dL Final  . Total CHOL/HDL Ratio 07/31/2016 3   Final  . NonHDL 07/31/2016 58.56   Final    Physical Examination:  BP 118/70   Pulse 80   Ht 4\' 11"  (1.499 m)   Wt 132 lb (59.9 kg)   SpO2 97%   BMI 26.66 kg/m    ASSESSMENT:  Diabetes type 2, uncontrolled with obesity  See history of present illness for detailed discussion of current diabetes management, blood sugar patterns and problems identified  She has had fairly good blood sugar Readings at home but A1c is 7.5 This has been consistently high She is checking some readings after meals also More recently she has had difficulty getting all her medications until about a week  ago Also not taking Precose at breakfast recently We'll continue her multidrug regimen including Invokana  Urine microalbumin normal  PLAN:   She will add more protein such as lentils and yogurt although she prefers not to have much data products  Also encouraged her to add almonds at breakfast  She will take Precose at breakfast also  Follow-up in 3 months with repeat A1c  LIPIDS: Excellent control  PREVNAR given  Patient Instructions  Check blood sugars on waking up  3x weekly  Also check blood sugars about 2 hours after a meal and do this after different meals by rotation  Recommended blood sugar levels on waking up is 90-130 and about 2 hours after meal is 130-160  Please bring your blood sugar monitor to each visit, thank you  Walk daily    [facility] 08/05/2016, 4:34 PM   Note: This office note was prepared with Dragon voice recognition system technology. Any transcriptional errors that result from this process are unintentional.

## 2016-10-31 ENCOUNTER — Other Ambulatory Visit (INDEPENDENT_AMBULATORY_CARE_PROVIDER_SITE_OTHER): Payer: Medicaid Other

## 2016-10-31 DIAGNOSIS — E1165 Type 2 diabetes mellitus with hyperglycemia: Secondary | ICD-10-CM | POA: Diagnosis not present

## 2016-10-31 LAB — COMPREHENSIVE METABOLIC PANEL
ALBUMIN: 4.4 g/dL (ref 3.5–5.2)
ALT: 13 U/L (ref 0–35)
AST: 13 U/L (ref 0–37)
Alkaline Phosphatase: 47 U/L (ref 39–117)
BILIRUBIN TOTAL: 0.5 mg/dL (ref 0.2–1.2)
BUN: 10 mg/dL (ref 6–23)
CO2: 27 mEq/L (ref 19–32)
CREATININE: 0.73 mg/dL (ref 0.40–1.20)
Calcium: 9.8 mg/dL (ref 8.4–10.5)
Chloride: 106 mEq/L (ref 96–112)
GFR: 85.73 mL/min (ref >60.00)
Glucose, Bld: 134 mg/dL — ABNORMAL HIGH (ref 70–99)
POTASSIUM: 4.8 meq/L (ref 3.5–5.1)
SODIUM: 139 meq/L (ref 135–145)
TOTAL PROTEIN: 7.3 g/dL (ref 6.0–8.3)

## 2016-10-31 LAB — HEMOGLOBIN A1C: HEMOGLOBIN A1C: 7.7 % — AB (ref 4.6–6.5)

## 2016-11-04 ENCOUNTER — Ambulatory Visit (INDEPENDENT_AMBULATORY_CARE_PROVIDER_SITE_OTHER): Payer: Medicaid Other | Admitting: Endocrinology

## 2016-11-04 ENCOUNTER — Encounter: Payer: Self-pay | Admitting: Endocrinology

## 2016-11-04 VITALS — BP 128/79 | HR 72 | Ht 59.0 in | Wt 132.2 lb

## 2016-11-04 DIAGNOSIS — E1165 Type 2 diabetes mellitus with hyperglycemia: Secondary | ICD-10-CM

## 2016-11-04 MED ORDER — METFORMIN HCL ER 500 MG PO TB24: 2000.0000 mg | ORAL_TABLET | Freq: Every day | ORAL | 3 refills | Status: DC

## 2016-11-04 NOTE — Patient Instructions (Addendum)
4 metformin daily  More protein at each meal  Check blood sugars on waking up 2/7 days   Also check blood sugars about 2 hours after a meal and do this after different meals by rotation  Recommended blood sugar levels on waking up is 90-130 and about 2 hours after meal is 130-160  Please bring your blood sugar monitor to each visit, thank you

## 2016-11-04 NOTE — Progress Notes (Signed)
Patient ID: [name] [name], female   DOB: [birth year], 61 y.o.   MRN: [id number]           Reason for Appointment: Follow-up for Type 2 Diabetes   History of Present Illness:          Diagnosis: Type 2 diabetes mellitus, date of diagnosis: ?  2014   Past history:   Her son thinks that she may have had diabetes diagnosed when she was in [place] When she moved to [facility] in 2014 she was not on any medications She was however found to have significant hyperglycemia with glucose 364 and A1c of 11.7 when she was admitted for her coronary artery disease in 04/2013. At that time she was started on premixed insulin twice a day Her level of control had not been adequate with persistently high A1c readings She did not tolerate Victoza well and was having decreased appetite, malaise and nausea and this was stopped subsequently  Recent history:        Oral hypoglycemic drugs:     Metformin ER 500 mg 3 tablets daily, Invokana 100 mg daily, Precose 25 mg before meals   Her A1c has been over 7% and gradually increasing, now 7.7  Current blood sugar patterns and problems:  Her blood sugars are on an average fairly well controlled after her lunch and dinner but not clear why her A1c is higher  She does have occasional high readings which she thinks may be from eating more fruits  Otherwise she is trying to take her Precose right before eating usually  She was also told to increase the dose with adding 25 mg at breakfast also  No side effects from Invokana or metformin  She is not getting any protein at breakfast, only having a snack or bread  She does walk regularly but weight is about the same     Hypoglycemia: None    Side effects from medications have been: None  Self-care: The diet that the patient has been following is: None, has vegetarian diet Usually having vegetables and roti with lunch and dinner and sometimes having lentils.   She does not like yogurt as it  apparently causes heartburn  Usually not eating fried food except for some snacks            Exercise:  walking almost daily either indoors or outdoors up to one hour         Dietician visit, none  Compliance with the medical regimen: Fair  Glucose monitoring:  done 2-3 times a day         Glucometer: Accu-Chek  Blood Glucose readings by meter download :  Mean values apply above for all meters except median for One Touch  PRE-MEAL Fasting Lunch Dinner Bedtime Overall  Glucose range: 112-129       Mean/median: 120     1 39    POST-MEAL PC Breakfast PC Lunch PC Dinner  Glucose range:  84-210  10 7-220   Mean/median:  138  140    Weight history:   Wt Readings from Last 3 Encounters:  11/04/16 132 lb 3.2 oz (60 kg)  08/05/16 132 lb (59.9 kg)  04/23/16 134 lb (60.8 kg)    Glycemic control:   Lab Results  Component Value Date   HGBA1C 7.7 (H) 10/31/2016   HGBA1C 7.5 (H) 07/31/2016   HGBA1C 7.4 (H) 04/21/2016   Lab Results  Component Value Date   MICROALBUR <0.7 07/31/2016   [place] 42  07/31/2016   CREATININE 0.73 10/31/2016    Lab on 10/31/2016  Component Date Value Ref Range Status  . Hgb A1c MFr Bld 10/31/2016 7.7* 4.6 - 6.5 % Final   Glycemic Control Guidelines for People with Diabetes:Non Diabetic:  <6%Goal of Therapy: <7%Additional Action Suggested:  >8%   . Sodium 10/31/2016 139  135 - 145 mEq/L Final  . Potassium 10/31/2016 4.8  3.5 - 5.1 mEq/L Final  . Chloride 10/31/2016 106  96 - 112 mEq/L Final  . CO2 10/31/2016 27  19 - 32 mEq/L Final  . Glucose, Bld 10/31/2016 134* 70 - 99 mg/dL Final  . BUN 10/31/2016 10  6 - 23 mg/dL Final  . Creatinine, Ser 10/31/2016 0.73  0.40 - 1.20 mg/dL Final  . Total Bilirubin 10/31/2016 0.5  0.2 - 1.2 mg/dL Final  . Alkaline Phosphatase 10/31/2016 47  39 - 117 U/L Final  . AST 10/31/2016 13  0 - 37 U/L Final  . ALT 10/31/2016 13  0 - 35 U/L Final  . Total Protein 10/31/2016 7.3  6.0 - 8.3 g/dL Final  . Albumin 10/31/2016  4.4  3.5 - 5.2 g/dL Final  . Calcium 10/31/2016 9.8  8.4 - 10.5 mg/dL Final  . GFR 10/31/2016 85.73  >60.00 mL/min Final      Allergies as of 11/04/2016   No Known Allergies     Medication List       Accurate as of 11/04/16 12:36 PM. Always use your most recent med list.          acarbose 25 MG tablet Commonly known as:  PRECOSE take 1 tablet by mouth AT BREAKFAST then take 2 tablets by mouth AT LUNCH and 1 tablet by mouth AT DINNER   aspirin EC 81 MG tablet Take 1 tablet (81 mg total) by mouth daily.   atorvastatin 80 MG tablet Commonly known as:  LIPITOR Take 1 tablet (80 mg total) by mouth daily at 6 PM.   canagliflozin 100 MG Tabs tablet Commonly known as:  INVOKANA Take 1 tablet (100 mg total) by mouth daily.   clopidogrel 75 MG tablet Commonly known as:  PLAVIX Take 1 tablet (75 mg total) by mouth daily.   fluconazole 150 MG tablet Commonly known as:  DIFLUCAN Take 1 tablet (150 mg total) by mouth daily.   glucose blood test strip Commonly known as:  ACCU-CHEK SMARTVIEW TEST three times a day as directed Dx code-E11.65   lisinopril 10 MG tablet Commonly known as:  PRINIVIL,ZESTRIL Take 1 tablet (10 mg total) by mouth daily.   metFORMIN 500 MG 24 hr tablet Commonly known as:  GLUCOPHAGE-XR Take 4 tablets (2,000 mg total) by mouth daily with supper.   metoprolol tartrate 25 MG tablet Commonly known as:  LOPRESSOR take 1/2 tablets by mouth twice a day.   nitroGLYCERIN 0.4 MG SL tablet Commonly known as:  NITROSTAT Place 1 tablet (0.4 mg total) under the tongue every 5 (five) minutes as needed for chest pain.   ranitidine 75 MG tablet Commonly known as:  ZANTAC Take 75 mg by mouth 2 (two) times daily.       Allergies: No Known Allergies  Past Medical History:  Diagnosis Date  . CAD (coronary artery disease)    a. 04/2013 Inf STEMI/PCI: LM nl, LAD min irregs, D1/2/3 nl, LCX 7m (2.5x18 Xience DES), OM1/2/3 nl, RCA nl, PDA nl, RPAV nl, RPL nl,  EF 55%;  b. 04/2013 Echo: EF 55-60%, no rwma, mild MR, mildly  dil LA.  . Diabetes mellitus type 2, uncontrolled ([place])    a. 04/2013 HbA1c = 11.7.  . Hyperlipidemia   . Hypertension     Past Surgical History:  Procedure Laterality Date  . CESAREAN SECTION    . LEFT HEART CATH  05-15-13  . LEFT HEART CATHETERIZATION WITH CORONARY ANGIOGRAM N/A 05/15/2013   Procedure: LEFT HEART CATHETERIZATION WITH CORONARY ANGIOGRAM;  Surgeon: Wellington Hampshire, MD;  Location: [place] CATH LAB;  Service: Cardiovascular;  Laterality: N/A;  . PERCUTANEOUS STENT INTERVENTION  05/15/2013   Procedure: PERCUTANEOUS STENT INTERVENTION;  Surgeon: Wellington Hampshire, MD;  Location: [place] CATH LAB;  Service: Cardiovascular;;    Family History  Problem Relation Age of Onset  . Heart disease Mother   . Diabetes Mother   . Hypertension Mother   . Heart disease Father   . Diabetes Father   . Hypertension Father   . Diabetes Brother     Social History:  reports that she has never smoked. She has never used smokeless tobacco. She reports that she does not drink alcohol or use drugs.    Review of Systems        Lipids: She Has been on Lipitor 80 mg.  Lipids are well controlled, has history of CAD      Lab Results  Component Value Date   CHOL 95 07/31/2016   HDL 36.00 (L) 07/31/2016   LDLCALC 42 07/31/2016   TRIG 83.0 07/31/2016   CHOLHDL 3 07/31/2016                  The blood pressure has been controlled with lisinopril   Eye exam needed And still has not made an appointment   LABS:  Lab on 10/31/2016  Component Date Value Ref Range Status  . Hgb A1c MFr Bld 10/31/2016 7.7* 4.6 - 6.5 % Final   Glycemic Control Guidelines for People with Diabetes:Non Diabetic:  <6%Goal of Therapy: <7%Additional Action Suggested:  >8%   . Sodium 10/31/2016 139  135 - 145 mEq/L Final  . Potassium 10/31/2016 4.8  3.5 - 5.1 mEq/L Final  . Chloride 10/31/2016 106  96 - 112 mEq/L Final  . CO2 10/31/2016 27  19 - 32 mEq/L Final   . Glucose, Bld 10/31/2016 134* 70 - 99 mg/dL Final  . BUN 10/31/2016 10  6 - 23 mg/dL Final  . Creatinine, Ser 10/31/2016 0.73  0.40 - 1.20 mg/dL Final  . Total Bilirubin 10/31/2016 0.5  0.2 - 1.2 mg/dL Final  . Alkaline Phosphatase 10/31/2016 47  39 - 117 U/L Final  . AST 10/31/2016 13  0 - 37 U/L Final  . ALT 10/31/2016 13  0 - 35 U/L Final  . Total Protein 10/31/2016 7.3  6.0 - 8.3 g/dL Final  . Albumin 10/31/2016 4.4  3.5 - 5.2 g/dL Final  . Calcium 10/31/2016 9.8  8.4 - 10.5 mg/dL Final  . GFR 10/31/2016 85.73  >60.00 mL/min Final    Physical Examination:  BP 128/79   Pulse 72   Ht 4\' 11"  (1.499 m)   Wt 132 lb 3.2 oz (60 kg)   SpO2 98%   BMI 26.70 kg/m    ASSESSMENT:  Diabetes type 2, uncontrolled with obesity  See history of present illness for detailed discussion of current diabetes management, blood sugar patterns and problems identified  She has had A relatively higher A1c of 7.7 However not clear why A1c is higher than expected when she does not have any  consistently high readings at any time including after meals Her portions are fairly controlled but she does not always have protein at meals especially breakfast Not losing any more weight despite continuing Invokana and trying to walk regularly   PLAN:   She will add another metformin to maximize her dose up to 2000 mg, may take 2 tablets twice a day  She needs to have more protein such as lentils and nuts  Confirm A1c accuracy with fructosamine next   Follow-up in 3 months with repeat A1c    Patient Instructions  4 metformin daily  More protein at each meal  Check blood sugars on waking up 2/7 days   Also check blood sugars about 2 hours after a meal and do this after different meals by rotation  Recommended blood sugar levels on waking up is 90-130 and about 2 hours after meal is 130-160  Please bring your blood sugar monitor to each visit, thank you    [facility] 11/04/2016, 12:36 PM    Note: This office note was prepared with Dragon voice recognition system technology. Any transcriptional errors that result from this process are unintentional.

## 2017-01-30 ENCOUNTER — Other Ambulatory Visit (INDEPENDENT_AMBULATORY_CARE_PROVIDER_SITE_OTHER): Payer: Medicaid Other

## 2017-01-30 DIAGNOSIS — E1165 Type 2 diabetes mellitus with hyperglycemia: Secondary | ICD-10-CM | POA: Diagnosis not present

## 2017-01-30 LAB — BASIC METABOLIC PANEL
BUN: 10 mg/dL (ref 6–23)
CALCIUM: 9.3 mg/dL (ref 8.4–10.5)
CHLORIDE: 107 meq/L (ref 96–112)
CO2: 26 mEq/L (ref 19–32)
CREATININE: 0.71 mg/dL (ref 0.40–1.20)
GFR: 88.45 mL/min (ref >60.00)
Glucose, Bld: 130 mg/dL — ABNORMAL HIGH (ref 70–99)
Potassium: 4.7 mEq/L (ref 3.5–5.1)
Sodium: 141 mEq/L (ref 135–145)

## 2017-01-30 LAB — HEMOGLOBIN A1C: HEMOGLOBIN A1C: 7.3 % — AB (ref 4.6–6.5)

## 2017-01-31 LAB — FRUCTOSAMINE: Fructosamine: 224 umol/L (ref 0–285)

## 2017-02-04 ENCOUNTER — Encounter: Payer: Self-pay | Admitting: Endocrinology

## 2017-02-04 ENCOUNTER — Ambulatory Visit (INDEPENDENT_AMBULATORY_CARE_PROVIDER_SITE_OTHER): Payer: Medicaid Other | Admitting: Endocrinology

## 2017-02-04 VITALS — BP 130/80 | HR 64 | Ht 59.0 in | Wt 133.8 lb

## 2017-02-04 DIAGNOSIS — I10 Essential (primary) hypertension: Secondary | ICD-10-CM

## 2017-02-04 DIAGNOSIS — E1165 Type 2 diabetes mellitus with hyperglycemia: Secondary | ICD-10-CM

## 2017-02-04 DIAGNOSIS — G5603 Carpal tunnel syndrome, bilateral upper limbs: Secondary | ICD-10-CM | POA: Diagnosis not present

## 2017-02-04 DIAGNOSIS — E1142 Type 2 diabetes mellitus with diabetic polyneuropathy: Secondary | ICD-10-CM | POA: Diagnosis not present

## 2017-02-04 NOTE — Progress Notes (Signed)
Patient ID: [name] [name], female   DOB: 1955-10-11, 61 y.o.   MRN: [id number]           Reason for Appointment: Follow-up for Type 2 Diabetes   History of Present Illness:          Diagnosis: Type 2 diabetes mellitus, date of diagnosis: ?  2014   Past history:   Her son thinks that she may have had diabetes diagnosed when sh 0e was in [place] When she moved to [place] in 2014 she was not on any medications She was however found to have significant hyperglycemia with glucose 364 and A1c of 11.7 when she was admitted for her coronary artery disease in 04/2013. At that time she was started on premixed insulin twice a day Her level of control had not been adequate with persistently high A1c readings She did not tolerate Victoza well and was having decreased appetite, malaise and nausea and this was stopped subsequently  Recent history:        Oral hypoglycemic drugs:     Metformin ER 500 mg 4 tablets daily, Invokana 100 mg daily, Precose 25 mg before meals   Her A1c has been over 7% and now is slightly better at 7.3, highest 7.7 Blood sugars are usually lower than expected for her A1c Her A1c appears to be higher than expected because of possible interference since her fructosamine is only 224   Current blood sugar patterns and problems:  She is trying to check her blood sugars fairly consistently after meals although mostly after breakfast and lunch  She has occasional FASTING readings which are near normal  Fasting readings may be slightly better with adding  Metformin on her visit in May 2018  Her blood sugars are on an average fairly well controlled after her lunch and may be slightly higher at times after dinner with highest reading 215  She has been told to try and add protein to have a meal but this is not consistent especially at breakfast  Her weight is still about the same and has difficulty losing weight despite trying to walk regularly  She is tolerating  Invokana regularly and has not had issues with yeast infections lately  Hypoglycemia: None    Side effects from medications have been: None  Self-care: The diet that the patient has been following is: None, has vegetarian diet Usually having vegetables and roti with lunch and dinner and sometimes having lentils.   She does not like yogurt as it apparently causes heartburn  Usually not eating fried food except for some snacks            Exercise:  walking almost daily either indoors or outdoors up to one hour         Dietician visit, none  Compliance with the medical regimen: Fair  Glucose monitoring:  done up to 2 times a day         Glucometer: Accu-Chek  Blood Glucose readings by meter download   Mean values apply above for all meters except median for One Touch  PRE-MEAL Fasting Lunch Dinner Bedtime Overall  Glucose range:    92-215    Mean/median: 110     136   POST-MEAL PC Breakfast PC Lunch PC Dinner  Glucose range:  96-1 62    Mean/median: 144  132  146      Weight history:   Wt Readings from Last 3 Encounters:  02/04/17 133 lb 12.8 oz (60.7 kg)  11/04/16  132 lb 3.2 oz (60 kg)  08/05/16 132 lb (59.9 kg)    Glycemic control:   Lab Results  Component Value Date   HGBA1C 7.3 (H) 01/30/2017   HGBA1C 7.7 (H) 10/31/2016   HGBA1C 7.5 (H) 07/31/2016   Lab Results  Component Value Date   MICROALBUR <0.7 07/31/2016   LDLCALC 42 07/31/2016   CREATININE 0.71 01/30/2017    Lab on 01/30/2017  Component Date Value Ref Range Status  . Hgb A1c MFr Bld 01/30/2017 7.3* 4.6 - 6.5 % Final   Glycemic Control Guidelines for People with Diabetes:Non Diabetic:  <6%Goal of Therapy: <7%Additional Action Suggested:  >8%   . Sodium 01/30/2017 141  135 - 145 mEq/L Final  . Potassium 01/30/2017 4.7  3.5 - 5.1 mEq/L Final  . Chloride 01/30/2017 107  96 - 112 mEq/L Final  . CO2 01/30/2017 26  19 - 32 mEq/L Final  . Glucose, Bld 01/30/2017 130* 70 - 99 mg/dL Final  . BUN  01/30/2017 10  6 - 23 mg/dL Final  . Creatinine, Ser 01/30/2017 0.71  0.40 - 1.20 mg/dL Final  . Calcium 01/30/2017 9.3  8.4 - 10.5 mg/dL Final  . GFR 01/30/2017 88.45  >60.00 mL/min Final  . Fructosamine 01/30/2017 224  0 - 285 umol/L Final   Comment: Published reference interval for apparently healthy subjects between age 11 and 92 is 74 - 285 umol/L and in a poorly controlled diabetic population is 228 - 563 umol/L with a mean of 396 umol/L.       Allergies as of 02/04/2017   No Known Allergies     Medication List       Accurate as of 02/04/17  9:15 PM. Always use your most recent med list.          acarbose 25 MG tablet Commonly known as:  PRECOSE take 1 tablet by mouth AT BREAKFAST then take 2 tablets by mouth AT LUNCH and 1 tablet by mouth AT DINNER   aspirin EC 81 MG tablet Take 1 tablet (81 mg total) by mouth daily.   atorvastatin 80 MG tablet Commonly known as:  LIPITOR Take 1 tablet (80 mg total) by mouth daily at 6 PM.   canagliflozin 100 MG Tabs tablet Commonly known as:  INVOKANA Take 1 tablet (100 mg total) by mouth daily.   clopidogrel 75 MG tablet Commonly known as:  PLAVIX Take 1 tablet (75 mg total) by mouth daily.   fluconazole 150 MG tablet Commonly known as:  DIFLUCAN Take 1 tablet (150 mg total) by mouth daily.   glucose blood test strip Commonly known as:  ACCU-CHEK SMARTVIEW TEST three times a day as directed Dx code-E11.65   lisinopril 10 MG tablet Commonly known as:  PRINIVIL,ZESTRIL Take 1 tablet (10 mg total) by mouth daily.   metFORMIN 500 MG 24 hr tablet Commonly known as:  GLUCOPHAGE-XR Take 4 tablets (2,000 mg total) by mouth daily with supper.   metoprolol tartrate 25 MG tablet Commonly known as:  LOPRESSOR take 1/2 tablets by mouth twice a day.   nitroGLYCERIN 0.4 MG SL tablet Commonly known as:  NITROSTAT Place 1 tablet (0.4 mg total) under the tongue every 5 (five) minutes as needed for chest pain.   ranitidine 75  MG tablet Commonly known as:  ZANTAC Take 75 mg by mouth 2 (two) times daily.       Allergies: No Known Allergies  Past Medical History:  Diagnosis Date  . CAD (coronary artery disease)  a. 04/2013 Inf STEMI/PCI: LM nl, LAD min irregs, D1/2/3 nl, LCX 50m (2.5x18 Xience DES), OM1/2/3 nl, RCA nl, PDA nl, RPAV nl, RPL nl, EF 55%;  b. 04/2013 Echo: EF 55-60%, no rwma, mild MR, mildly dil LA.  . Diabetes mellitus type 2, uncontrolled ([place])    a. 04/2013 HbA1c = 11.7.  . Hyperlipidemia   . Hypertension     Past Surgical History:  Procedure Laterality Date  . CESAREAN SECTION    . LEFT HEART CATH  05-15-13  . LEFT HEART CATHETERIZATION WITH CORONARY ANGIOGRAM N/A 05/15/2013   Procedure: LEFT HEART CATHETERIZATION WITH CORONARY ANGIOGRAM;  Surgeon: Wellington Hampshire, MD;  Location: [facility];  Service: Cardiovascular;  Laterality: N/A;  . PERCUTANEOUS STENT INTERVENTION  05/15/2013   Procedure: PERCUTANEOUS STENT INTERVENTION;  Surgeon: Wellington Hampshire, MD;  Location: [facility];  Service: Cardiovascular;;    Family History  Problem Relation Age of Onset  . Heart disease Mother   . Diabetes Mother   . Hypertension Mother   . Heart disease Father   . Diabetes Father   . Hypertension Father   . Diabetes Brother     Social History:  reports that she has never smoked. She has never used smokeless tobacco. She reports that she does not drink alcohol or use drugs.    Review of Systems        Today she is complaining about numbness in her hands bilaterally although sometimes more on the left This may be more prevalent on waking up in the morning but not much at night Has a little stiffness in her hands when trying to use them but not having much tingling or numbness during the day  She sometimes has soreness in her legs including feet but not sharp pains or pins and needles or tingling She does not have difficulty sleeping because of this   Lipids: She Has been on Lipitor  80 mg.  Lipids are well controlled, has history of CAD      Lab Results  Component Value Date   CHOL 95 07/31/2016   HDL 36.00 (L) 07/31/2016   LDLCALC 42 07/31/2016   TRIG 83.0 07/31/2016   CHOLHDL 3 07/31/2016                  The blood pressure has been controlled with lisinopril  Last night her son thinks her blood pressure 294 systolic when she was feeling somewhat uneasy and her legs were hurting He thinks her blood pressure is usually about 765 systolic or less and is concerned about this  Eye exam overdue, still has not made an appointment   LABS:  Lab on 01/30/2017  Component Date Value Ref Range Status  . Hgb A1c MFr Bld 01/30/2017 7.3* 4.6 - 6.5 % Final   Glycemic Control Guidelines for People with Diabetes:Non Diabetic:  <6%Goal of Therapy: <7%Additional Action Suggested:  >8%   . Sodium 01/30/2017 141  135 - 145 mEq/L Final  . Potassium 01/30/2017 4.7  3.5 - 5.1 mEq/L Final  . Chloride 01/30/2017 107  96 - 112 mEq/L Final  . CO2 01/30/2017 26  19 - 32 mEq/L Final  . Glucose, Bld 01/30/2017 130* 70 - 99 mg/dL Final  . BUN 01/30/2017 10  6 - 23 mg/dL Final  . Creatinine, Ser 01/30/2017 0.71  0.40 - 1.20 mg/dL Final  . Calcium 01/30/2017 9.3  8.4 - 10.5 mg/dL Final  . GFR 01/30/2017 88.45  >60.00 mL/min Final  .  Fructosamine 01/30/2017 224  0 - 285 umol/L Final   Comment: Published reference interval for apparently healthy subjects between age 58 and 53 is 54 - 285 umol/L and in a poorly controlled diabetic population is 228 - 563 umol/L with a mean of 396 umol/L.     Physical Examination:  BP 130/80   Pulse 64   Ht 4\' 11"  (1.499 m)   Wt 133 lb 12.8 oz (60.7 kg)   SpO2 98%   BMI 27.02 kg/m    Repeat blood pressure 128/72  Tinel sign present on the left  Diabetic Foot Exam - Simple   Simple Foot Form Diabetic Foot exam was performed with the following findings:  Yes   Visual Inspection No deformities, no ulcerations, no other skin breakdown  bilaterally:  Yes Sensation Testing Intact to touch and monofilament testing bilaterally:  Yes See comments:  Yes Pulse Check Posterior Tibialis and Dorsalis pulse intact bilaterally:  Yes See comments:  Yes Comments Posterior tibialis pulses not felt Has inconsistent decrease in monofilament sensation in the toes      ASSESSMENT:  Diabetes type 2, uncontrolled with obesity  See history of present illness for detailed discussion of current diabetes management, blood sugar patterns and problems identified  She has had An improved A1c of 7.3 compared to before Her A1c appears to be higher than expected because of possible interference since her fructosamine is only 224  Most of her blood sugars are excellent including postprandial and fasting and she is doing very well with monitoring at various times as directed Also taking Precose usually before meals as directed However still tending to have high carbohydrate meals without adequate protein at times Despite using Invokana she is still having difficulty losing weight  NEUROPATHY: She has what appears to be mild sensory loss but not wanting treatment for leg discomfort  CARPAL tunnel syndrome: She appears to be symptomatic numbness in the mornings and some difficulty using her hands  HYPERTENSION: Well controlled   PLAN:   She will add protein to all meals consistently  Consider increasing Invokana if A1c closer  No change in blood pressure medications, reassured her that blood pressure is fairly good  Prescription given for wrist splints to wear at night  Check TSH on next visit  Follow-up in 3 months Schedule   There are no Patient Instructions on file for this visit.  Arnie Maiolo 02/04/2017, 9:15 PM   Counseling time on subjects discussed in assessment and plan sections is over 50% of today's 25 minute visit  Note: This office note was prepared with Estate agent. Any  transcriptional errors that result from this process are unintentional.

## 2017-02-20 ENCOUNTER — Encounter: Payer: Self-pay | Admitting: Cardiology

## 2017-02-20 ENCOUNTER — Ambulatory Visit (INDEPENDENT_AMBULATORY_CARE_PROVIDER_SITE_OTHER): Payer: Medicaid Other | Admitting: Cardiology

## 2017-02-20 VITALS — BP 118/68 | HR 76 | Ht 60.0 in | Wt 131.8 lb

## 2017-02-20 DIAGNOSIS — I2510 Atherosclerotic heart disease of native coronary artery without angina pectoris: Secondary | ICD-10-CM | POA: Diagnosis not present

## 2017-02-20 NOTE — Patient Instructions (Addendum)
Medication Instructions: Your physician recommends that you continue on your current medications as directed. Please refer to the Current Medication list given to you today.  Labwork: None Ordered  Procedures/Testing: None Ordered  Follow-Up: Your physician wants you to follow-up on Tuesday April 21, 2017 with Truitt Merle, NP for Dr. Marlou Porch  If you need a refill on your cardiac medications before your next appointment, please call your pharmacy.

## 2017-02-20 NOTE — Progress Notes (Signed)
02/20/2017 [name] [name]   31-Aug-1955  [id number]  Primary Physician Patient, No Pcp Per Primary Cardiologist: Formerly Dr. Aundra Dubin >>>Dr. Marlou Porch    Reason for Visit/CC: f/u for CAD  HPI:  61 y.o. female with a history of diabetes, HTN, and CAD s/p DES to LCx who presents to clinic today for f/u. She is [place] and speaks very little english. Her son is here with her and assist with translation. She has been followed by Dr. Dr. Aundra Dubin but plan was for her to transition to Dr. Marlou Porch. Her last OV was 04/21/2016. She was seen by a PA. She was doing well w/o cardiac issues. Her other PMH includes HTN, T2 DM and HLD. Per records, pt was admitted in 11/14 with inferior STEMI. She had DES to mLCx. EF was preserved.   Today in clinic, she reports that she has done well. She denies any cardiac symptoms. No CP or dyspnea. She walks daily for exercise. No exertional symptoms. She reports full medication compliance. BP is well controlled. EKG shows NSR.   Current Meds  Medication Sig  . acarbose (PRECOSE) 25 MG tablet take 1 tablet by mouth AT BREAKFAST then take 2 tablets by mouth AT LUNCH and 1 tablet by mouth AT DINNER  . aspirin EC 81 MG tablet Take 1 tablet (81 mg total) by mouth daily.  Marland Kitchen atorvastatin (LIPITOR) 80 MG tablet Take 1 tablet (80 mg total) by mouth daily at 6 PM.  . canagliflozin (INVOKANA) 100 MG TABS tablet Take 1 tablet (100 mg total) by mouth daily.  . clopidogrel (PLAVIX) 75 MG tablet Take 1 tablet (75 mg total) by mouth daily.  . fluconazole (DIFLUCAN) 150 MG tablet Take 1 tablet (150 mg total) by mouth daily.  Marland Kitchen glucose blood (ACCU-CHEK SMARTVIEW) test strip TEST three times a day as directed Dx code-E11.65  . lisinopril (PRINIVIL,ZESTRIL) 10 MG tablet Take 1 tablet (10 mg total) by mouth daily.  . metFORMIN (GLUCOPHAGE-XR) 500 MG 24 hr tablet Take 4 tablets (2,000 mg total) by mouth daily with supper.  . metoprolol tartrate (LOPRESSOR) 25 MG tablet take 1/2 tablets by mouth  twice a day.  . nitroGLYCERIN (NITROSTAT) 0.4 MG SL tablet Place 1 tablet (0.4 mg total) under the tongue every 5 (five) minutes as needed for chest pain.  . ranitidine (ZANTAC) 75 MG tablet Take 75 mg by mouth 2 (two) times daily.   No Known Allergies Past Medical History:  Diagnosis Date  . CAD (coronary artery disease)    a. 04/2013 Inf STEMI/PCI: LM nl, LAD min irregs, D1/2/3 nl, LCX 28m (2.5x18 Xience DES), OM1/2/3 nl, RCA nl, PDA nl, RPAV nl, RPL nl, EF 55%;  b. 04/2013 Echo: EF 55-60%, no rwma, mild MR, mildly dil LA.  . Diabetes mellitus type 2, uncontrolled ([place])    a. 04/2013 HbA1c = 11.7.  . Hyperlipidemia   . Hypertension    Family History  Problem Relation Age of Onset  . Heart disease Mother   . Diabetes Mother   . Hypertension Mother   . Heart disease Father   . Diabetes Father   . Hypertension Father   . Diabetes Brother    Past Surgical History:  Procedure Laterality Date  . CESAREAN SECTION    . LEFT HEART CATH  05-15-13  . LEFT HEART CATHETERIZATION WITH CORONARY ANGIOGRAM N/A 05/15/2013   Procedure: LEFT HEART CATHETERIZATION WITH CORONARY ANGIOGRAM;  Surgeon: Wellington Hampshire, MD;  Location: [facility];  Service: Cardiovascular;  Laterality:  N/A;  . PERCUTANEOUS STENT INTERVENTION  05/15/2013   Procedure: PERCUTANEOUS STENT INTERVENTION;  Surgeon: Wellington Hampshire, MD;  Location: [facility];  Service: Cardiovascular;;   Social History   Social History  . Marital status: Widowed    Spouse name: N/A  . Number of children: N/A  . Years of education: N/A   Occupational History  . Not on file.   Social History Main Topics  . Smoking status: Never Smoker  . Smokeless tobacco: Never Used  . Alcohol use No  . Drug use: No  . Sexual activity: Not on file   Other Topics Concern  . Not on file   Social History Narrative  . No narrative on file     Review of Systems: General: negative for chills, fever, night sweats or weight changes.    Cardiovascular: negative for chest pain, dyspnea on exertion, edema, orthopnea, palpitations, paroxysmal nocturnal dyspnea or shortness of breath Dermatological: negative for rash Respiratory: negative for cough or wheezing Urologic: negative for hematuria Abdominal: negative for nausea, vomiting, diarrhea, bright red blood per rectum, melena, or hematemesis Neurologic: negative for visual changes, syncope, or dizziness All other systems reviewed and are otherwise negative except as noted above.   Physical Exam:  Blood pressure 118/68, pulse 76, height 5' (1.524 m), weight 131 lb 12.8 oz (59.8 kg).  General appearance: alert, cooperative and no distress Neck: no carotid bruit and no JVD Lungs: clear to auscultation bilaterally Heart: regular rate and rhythm, S1, S2 normal, no murmur, click, rub or gallop Extremities: extremities normal, atraumatic, no cyanosis or edema Pulses: 2+ and symmetric Skin: Skin color, texture, turgor normal. No rashes or lesions Neurologic: Grossly normal  EKG NSR with nonspecific Twave abnormality, unchanged from previous-- personally reviewed   ASSESSMENT AND PLAN:   1. CAD: inferior STEMI in 2014 with PCI + DES to Lcx. Single vessel CAD. EF was normal. She is stable w/o anginal symptoms. She walks daily for exercise. No exertional CP or dyspnea. No EKG changes. VSS. Continue ASA, Plavix, Lopressor and Lipitor.   2. HTN: controlled on current regimen.   3. T2DM: followed by PCP. Recent Hgb A1c was 7.3.   4. Lipids: most recent FLP 07/31/16 showed controlled LDL at 42 mg/dL.    Follow-Up: pt's son is requesting that she been seen again for f/u prior to her 4 month trip to [place] in November. We will arrange f/u with Dr. Marlou Porch.   Brittainy Ladoris Gene, MHS [facility] 02/20/2017 1:51 PM

## 2017-02-26 ENCOUNTER — Other Ambulatory Visit: Payer: Self-pay | Admitting: Endocrinology

## 2017-02-26 ENCOUNTER — Other Ambulatory Visit: Payer: Self-pay | Admitting: Physician Assistant

## 2017-04-06 ENCOUNTER — Other Ambulatory Visit: Payer: Self-pay | Admitting: Endocrinology

## 2017-04-06 ENCOUNTER — Other Ambulatory Visit: Payer: Self-pay | Admitting: Physician Assistant

## 2017-04-06 DIAGNOSIS — I10 Essential (primary) hypertension: Secondary | ICD-10-CM

## 2017-04-21 ENCOUNTER — Ambulatory Visit (INDEPENDENT_AMBULATORY_CARE_PROVIDER_SITE_OTHER): Payer: Medicaid Other | Admitting: Nurse Practitioner

## 2017-04-21 ENCOUNTER — Other Ambulatory Visit: Payer: Self-pay | Admitting: *Deleted

## 2017-04-21 ENCOUNTER — Encounter: Payer: Self-pay | Admitting: Nurse Practitioner

## 2017-04-21 ENCOUNTER — Encounter (INDEPENDENT_AMBULATORY_CARE_PROVIDER_SITE_OTHER): Payer: Self-pay

## 2017-04-21 VITALS — BP 118/80 | HR 69 | Ht 59.0 in | Wt 134.4 lb

## 2017-04-21 DIAGNOSIS — R710 Precipitous drop in hematocrit: Secondary | ICD-10-CM

## 2017-04-21 DIAGNOSIS — E785 Hyperlipidemia, unspecified: Secondary | ICD-10-CM

## 2017-04-21 DIAGNOSIS — I10 Essential (primary) hypertension: Secondary | ICD-10-CM

## 2017-04-21 DIAGNOSIS — I2510 Atherosclerotic heart disease of native coronary artery without angina pectoris: Secondary | ICD-10-CM | POA: Diagnosis not present

## 2017-04-21 LAB — HEPATIC FUNCTION PANEL
ALT: 9 IU/L (ref 0–32)
AST: 14 IU/L (ref 0–40)
Albumin: 4.6 g/dL (ref 3.6–4.8)
Alkaline Phosphatase: 49 IU/L (ref 39–117)
Bilirubin Total: 0.4 mg/dL (ref 0.0–1.2)
Bilirubin, Direct: 0.16 mg/dL (ref 0.00–0.40)
Total Protein: 7.1 g/dL (ref 6.0–8.5)

## 2017-04-21 LAB — BASIC METABOLIC PANEL
BUN/Creatinine Ratio: 14 (ref 12–28)
BUN: 11 mg/dL (ref 8–27)
CO2: 22 mmol/L (ref 20–29)
Calcium: 10.6 mg/dL — ABNORMAL HIGH (ref 8.7–10.3)
Chloride: 100 mmol/L (ref 96–106)
Creatinine, Ser: 0.81 mg/dL (ref 0.57–1.00)
GFR calc Af Amer: 90 mL/min/{1.73_m2} (ref >59)
GFR calc non Af Amer: 78 mL/min/{1.73_m2} (ref >59)
Glucose: 130 mg/dL — ABNORMAL HIGH (ref 65–99)
Potassium: 4.5 mmol/L (ref 3.5–5.2)
Sodium: 139 mmol/L (ref 134–144)

## 2017-04-21 LAB — LIPID PANEL
Chol/HDL Ratio: 2.6 ratio (ref 0.0–4.4)
Cholesterol, Total: 91 mg/dL — ABNORMAL LOW (ref 100–199)
HDL: 35 mg/dL — ABNORMAL LOW (ref >39)
LDL Calculated: 39 mg/dL (ref 0–99)
Triglycerides: 85 mg/dL (ref 0–149)
VLDL Cholesterol Cal: 17 mg/dL (ref 5–40)

## 2017-04-21 LAB — CBC
Hematocrit: 32.3 % — ABNORMAL LOW (ref 34.0–46.6)
Hemoglobin: 10.2 g/dL — ABNORMAL LOW (ref 11.1–15.9)
MCH: 23.9 pg — ABNORMAL LOW (ref 26.6–33.0)
MCHC: 31.6 g/dL (ref 31.5–35.7)
MCV: 76 fL — ABNORMAL LOW (ref 79–97)
Platelets: 353 10*3/uL (ref 150–379)
RBC: 4.27 x10E6/uL (ref 3.77–5.28)
RDW: 15.8 % — ABNORMAL HIGH (ref 12.3–15.4)
WBC: 7.5 10*3/uL (ref 3.4–10.8)

## 2017-04-21 MED ORDER — LISINOPRIL 10 MG PO TABS: 10.0000 mg | ORAL_TABLET | Freq: Every day | ORAL | 3 refills | Status: DC

## 2017-04-21 MED ORDER — ATORVASTATIN CALCIUM 80 MG PO TABS: ORAL_TABLET | ORAL | 3 refills | Status: DC

## 2017-04-21 MED ORDER — CLOPIDOGREL BISULFATE 75 MG PO TABS: 75.0000 mg | ORAL_TABLET | Freq: Every day | ORAL | 3 refills | Status: DC

## 2017-04-21 MED ORDER — NITROGLYCERIN 0.4 MG SL SUBL: 0.4000 mg | SUBLINGUAL_TABLET | SUBLINGUAL | 3 refills | Status: DC | PRN

## 2017-04-21 MED ORDER — METOPROLOL TARTRATE 25 MG PO TABS: 12.5000 mg | ORAL_TABLET | Freq: Two times a day (BID) | ORAL | 3 refills | Status: DC

## 2017-04-21 NOTE — Progress Notes (Signed)
CARDIOLOGY OFFICE NOTE  Date:  04/21/2017    [name] [name] Date of Birth: [birth year] Medical Record #[id number]  PCP:  Patient, No Pcp Per  Cardiologist:  Aundra Dubin - to transition to [facility] (has not seen)  Chief Complaint  Patient presents with  . Coronary Artery Disease    Follow up visit - former patient of Dr. Claris Gladden    History of Present Illness: [name] [name] is a 61 y.o. female who presents today for a follow up visit. Former patient of Dr. Claris Gladden - to transition to Dr. Marlou Porch however has not been seen yet.   She has a history of diabetes, HTN, and CAD s/p DES to [facility] in the setting of inferior STEMI in 04/2013.  EF was preserved. Other PMH includes HTN, T2 DM and HLD.   Seen by Richardson Dopp, PA in 2016, [place], [place] in 2017 and most recently by Lyda Jester, PA in September. She was doing well at her last visit. Requested repeat follow up back here prior to returning to [place].   Comes in today. Here with her daughter who translates. She does not speak English other than "yes or no". Needs medicines refilled and paper scripts as well due to going to [place] and will not be back in the country until the end of January. No longer has a PCP but does seen endocrine. No chest pain. Breathing is ok. She says she stays active and walks without issue. Not dizzy or lightheaded. No syncope. Overall, no real concerns noted.   Past Medical History:  Diagnosis Date  . CAD (coronary artery disease)    a. 04/2013 Inf STEMI/PCI: LM nl, LAD min irregs, D1/2/3 nl, LCX 95m (2.5x18 Xience DES), OM1/2/3 nl, RCA nl, PDA nl, RPAV nl, RPL nl, EF 55%;  b. 04/2013 Echo: EF 55-60%, no rwma, mild MR, mildly dil LA.  . Diabetes mellitus type 2, uncontrolled ([place])    a. 04/2013 HbA1c = 11.7.  . Hyperlipidemia   . Hypertension     Past Surgical History:  Procedure Laterality Date  . CESAREAN SECTION    . LEFT HEART CATH  05-15-13     Medications: Current Meds  Medication Sig   . acarbose (PRECOSE) 25 MG tablet take 1 tablet by mouth AT BREAKFAST then take 2 tablets by mouth AT LUNCH and 1 tablet by mouth AT DINNER  . aspirin EC 81 MG tablet Take 1 tablet (81 mg total) by mouth daily.  Marland Kitchen atorvastatin (LIPITOR) 80 MG tablet TAKE ONE TABLET BY MOUTH ONCE DAILY AT 6 PM  . clopidogrel (PLAVIX) 75 MG tablet Take 1 tablet (75 mg total) daily by mouth.  . fluconazole (DIFLUCAN) 150 MG tablet Take 1 tablet (150 mg total) by mouth daily.  Marland Kitchen glucose blood (ACCU-CHEK SMARTVIEW) test strip TEST three times a day as directed Dx code-E11.65  . INVOKANA 100 MG TABS tablet Take 1 tablet (100 mg total) by mouth daily.  Marland Kitchen lisinopril (PRINIVIL,ZESTRIL) 10 MG tablet Take 1 tablet (10 mg total) daily by mouth.  . metFORMIN (GLUCOPHAGE-XR) 500 MG 24 hr tablet Take 4 tablets (2,000 mg total) by mouth daily with supper.  . metoprolol tartrate (LOPRESSOR) 25 MG tablet Take 0.5 tablets (12.5 mg total) 2 (two) times daily by mouth.  . nitroGLYCERIN (NITROSTAT) 0.4 MG SL tablet Place 1 tablet (0.4 mg total) every 5 (five) minutes as needed under the tongue for chest pain.  . ranitidine (ZANTAC) 75 MG tablet Take 75 mg by mouth  2 (two) times daily.  . [DISCONTINUED] atorvastatin (LIPITOR) 80 MG tablet TAKE ONE TABLET BY MOUTH ONCE DAILY AT 6 PM  . [DISCONTINUED] atorvastatin (LIPITOR) 80 MG tablet TAKE ONE TABLET BY MOUTH ONCE DAILY AT 6 PM  . [DISCONTINUED] clopidogrel (PLAVIX) 75 MG tablet TAKE ONE TABLET BY MOUTH ONCE DAILY  . [DISCONTINUED] clopidogrel (PLAVIX) 75 MG tablet Take 1 tablet (75 mg total) daily by mouth.  . [DISCONTINUED] lisinopril (PRINIVIL,ZESTRIL) 10 MG tablet TAKE ONE TABLET BY MOUTH ONCE DAILY  . [DISCONTINUED] lisinopril (PRINIVIL,ZESTRIL) 10 MG tablet Take 1 tablet (10 mg total) daily by mouth.  . [DISCONTINUED] metoprolol tartrate (LOPRESSOR) 25 MG tablet TAKE 1/2 TABLET BY MOUTH TWICE A DAY  . [DISCONTINUED] metoprolol tartrate (LOPRESSOR) 25 MG tablet Take 0.5 tablets  (12.5 mg total) 2 (two) times daily by mouth.  . [DISCONTINUED] nitroGLYCERIN (NITROSTAT) 0.4 MG SL tablet Place 1 tablet (0.4 mg total) under the tongue every 5 (five) minutes as needed for chest pain.  . [DISCONTINUED] nitroGLYCERIN (NITROSTAT) 0.4 MG SL tablet Place 1 tablet (0.4 mg total) every 5 (five) minutes as needed under the tongue for chest pain.     Allergies: No Known Allergies  Social History: The patient  reports that  has never smoked. she has never used smokeless tobacco. She reports that she does not drink alcohol or use drugs.   Family History: The patient's family history includes Diabetes in her brother, father, and mother; Heart disease in her father and mother; Hypertension in her father and mother.   Review of Systems: Please see the history of present illness.   Otherwise, the review of systems is positive for none.   All other systems are reviewed and negative.   Physical Exam: VS:  BP 118/80 (BP Location: Left Arm, Patient Position: Sitting, Cuff Size: Normal)   Pulse 69   Ht 4\' 11"  (1.499 m)   Wt 134 lb 6.4 oz (61 kg)   SpO2 97%   BMI 27.15 kg/m  .  BMI Body mass index is 27.15 kg/m.  Wt Readings from Last 3 Encounters:  04/21/17 134 lb 6.4 oz (61 kg)  02/20/17 131 lb 12.8 oz (59.8 kg)  02/04/17 133 lb 12.8 oz (60.7 kg)    General: Pleasant. Alert and in no acute distress.  She does not speak [place] - daughter translates.  HEENT: Normal but with missing teeth.  Neck: Supple, no JVD, carotid bruits, or masses noted.  Cardiac: Regular rate and rhythm. No murmurs, rubs, or gallops. No edema.  Respiratory:  Lungs are clear to auscultation bilaterally with normal work of breathing.  GI: Soft and nontender.  MS: No deformity or atrophy. Gait and ROM intact.  Skin: Warm and dry. Color is normal.  Neuro:  Strength and sensation are intact and no gross focal deficits noted.  Psych: Alert, appropriate and with normal affect.   LABORATORY DATA:  EKG:   EKG is not ordered today.  Lab Results  Component Value Date   WBC 6.1 08/03/2014   HGB 12.7 08/03/2014   HCT 37.3 08/03/2014   PLT 254.0 08/03/2014   GLUCOSE 130 (H) 01/30/2017   CHOL 95 07/31/2016   TRIG 83.0 07/31/2016   HDL 36.00 (L) 07/31/2016   LDLCALC 42 07/31/2016   ALT 13 10/31/2016   AST 13 10/31/2016   NA 141 01/30/2017   K 4.7 01/30/2017   CL 107 01/30/2017   CREATININE 0.71 01/30/2017   BUN 10 01/30/2017   CO2 26 01/30/2017  HGBA1C 7.3 (H) 01/30/2017   MICROALBUR <0.7 07/31/2016     BNP (last 3 results) No results for input(s): BNP in the last 8760 hours.  ProBNP (last 3 results) No results for input(s): PROBNP in the last 8760 hours.   Other Studies Reviewed Today:  Echo Study Conclusions from 2014  - Left ventricle: The cavity size was normal. Wall thickness was increased in a pattern of mild LVH. Systolic function was normal. The estimated ejection fraction was in the range of 55% to 60%. Wall motion was normal; there were no regional wall motion abnormalities. - Mitral valve: Mild regurgitation. - Left atrium: The atrium was mildly dilated. - Atrial septum: Redundant but no obvious PFO/ASD No defect or patent foramen ovale was identified.   Coronary angiography 2014: Coronary dominance: Right   Left Main:  Normal   Left Anterior Descending (LAD):  Normal in size with minor irregularities.  1st diagonal (D1):  Normal in size with no significant disease.  2nd diagonal (D2):  Medium in size with no significant disease.  3rd diagonal (D3):  Small in size with no significant disease.  Circumflex (LCx):  Normal in size with minor irregularities proximally. There is a 99% hazy stenosis in the midsegment which is likely the culprit for inferior ST elevation MI. The stenosis is at the origin of OM 2.  1st obtuse marginal:  Small in size with minor irregularities.  2nd obtuse marginal:  Normal in size with minor  irregularities.  3rd obtuse marginal:  Normal in size with no significant disease.       Right Coronary Artery: normal in size with no significant disease.  Posterior descending artery: Normal  Posterior AV segment: Small in size with no significant disease.  Posterolateral branchs:  Small and normal posterolateral branches.  Left ventriculography: Left ventricular systolic function is normal  , LVEF is estimated at 55 %, there is no significant mitral regurgitation   PCI Note:  Following the diagnostic procedure, the decision was made to proceed with PCI.  Weight-based bivalirudin was given for anticoagulation. Once a therapeutic ACT was achieved, a 6 [place] XB 3.0 guide catheter was inserted.  A intuition coronary guidewire was used to cross the lesion.  The lesion was predilated with a 2.5 x 12 balloon.  The lesion was then stented with a 2.5 x 18 mm Xience expedition drug-eluting stent. There was a significant vessel mismatch between the proximal and the distal segment of the stent. The stent was postdilated with a 3.0 x 12 noncompliant balloon.  Following PCI, there was 0% residual stenosis and TIMI-3 flow. Final angiography confirmed an excellent result. The patient tolerated the procedure well. There were no immediate procedural complications. A TR band was used for radial hemostasis. The patient was transferred to the post catheterization recovery area for further monitoring.  PCI Data: Vessel - left circumflex artery/Segment - mid Percent Stenosis (pre)  99% TIMI-flow 3 Stent : 2.5 x 18 mm Xience expedition drug-eluting stent postdilated with a 3.0 noncompliant balloon in the mid and proximal segment Percent Stenosis (post) 0% TIMI-flow (post) 3   Final Conclusions:  1. Severe one-vessel coronary artery disease with 99% hazy mid left circumflex stenosis which is the culprit for inferior ST elevation MI. 2. Normal LV systolic function and mildly elevated left ventricular  end-diastolic pressure. 3. Successful angioplasty and drug-eluting stent placement to the left circumflex artery.  Recommendations:  Continue dual antiplatelet therapy for at least one year. Aggressive treatment of risk factors is  recommended.  Kathlyn Sacramento MD, [facility] 05/15/2013, 2:51 AM   Assessment/Plan:  1. CAD: inferior STEMI in 2014 with PCI + DES to Lcx. Single vessel CAD. EF was normal. She continues to do well with her current regimen. No symptoms noted. Medicines refilled today electronically and given paper scripts due to upcoming travel. See back in 6 months - I will follow.   2. HTN: BP looks good on her current regimen - no changes made today.   3. T2DM: on therapy - sees Endocrine  4. Lipids: on statin - no PCP - lab today   Current medicines are reviewed with the patient today.  The patient does not have concerns regarding medicines other than what has been noted above.  The following changes have been made:  See above.  Labs/ tests ordered today include:   No orders of the defined types were placed in this encounter.    Disposition:   FU with me in 6 months with EKG and fasting labs.   Patient is agreeable to this plan and will call if any problems develop in the interim.   SignedTruitt Merle, NP  04/21/2017 8:27 AM  [facility] [address] [place], [place]  [address] Phone: [phone] Fax: [phone]

## 2017-04-21 NOTE — Patient Instructions (Addendum)
We will be checking the following labs today - BMET, CBC, HPF and lipids   Medication Instructions:    Continue with your current medicines.   I sent in your refills today and will be giving you paper scripts as well.     Testing/Procedures To Be Arranged:  N/A  Follow-Up:   See me in 6 months with fasting labs and EKG.     Other Special Instructions:   N/A    If you need a refill on your cardiac medications before your next appointment, please call your pharmacy.   Call the [place] office at [phone] if you have any questions, problems or concerns.

## 2017-04-22 ENCOUNTER — Encounter: Payer: Self-pay | Admitting: *Deleted

## 2017-04-28 ENCOUNTER — Other Ambulatory Visit (INDEPENDENT_AMBULATORY_CARE_PROVIDER_SITE_OTHER): Payer: Medicaid Other

## 2017-04-28 DIAGNOSIS — G5603 Carpal tunnel syndrome, bilateral upper limbs: Secondary | ICD-10-CM

## 2017-04-28 DIAGNOSIS — E1165 Type 2 diabetes mellitus with hyperglycemia: Secondary | ICD-10-CM | POA: Diagnosis not present

## 2017-04-28 LAB — COMPREHENSIVE METABOLIC PANEL
ALK PHOS: 35 U/L — AB (ref 39–117)
ALT: 10 U/L (ref 0–35)
AST: 13 U/L (ref 0–37)
Albumin: 4 g/dL (ref 3.5–5.2)
BUN: 11 mg/dL (ref 6–23)
CO2: 26 mEq/L (ref 19–32)
CREATININE: 0.64 mg/dL (ref 0.40–1.20)
Calcium: 9.5 mg/dL (ref 8.4–10.5)
Chloride: 105 mEq/L (ref 96–112)
GFR: 99.62 mL/min (ref >60.00)
Glucose, Bld: 111 mg/dL — ABNORMAL HIGH (ref 70–99)
Potassium: 4 mEq/L (ref 3.5–5.1)
SODIUM: 139 meq/L (ref 135–145)
Total Bilirubin: 0.4 mg/dL (ref 0.2–1.2)
Total Protein: 6.9 g/dL (ref 6.0–8.3)

## 2017-04-28 LAB — TSH: TSH: 5.53 u[IU]/mL — AB (ref 0.35–4.50)

## 2017-04-28 LAB — HEMOGLOBIN A1C: Hgb A1c MFr Bld: 7 % — ABNORMAL HIGH (ref 4.6–6.5)

## 2017-04-30 ENCOUNTER — Encounter: Payer: Self-pay | Admitting: Endocrinology

## 2017-04-30 ENCOUNTER — Ambulatory Visit (INDEPENDENT_AMBULATORY_CARE_PROVIDER_SITE_OTHER): Payer: Medicaid Other | Admitting: Endocrinology

## 2017-04-30 ENCOUNTER — Other Ambulatory Visit: Payer: Self-pay

## 2017-04-30 VITALS — BP 124/76 | HR 74 | Ht 59.0 in | Wt 134.4 lb

## 2017-04-30 DIAGNOSIS — Z23 Encounter for immunization: Secondary | ICD-10-CM | POA: Diagnosis not present

## 2017-04-30 DIAGNOSIS — E1165 Type 2 diabetes mellitus with hyperglycemia: Secondary | ICD-10-CM | POA: Diagnosis not present

## 2017-04-30 MED ORDER — LEVOTHYROXINE SODIUM 25 MCG PO TABS: 25.0000 ug | ORAL_TABLET | Freq: Every day | ORAL | 3 refills | Status: DC

## 2017-04-30 MED ORDER — FLUCONAZOLE 150 MG PO TABS: 150.0000 mg | ORAL_TABLET | Freq: Every day | ORAL | 1 refills | Status: DC

## 2017-04-30 MED ORDER — METFORMIN HCL ER 500 MG PO TB24: 2000.0000 mg | ORAL_TABLET | Freq: Every day | ORAL | 3 refills | Status: DC

## 2017-04-30 MED ORDER — ACARBOSE 25 MG PO TABS: ORAL_TABLET | ORAL | 3 refills | Status: DC

## 2017-04-30 MED ORDER — GLUCOSE BLOOD VI STRP: ORAL_STRIP | 1 refills | Status: DC

## 2017-04-30 MED ORDER — CANAGLIFLOZIN 100 MG PO TABS: 100.0000 mg | ORAL_TABLET | Freq: Every day | ORAL | 1 refills | Status: DC

## 2017-04-30 NOTE — Progress Notes (Signed)
Patient ID: [name] [name], female   DOB: 12-16-1955, 61 y.o.   MRN: [id number]           Reason for Appointment: Follow-up for Type 2 Diabetes   History of Present Illness:          Diagnosis: Type 2 diabetes mellitus, date of diagnosis: ?  2014   Past history:   Her son thinks that she may have had diabetes diagnosed when sh 0e was in [place] When she moved to [place] in 2014 she was not on any medications She was however found to have significant hyperglycemia with glucose 364 and A1c of 11.7 when she was admitted for her coronary artery disease in 04/2013. At that time she was started on premixed insulin twice a day Her level of control had not been adequate with persistently high A1c readings She did not tolerate Victoza well and was having decreased appetite, malaise and nausea and this was stopped subsequently  Recent history:        Oral hypoglycemic drugs:     Metformin ER 500 mg 4 tablets daily, Invokana 100 mg daily, Precose 25 mg before meals   Her A1c has been over 7% and now is back to 7% Blood sugars are usually lower than expected for her A1c  Current blood sugar patterns and problems:  She has not changed any of her medications, diet or exercise level and appears to have gained 3 pounds  This is despite continuing Invokana  However A1c appears to be better than usual, likely has less often high readings after meals  She has been advised to add protein to each meal in the past and take Precose consistently before each meal  Her blood sugars are fairly consistent throughout the day and has a couple of high readings over 180 after lunch or once after dinner or at night  Fasting readings are just above 100 usually  No side effects from any medications, does have Diflucan to use as needed for candidiasis  Hypoglycemia: None    Side effects from medications have been: None  Self-care: The diet that the patient has been following is: None, has  vegetarian diet Usually having vegetables and roti with lunch and dinner and sometimes having lentils.   She does not like yogurt as it apparently causes heartburn  Usually not eating fried food except for some snacks            Exercise:  walking almost daily either indoors or outdoors up to one hour         Dietician visit, none  Compliance with the medical regimen: Fair  Glucose monitoring:  done about 1 times a day         Glucometer: Accu-Chek  Blood Glucose readings by meter download    Mean values apply above for all meters except median for One Touch  PRE-MEAL Fasting Lunch Dinner Bedtime Overall  Glucose range:  95-1 18    90-1 80    Mean/median: 107    131  126    POST-MEAL PC Breakfast PC Lunch PC Dinner  Glucose range:  118-189    Mean/median:  131       Weight history:   Wt Readings from Last 3 Encounters:  04/30/17 134 lb 6.4 oz (61 kg)  04/21/17 134 lb 6.4 oz (61 kg)  02/20/17 131 lb 12.8 oz (59.8 kg)    Glycemic control:   Lab Results  Component Value Date   HGBA1C 7.0 (  H) 04/28/2017   HGBA1C 7.3 (H) 01/30/2017   HGBA1C 7.7 (H) 10/31/2016   Lab Results  Component Value Date   MICROALBUR <0.7 07/31/2016   LDLCALC 39 04/21/2017   CREATININE 0.64 04/28/2017    Lab on 04/28/2017  Component Date Value Ref Range Status  . TSH 04/28/2017 5.53* 0.35 - 4.50 uIU/mL Final  . Sodium 04/28/2017 139  135 - 145 mEq/L Final  . Potassium 04/28/2017 4.0  3.5 - 5.1 mEq/L Final  . Chloride 04/28/2017 105  96 - 112 mEq/L Final  . CO2 04/28/2017 26  19 - 32 mEq/L Final  . Glucose, Bld 04/28/2017 111* 70 - 99 mg/dL Final  . BUN 04/28/2017 11  6 - 23 mg/dL Final  . Creatinine, Ser 04/28/2017 0.64  0.40 - 1.20 mg/dL Final  . Total Bilirubin 04/28/2017 0.4  0.2 - 1.2 mg/dL Final  . Alkaline Phosphatase 04/28/2017 35* 39 - 117 U/L Final  . AST 04/28/2017 13  0 - 37 U/L Final  . ALT 04/28/2017 10  0 - 35 U/L Final  . Total Protein 04/28/2017 6.9  6.0 - 8.3 g/dL  Final  . Albumin 04/28/2017 4.0  3.5 - 5.2 g/dL Final  . Calcium 04/28/2017 9.5  8.4 - 10.5 mg/dL Final  . GFR 04/28/2017 99.62  >60.00 mL/min Final  . Hgb A1c MFr Bld 04/28/2017 7.0* 4.6 - 6.5 % Final   Glycemic Control Guidelines for People with Diabetes:Non Diabetic:  <6%Goal of Therapy: <7%Additional Action Suggested:  >8%       Allergies as of 04/30/2017   No Known Allergies     Medication List        Accurate as of 04/30/17  4:01 PM. Always use your most recent med list.          acarbose 25 MG tablet Commonly known as:  PRECOSE take 1 tablet by mouth AT BREAKFAST then take 2 tablets by mouth AT LUNCH and 1 tablet by mouth AT DINNER   aspirin EC 81 MG tablet Take 1 tablet (81 mg total) by mouth daily.   atorvastatin 80 MG tablet Commonly known as:  LIPITOR TAKE ONE TABLET BY MOUTH ONCE DAILY AT 6 PM   clopidogrel 75 MG tablet Commonly known as:  PLAVIX Take 1 tablet (75 mg total) daily by mouth.   fluconazole 150 MG tablet Commonly known as:  DIFLUCAN Take 1 tablet (150 mg total) by mouth daily.   glucose blood test strip Commonly known as:  ACCU-CHEK SMARTVIEW TEST three times a day as directed Dx code-E11.65   INVOKANA 100 MG Tabs tablet Generic drug:  canagliflozin Take 1 tablet (100 mg total) by mouth daily.   lisinopril 10 MG tablet Commonly known as:  PRINIVIL,ZESTRIL Take 1 tablet (10 mg total) daily by mouth.   metFORMIN 500 MG 24 hr tablet Commonly known as:  GLUCOPHAGE-XR Take 4 tablets (2,000 mg total) by mouth daily with supper.   metoprolol tartrate 25 MG tablet Commonly known as:  LOPRESSOR Take 0.5 tablets (12.5 mg total) 2 (two) times daily by mouth.   nitroGLYCERIN 0.4 MG SL tablet Commonly known as:  NITROSTAT Place 1 tablet (0.4 mg total) every 5 (five) minutes as needed under the tongue for chest pain.   ranitidine 75 MG tablet Commonly known as:  ZANTAC Take 75 mg by mouth 2 (two) times daily.       Allergies: No Known  Allergies  Past Medical History:  Diagnosis Date  . CAD (coronary artery  disease)    a. 04/2013 Inf STEMI/PCI: LM nl, LAD min irregs, D1/2/3 nl, LCX 50m (2.5x18 Xience DES), OM1/2/3 nl, RCA nl, PDA nl, RPAV nl, RPL nl, EF 55%;  b. 04/2013 Echo: EF 55-60%, no rwma, mild MR, mildly dil LA.  . Diabetes mellitus type 2, uncontrolled ([place])    a. 04/2013 HbA1c = 11.7.  . Hyperlipidemia   . Hypertension     Past Surgical History:  Procedure Laterality Date  . CESAREAN SECTION    . LEFT HEART CATH  05-15-13  . LEFT HEART CATHETERIZATION WITH CORONARY ANGIOGRAM N/A 05/15/2013   Procedure: LEFT HEART CATHETERIZATION WITH CORONARY ANGIOGRAM;  Surgeon: Wellington Hampshire, MD;  Location: [facility] CATH LAB;  Service: Cardiovascular;  Laterality: N/A;  . PERCUTANEOUS STENT INTERVENTION  05/15/2013   Procedure: PERCUTANEOUS STENT INTERVENTION;  Surgeon: Wellington Hampshire, MD;  Location: [facility] CATH LAB;  Service: Cardiovascular;;    Family History  Problem Relation Age of Onset  . Heart disease Mother   . Diabetes Mother   . Hypertension Mother   . Heart disease Father   . Diabetes Father   . Hypertension Father   . Diabetes Brother     Social History:  reports that  has never smoked. she has never used smokeless tobacco. She reports that she does not drink alcohol or use drugs.    Review of Systems   Screening TSH has been done because of her complaints of carpal tunnel syndrome on the last visit She does feel tired at times but nothing new or consistent No cold intolerance, dry skin or unusual constipation She has gained a little weight since her last visit   Lab Results  Component Value Date   TSH 5.53 (H) 04/28/2017      She recently has only mild intermittent symptoms of numbness in her hands compared to her last visit   Lipids: She Has been on Lipitor 80 mg for quite some time.  Lipids are well controlled, has history of CAD      Lab Results  Component Value Date   CHOL 91 (L)  04/21/2017   HDL 35 (L) 04/21/2017   LDLCALC 39 04/21/2017   TRIG 85 04/21/2017   CHOLHDL 2.6 04/21/2017                  The blood pressure has been controlled with lisinopril     LABS:  Lab on 04/28/2017  Component Date Value Ref Range Status  . TSH 04/28/2017 5.53* 0.35 - 4.50 uIU/mL Final  . Sodium 04/28/2017 139  135 - 145 mEq/L Final  . Potassium 04/28/2017 4.0  3.5 - 5.1 mEq/L Final  . Chloride 04/28/2017 105  96 - 112 mEq/L Final  . CO2 04/28/2017 26  19 - 32 mEq/L Final  . Glucose, Bld 04/28/2017 111* 70 - 99 mg/dL Final  . BUN 04/28/2017 11  6 - 23 mg/dL Final  . Creatinine, Ser 04/28/2017 0.64  0.40 - 1.20 mg/dL Final  . Total Bilirubin 04/28/2017 0.4  0.2 - 1.2 mg/dL Final  . Alkaline Phosphatase 04/28/2017 35* 39 - 117 U/L Final  . AST 04/28/2017 13  0 - 37 U/L Final  . ALT 04/28/2017 10  0 - 35 U/L Final  . Total Protein 04/28/2017 6.9  6.0 - 8.3 g/dL Final  . Albumin 04/28/2017 4.0  3.5 - 5.2 g/dL Final  . Calcium 04/28/2017 9.5  8.4 - 10.5 mg/dL Final  . GFR 04/28/2017 99.62  >60.00 mL/min  Final  . Hgb A1c MFr Bld 04/28/2017 7.0* 4.6 - 6.5 % Final   Glycemic Control Guidelines for People with Diabetes:Non Diabetic:  <6%Goal of Therapy: <7%Additional Action Suggested:  >8%     Physical Examination:  BP 124/76   Pulse 74   Ht 4\' 11"  (1.499 m)   Wt 134 lb 6.4 oz (61 kg)   SpO2 96%   BMI 27.15 kg/m    Thyroid nonpalpable Biceps reflexes appear normal    ASSESSMENT:  Diabetes type 2, uncontrolled with obesity  See history of present illness for detailed discussion of current diabetes management, blood sugar patterns and problems identified  A1c is improving this year and now down to 7% with continued use of Invokana However has gained a little weight She probably has a little better diet since postprandial readings are not as high and she is doing well with monitoring at various times Also trying to walk as much is possible  CARPAL tunnel  syndrome: Less symptomatic now  Hypothyroidism: She has a mild increase in TSH although has only nonspecific symptoms of occasional fatigue and recent weight gain Also has probable mild carpal tunnel syndrome  HYPERTENSION: Well controlled   PLAN:  She will be given a trial of levothyroxine 25 g daily No change in other medications  There are no Patient Instructions on file for this visit.  Luanne Krzyzanowski 04/30/2017, 4:01 PM    Note: This office note was prepared with Dragon voice recognition system technology. Any transcriptional errors that result from this process are unintentional.

## 2017-04-30 NOTE — Patient Instructions (Signed)
Check blood sugars on waking up  2/7  Also check blood sugars about 2 hours after a meal and do this after different meals by rotation  Recommended blood sugar levels on waking up is 90-130 and about 2 hours after meal is 130-160  Please bring your blood sugar monitor to each visit, thank you  

## 2017-05-01 ENCOUNTER — Telehealth: Payer: Self-pay | Admitting: *Deleted

## 2017-05-01 DIAGNOSIS — Z23 Encounter for immunization: Secondary | ICD-10-CM | POA: Diagnosis not present

## 2017-05-01 NOTE — Telephone Encounter (Signed)
Patient son called and states a RX for test strips ( Accu Chek Smartview) test strip was sent to [facility] aid and it needs a prior approval form Medicaid before it can be filled. Please advise. Thank you

## 2017-05-01 NOTE — Telephone Encounter (Signed)
Called patients son and let him know that I have tried to call the pharmacy and not been able to get in touch with anyone. I will try again when I get a chance. I also let him know that he can contact the pharmacy and see what Medicaid covers if they do not still cover the Accu-chek strips and to call us and let us know.

## 2017-05-01 NOTE — Telephone Encounter (Signed)
Please check with [facility] to see what they want, Medicaid will cover Accu-Chek normally

## 2017-05-19 ENCOUNTER — Other Ambulatory Visit: Payer: Self-pay | Admitting: Endocrinology

## 2017-07-28 ENCOUNTER — Other Ambulatory Visit: Payer: Medicaid Other

## 2017-07-30 ENCOUNTER — Ambulatory Visit: Payer: Medicaid Other | Admitting: Endocrinology

## 2017-08-17 ENCOUNTER — Other Ambulatory Visit: Payer: Self-pay | Admitting: Endocrinology

## 2017-09-03 ENCOUNTER — Other Ambulatory Visit (INDEPENDENT_AMBULATORY_CARE_PROVIDER_SITE_OTHER): Payer: Medicaid Other

## 2017-09-03 DIAGNOSIS — E1165 Type 2 diabetes mellitus with hyperglycemia: Secondary | ICD-10-CM | POA: Diagnosis not present

## 2017-09-03 LAB — URINALYSIS, ROUTINE W REFLEX MICROSCOPIC
Bilirubin Urine: NEGATIVE
KETONES UR: NEGATIVE
LEUKOCYTES UA: NEGATIVE
Nitrite: NEGATIVE
Specific Gravity, Urine: 1.02 (ref 1.000–1.030)
TOTAL PROTEIN, URINE-UPE24: NEGATIVE
Urine Glucose: >=1000 — AB
Urobilinogen, UA: 0.2 (ref 0.0–1.0)
WBC, UA: NONE SEEN (ref 0–?)
pH: 5.5 (ref 5.0–8.0)

## 2017-09-03 LAB — COMPREHENSIVE METABOLIC PANEL
ALK PHOS: 55 U/L (ref 39–117)
ALT: 8 U/L (ref 0–35)
AST: 11 U/L (ref 0–37)
Albumin: 4.4 g/dL (ref 3.5–5.2)
BUN: 11 mg/dL (ref 6–23)
CO2: 25 meq/L (ref 19–32)
Calcium: 10.3 mg/dL (ref 8.4–10.5)
Chloride: 103 mEq/L (ref 96–112)
Creatinine, Ser: 0.73 mg/dL (ref 0.40–1.20)
GFR: 85.49 mL/min (ref >60.00)
GLUCOSE: 131 mg/dL — AB (ref 70–99)
Potassium: 4.7 mEq/L (ref 3.5–5.1)
SODIUM: 138 meq/L (ref 135–145)
TOTAL PROTEIN: 7.2 g/dL (ref 6.0–8.3)
Total Bilirubin: 0.2 mg/dL (ref 0.2–1.2)

## 2017-09-03 LAB — MICROALBUMIN / CREATININE URINE RATIO
Creatinine,U: 73 mg/dL
Microalb Creat Ratio: 1 mg/g (ref 0.0–30.0)
Microalb, Ur: <0.7 mg/dL (ref 0.0–1.9)

## 2017-09-03 LAB — TSH: TSH: 5.27 u[IU]/mL — AB (ref 0.35–4.50)

## 2017-09-03 LAB — T4, FREE: FREE T4: 0.68 ng/dL (ref 0.60–1.60)

## 2017-09-03 LAB — HEMOGLOBIN A1C: Hgb A1c MFr Bld: 7.4 % — ABNORMAL HIGH (ref 4.6–6.5)

## 2017-09-08 ENCOUNTER — Encounter: Payer: Self-pay | Admitting: Endocrinology

## 2017-09-08 ENCOUNTER — Ambulatory Visit (INDEPENDENT_AMBULATORY_CARE_PROVIDER_SITE_OTHER): Payer: Medicaid Other | Admitting: Endocrinology

## 2017-09-08 VITALS — BP 134/80 | HR 79 | Ht 59.0 in | Wt 133.0 lb

## 2017-09-08 DIAGNOSIS — E063 Autoimmune thyroiditis: Secondary | ICD-10-CM

## 2017-09-08 DIAGNOSIS — E1165 Type 2 diabetes mellitus with hyperglycemia: Secondary | ICD-10-CM | POA: Diagnosis not present

## 2017-09-08 NOTE — Patient Instructions (Signed)
Take levothyroxine in am before eating

## 2017-09-08 NOTE — Progress Notes (Signed)
Patient ID: [name] [name], female   DOB: [birth year], 62 y.o.   MRN: [id number]           Reason for Appointment: Follow-up for Type 2 Diabetes   History of Present Illness:          Diagnosis: Type 2 diabetes mellitus, date of diagnosis: ?  2014   Past history:   Her son thinks that she may have had diabetes diagnosed when sh 0e was in [place] When she moved to [place] in 2014 she was not on any medications She was however found to have significant hyperglycemia with glucose 364 and A1c of 11.7 when she was admitted for her coronary artery disease in 04/2013. At that time she was started on premixed insulin twice a day Her level of control had not been adequate with persistently high A1c readings She did not tolerate Victoza well and was having decreased appetite, malaise and nausea and this was stopped subsequently  Recent history:        Oral hypoglycemic drugs:     Metformin ER 500 mg 4 tablets daily, Invokana 100 mg daily, Precose 25 mg before meals   Her A1c has been ranging from 7-7.4 and now relatively higher at 7.4  Blood sugars are usually lower than expected for her A1c  Current blood sugar patterns and problems:  She has not been consistent with her medications over the last few weeks because of some difficulty with coverage and insurance issues  With this her blood sugars have been as high as 208  However she has just started back on her medications and is taking all of them as above including Precose before each meal  Her weight has leveled off  She said that she is only recently starting to get back into her walking [place]  Her monitor has the incorrect time programmed and difficult to know which readings are postprandial, highest blood sugars appear to be after dinner  Fasting readings are starting to improve and now around 110-125 recently  No side effects from [place] does have Diflucan to use as needed for  candidiasis  Hypoglycemia: None    Side effects from medications have been: None  Self-care: The diet that the patient has been following is: None, has vegetarian diet Usually having vegetables and roti with lunch and dinner and sometimes having lentils.   She does not like yogurt as it apparently causes heartburn  Usually not eating fried food except for some snacks            Exercise:  Just starting back         Dietician visit, none  Compliance with the medical regimen: Fair  Glucose monitoring:  done about 1 times a day, less recently       Glucometer: Accu-Chek  Blood Glucose readings by meter download   As above, blood sugars are difficult to interpret because of incorrect date and time   Weight history:   Wt Readings from Last 3 Encounters:  09/08/17 133 lb (60.3 kg)  04/30/17 134 lb 6.4 oz (61 kg)  04/21/17 134 lb 6.4 oz (61 kg)    Glycemic control:   Lab Results  Component Value Date   HGBA1C 7.4 (H) 09/03/2017   HGBA1C 7.0 (H) 04/28/2017   HGBA1C 7.3 (H) 01/30/2017   Lab Results  Component Value Date   MICROALBUR <0.7 09/03/2017   LDLCALC 39 04/21/2017   CREATININE 0.73 09/03/2017    Lab on  09/03/2017  Component Date Value Ref Range Status  . Color, Urine 09/03/2017 YELLOW  Yellow;Lt. Yellow Final  . APPearance 09/03/2017 CLEAR  Clear Final  . Specific Gravity, Urine 09/03/2017 1.020  1.000 - 1.030 Final  . pH 09/03/2017 5.5  5.0 - 8.0 Final  . Total Protein, Urine 09/03/2017 NEGATIVE  Negative Final  . Urine Glucose 09/03/2017 >=1000* Negative Final  . Ketones, ur 09/03/2017 NEGATIVE  Negative Final  . Bilirubin Urine 09/03/2017 NEGATIVE  Negative Final  . Hgb urine dipstick 09/03/2017 SMALL* Negative Final  . Urobilinogen, UA 09/03/2017 0.2  0.0 - 1.0 Final  . Leukocytes, UA 09/03/2017 NEGATIVE  Negative Final  . Nitrite 09/03/2017 NEGATIVE  Negative Final  . WBC, UA 09/03/2017 none seen  0-2/hpf Final  . RBC / HPF 09/03/2017 0-2/hpf  0-2/hpf  Final  . Squamous Epithelial / LPF 09/03/2017 Rare(0-4/hpf)  Rare(0-4/hpf) Final  . Microalb, Ur 09/03/2017 <0.7  0.0 - 1.9 mg/dL Final  . Creatinine,U 09/03/2017 73.0  mg/dL Final  . Microalb Creat Ratio 09/03/2017 1.0  0.0 - 30.0 mg/g Final  . Free T4 09/03/2017 0.68  0.60 - 1.60 ng/dL Final   Comment: Specimens from patients who are undergoing biotin therapy and /or ingesting biotin supplements may contain high levels of biotin.  The higher biotin concentration in these specimens interferes with this Free T4 assay.  Specimens that contain high levels  of biotin may cause false high results for this Free T4 assay.  Please interpret results in light of the total clinical presentation of the patient.    Marland Kitchen TSH 09/03/2017 5.27* 0.35 - 4.50 uIU/mL Final  . Sodium 09/03/2017 138  135 - 145 mEq/L Final  . Potassium 09/03/2017 4.7  3.5 - 5.1 mEq/L Final  . Chloride 09/03/2017 103  96 - 112 mEq/L Final  . CO2 09/03/2017 25  19 - 32 mEq/L Final  . Glucose, Bld 09/03/2017 131* 70 - 99 mg/dL Final  . BUN 09/03/2017 11  6 - 23 mg/dL Final  . Creatinine, Ser 09/03/2017 0.73  0.40 - 1.20 mg/dL Final  . Total Bilirubin 09/03/2017 0.2  0.2 - 1.2 mg/dL Final  . Alkaline Phosphatase 09/03/2017 55  39 - 117 U/L Final  . AST 09/03/2017 11  0 - 37 U/L Final  . ALT 09/03/2017 8  0 - 35 U/L Final  . Total Protein 09/03/2017 7.2  6.0 - 8.3 g/dL Final  . Albumin 09/03/2017 4.4  3.5 - 5.2 g/dL Final  . Calcium 09/03/2017 10.3  8.4 - 10.5 mg/dL Final  . GFR 09/03/2017 85.49  >60.00 mL/min Final  . Hgb A1c MFr Bld 09/03/2017 7.4* 4.6 - 6.5 % Final   Glycemic Control Guidelines for People with Diabetes:Non Diabetic:  <6%Goal of Therapy: <7%Additional Action Suggested:  >8%       Allergies as of 09/08/2017   No Known Allergies     Medication List        Accurate as of 09/08/17  8:55 PM. Always use your most recent med list.          acarbose 25 MG tablet Commonly known as:  PRECOSE take 1 tablet by  mouth AT BREAKFAST then 2 tablets by mouth AT LUNCH and 1 tablet by mouth AT DINNER   aspirin EC 81 MG tablet Take 1 tablet (81 mg total) by mouth daily.   atorvastatin 80 MG tablet Commonly known as:  LIPITOR TAKE ONE TABLET BY MOUTH ONCE DAILY AT 6 PM   clopidogrel  75 MG tablet Commonly known as:  PLAVIX Take 1 tablet (75 mg total) daily by mouth.   fluconazole 150 MG tablet Commonly known as:  DIFLUCAN Take 1 tablet (150 mg total) daily by mouth.   glucose blood test strip Commonly known as:  ACCU-CHEK SMARTVIEW TEST three times a day as directed Dx code-E11.65   INVOKANA 100 MG Tabs tablet Generic drug:  canagliflozin take 1 tablet by mouth daily   levothyroxine 25 MCG tablet Commonly known as:  SYNTHROID Take 1 tablet (25 mcg total) daily before breakfast by mouth.   lisinopril 10 MG tablet Commonly known as:  PRINIVIL,ZESTRIL Take 1 tablet (10 mg total) daily by mouth.   metFORMIN 500 MG 24 hr tablet Commonly known as:  GLUCOPHAGE-XR TAKE 4 TABLETS BY MOUTH DAILY WITH SUPPER   metoprolol tartrate 25 MG tablet Commonly known as:  LOPRESSOR Take 0.5 tablets (12.5 mg total) 2 (two) times daily by mouth.   nitroGLYCERIN 0.4 MG SL tablet Commonly known as:  NITROSTAT Place 1 tablet (0.4 mg total) every 5 (five) minutes as needed under the tongue for chest pain.   ranitidine 75 MG tablet Commonly known as:  ZANTAC Take 75 mg by mouth 2 (two) times daily.       Allergies: No Known Allergies  Past Medical History:  Diagnosis Date  . CAD (coronary artery disease)    a. 04/2013 Inf STEMI/PCI: LM nl, LAD min irregs, D1/2/3 nl, LCX 53m (2.5x18 Xience DES), OM1/2/3 nl, RCA nl, PDA nl, RPAV nl, RPL nl, EF 55%;  b. 04/2013 Echo: EF 55-60%, no rwma, mild MR, mildly dil LA.  . Diabetes mellitus type 2, uncontrolled ([place])    a. 04/2013 HbA1c = 11.7.  . Hyperlipidemia   . Hypertension     Past Surgical History:  Procedure Laterality Date  . CESAREAN SECTION    .  LEFT HEART CATH  05-15-13  . LEFT HEART CATHETERIZATION WITH CORONARY ANGIOGRAM N/A 05/15/2013   Procedure: LEFT HEART CATHETERIZATION WITH CORONARY ANGIOGRAM;  Surgeon: Wellington Hampshire, MD;  Location: [facility] CATH [facility];  Service: Cardiovascular;  Laterality: N/A;  . PERCUTANEOUS STENT INTERVENTION  05/15/2013   Procedure: PERCUTANEOUS STENT INTERVENTION;  Surgeon: Wellington Hampshire, MD;  Location: [facility];  Service: Cardiovascular;;    Family History  Problem Relation Age of Onset  . Heart disease Mother   . Diabetes Mother   . Hypertension Mother   . Heart disease Father   . Diabetes Father   . Hypertension Father   . Diabetes Brother     Social History:  reports that she has never smoked. She has never used smokeless tobacco. She reports that she does not drink alcohol or use drugs.    Review of Systems   Screening TSH has been done because of her complaints of carpal tunnel syndrome  She does feel tired at times but not worse in the last few months Because of her inconsistent symptoms and high TSH she was told to start levothyroxine 25 mcg daily However she has been taking this only after evening meal and probably not consistently since she thinks it causes her to have bloating No history of goiter  Lab Results  Component Value Date   TSH 5.27 (H) 09/03/2017   TSH 5.53 (H) 04/28/2017   FREET4 0.68 09/03/2017      She has only mild intermittent symptoms of numbness in her hands and mostly when waking up in the morning   Lipids: She Has  been on Lipitor 80 mg for quite some time.  Lipids are well controlled, has history of CAD      Lab Results  Component Value Date   CHOL 91 (L) 04/21/2017   HDL 35 (L) 04/21/2017   LDLCALC 39 04/21/2017   TRIG 85 04/21/2017   CHOLHDL 2.6 04/21/2017                  The blood pressure has been controlled with lisinopril 10 mg    LABS:  Lab on 09/03/2017  Component Date Value Ref Range Status  . Color, Urine 09/03/2017 YELLOW   Yellow;Lt. Yellow Final  . APPearance 09/03/2017 CLEAR  Clear Final  . Specific Gravity, Urine 09/03/2017 1.020  1.000 - 1.030 Final  . pH 09/03/2017 5.5  5.0 - 8.0 Final  . Total Protein, Urine 09/03/2017 NEGATIVE  Negative Final  . Urine Glucose 09/03/2017 >=1000* Negative Final  . Ketones, ur 09/03/2017 NEGATIVE  Negative Final  . Bilirubin Urine 09/03/2017 NEGATIVE  Negative Final  . Hgb urine dipstick 09/03/2017 SMALL* Negative Final  . Urobilinogen, UA 09/03/2017 0.2  0.0 - 1.0 Final  . Leukocytes, UA 09/03/2017 NEGATIVE  Negative Final  . Nitrite 09/03/2017 NEGATIVE  Negative Final  . WBC, UA 09/03/2017 none seen  0-2/hpf Final  . RBC / HPF 09/03/2017 0-2/hpf  0-2/hpf Final  . Squamous Epithelial / LPF 09/03/2017 Rare(0-4/hpf)  Rare(0-4/hpf) Final  . Microalb, Ur 09/03/2017 <0.7  0.0 - 1.9 mg/dL Final  . Creatinine,U 09/03/2017 73.0  mg/dL Final  . Microalb Creat Ratio 09/03/2017 1.0  0.0 - 30.0 mg/g Final  . Free T4 09/03/2017 0.68  0.60 - 1.60 ng/dL Final   Comment: Specimens from patients who are undergoing biotin therapy and /or ingesting biotin supplements may contain high levels of biotin.  The higher biotin concentration in these specimens interferes with this Free T4 assay.  Specimens that contain high levels  of biotin may cause false high results for this Free T4 assay.  Please interpret results in light of the total clinical presentation of the patient.    Marland Kitchen TSH 09/03/2017 5.27* 0.35 - 4.50 uIU/mL Final  . Sodium 09/03/2017 138  135 - 145 mEq/L Final  . Potassium 09/03/2017 4.7  3.5 - 5.1 mEq/L Final  . Chloride 09/03/2017 103  96 - 112 mEq/L Final  . CO2 09/03/2017 25  19 - 32 mEq/L Final  . Glucose, Bld 09/03/2017 131* 70 - 99 mg/dL Final  . BUN 09/03/2017 11  6 - 23 mg/dL Final  . Creatinine, Ser 09/03/2017 0.73  0.40 - 1.20 mg/dL Final  . Total Bilirubin 09/03/2017 0.2  0.2 - 1.2 mg/dL Final  . Alkaline Phosphatase 09/03/2017 55  39 - 117 U/L Final  . AST  09/03/2017 11  0 - 37 U/L Final  . ALT 09/03/2017 8  0 - 35 U/L Final  . Total Protein 09/03/2017 7.2  6.0 - 8.3 g/dL Final  . Albumin 09/03/2017 4.4  3.5 - 5.2 g/dL Final  . Calcium 09/03/2017 10.3  8.4 - 10.5 mg/dL Final  . GFR 09/03/2017 85.49  >60.00 mL/min Final  . Hgb A1c MFr Bld 09/03/2017 7.4* 4.6 - 6.5 % Final   Glycemic Control Guidelines for People with Diabetes:Non Diabetic:  <6%Goal of Therapy: <7%Additional Action Suggested:  >8%     Physical Examination:  BP 134/80 (BP Location: Left Arm, Patient Position: Sitting, Cuff Size: Normal)   Pulse 79   Ht 4\' 11"  (1.499 m)  Wt 133 lb (60.3 kg)   SpO2 97%   BMI 26.86 kg/m        ASSESSMENT:  Diabetes type 2, non-insulin requiring See history of present illness for discussion of current diabetes management, blood sugar patterns and problems identified  A1c is relatively higher at 7.4 Getting lower blood sugar than expected for her A1c again and is checking blood sugars at various times Blood sugars have been higher recently because of some difficulty in getting her medications filled at the pharmacy No side effects from any medications including Invokana   Hypothyroidism: She has a mild increase in TSH and this is persistent, unlikely that she has taken her levothyroxine consistently and probably not absorbing well with taking it after dinner Reassured her that she will not have symptoms of bloating with taking this medication She may have better energy level and less symptoms of carpal tunnel syndrome with correction of hypothyroidism  HYPERTENSION: Well controlled   PLAN:  Start regular exercise Check blood sugar more consistently after meals Advised to take levothyroxine 25 g daily before breakfast daily now No change in other medications  Patient Instructions  Take levothyroxine in am before eating   Elayne Snare 09/08/2017, 8:55 PM    Note: This office note was prepared with Dragon voice recognition  system technology. Any transcriptional errors that result from this process are unintentional.

## 2017-10-09 ENCOUNTER — Encounter: Payer: Self-pay | Admitting: Nurse Practitioner

## 2017-10-27 ENCOUNTER — Ambulatory Visit (INDEPENDENT_AMBULATORY_CARE_PROVIDER_SITE_OTHER): Payer: Medicaid Other | Admitting: Nurse Practitioner

## 2017-10-27 ENCOUNTER — Encounter: Payer: Self-pay | Admitting: Nurse Practitioner

## 2017-10-27 ENCOUNTER — Encounter (INDEPENDENT_AMBULATORY_CARE_PROVIDER_SITE_OTHER): Payer: Self-pay

## 2017-10-27 VITALS — BP 150/72 | HR 68 | Ht <= 58 in | Wt 131.8 lb

## 2017-10-27 DIAGNOSIS — I2510 Atherosclerotic heart disease of native coronary artery without angina pectoris: Secondary | ICD-10-CM

## 2017-10-27 DIAGNOSIS — E785 Hyperlipidemia, unspecified: Secondary | ICD-10-CM | POA: Diagnosis not present

## 2017-10-27 DIAGNOSIS — I10 Essential (primary) hypertension: Secondary | ICD-10-CM | POA: Diagnosis not present

## 2017-10-27 LAB — LIPID PANEL
Chol/HDL Ratio: 2.5 ratio (ref 0.0–4.4)
Cholesterol, Total: 101 mg/dL (ref 100–199)
HDL: 40 mg/dL (ref >39)
LDL Calculated: 38 mg/dL (ref 0–99)
Triglycerides: 115 mg/dL (ref 0–149)
VLDL Cholesterol Cal: 23 mg/dL (ref 5–40)

## 2017-10-27 LAB — CBC
Hematocrit: 31.3 % — ABNORMAL LOW (ref 34.0–46.6)
Hemoglobin: 10 g/dL — ABNORMAL LOW (ref 11.1–15.9)
MCH: 22.5 pg — ABNORMAL LOW (ref 26.6–33.0)
MCHC: 31.9 g/dL (ref 31.5–35.7)
MCV: 70 fL — ABNORMAL LOW (ref 79–97)
Platelets: 372 10*3/uL (ref 150–379)
RBC: 4.45 x10E6/uL (ref 3.77–5.28)
RDW: 16.6 % — ABNORMAL HIGH (ref 12.3–15.4)
WBC: 6.2 10*3/uL (ref 3.4–10.8)

## 2017-10-27 LAB — BASIC METABOLIC PANEL
BUN/Creatinine Ratio: 13 (ref 12–28)
BUN: 9 mg/dL (ref 8–27)
CO2: 23 mmol/L (ref 20–29)
Calcium: 9.6 mg/dL (ref 8.7–10.3)
Chloride: 105 mmol/L (ref 96–106)
Creatinine, Ser: 0.68 mg/dL (ref 0.57–1.00)
GFR calc Af Amer: 108 mL/min/{1.73_m2} (ref >59)
GFR calc non Af Amer: 93 mL/min/{1.73_m2} (ref >59)
Glucose: 142 mg/dL — ABNORMAL HIGH (ref 65–99)
Potassium: 4.7 mmol/L (ref 3.5–5.2)
Sodium: 141 mmol/L (ref 134–144)

## 2017-10-27 MED ORDER — LISINOPRIL 20 MG PO TABS: 20.0000 mg | ORAL_TABLET | Freq: Every day | ORAL | 3 refills | Status: DC

## 2017-10-27 NOTE — Patient Instructions (Addendum)
We will be checking the following labs today - BMET, CBC and lipids  Medication Instructions:    Continue with your current medicines. BUT  I am going to increase the Lisinopril to 20 mg a day - she can 2 of the 10 mg tablets to use up - I have sent the RX for the 20 mg tablet to your pharmacy    Testing/Procedures To Be Arranged:  N/A  Follow-Up:   See me in 3 months    Other Special Instructions:   Continue to monitor the BP for me     If you need a refill on your cardiac medications before your next appointment, please call your pharmacy.   Call the [place] office at [phone] if you have any questions, problems or concerns.

## 2017-10-27 NOTE — Progress Notes (Signed)
CARDIOLOGY OFFICE NOTE  Date:  10/27/2017    [name] [name] Date of Birth: 07-06-1955 Medical Record #[id number]  PCP:  Patient, No Pcp Per  Cardiologist:  Servando Snare & [facility]    Chief Complaint  Patient presents with  . Coronary Artery Disease    6 month check - seen for Dr. Marlou Porch    History of Present Illness: [name] [name] is a 62 y.o. female who presents today for a 6 month check. Former patient of Dr. Claris Gladden - to transition to Dr. Marlou Porch however has not been seen yet.   She has a history of diabetes, HTN, and CAD s/p DES to [facility] in the setting of inferior STEMI in 04/2013.  EF was preserved. Other PMH includes HTN, T2 DM, hypothyroidism, anemia and HLD.   Last seen by me back in November - she was leaving to return to [place] for several months. She was doing well. No PCP. Some mild anemia noted.   Comes in today. Here with her son who translates today. She does not speak [place]. Feels like they may need prescriptions but unsure. No chest pain. Not short of breath. She is walking regularly without any issue. She returned from [place] about 2 months ago - was out of her medicines for about a month due to some paperwork needed for her Medicaid - has been back on for over 6 weeks however her BP has been elevated - typically she will ask her son to check her BP if "feeling bad" only - otherwise unknown. His readings are reviewed off his phone.  Still needs PCP. Other than the BP, he feels she is doing well.   Past Medical History:  Diagnosis Date  . CAD (coronary artery disease)    a. 04/2013 Inf STEMI/PCI: LM nl, LAD min irregs, D1/2/3 nl, LCX 7m (2.5x18 Xience DES), OM1/2/3 nl, RCA nl, PDA nl, RPAV nl, RPL nl, EF 55%;  b. 04/2013 Echo: EF 55-60%, no rwma, mild MR, mildly dil LA.  . Diabetes mellitus type 2, uncontrolled ([place])    a. 04/2013 HbA1c = 11.7.  . Hyperlipidemia   . Hypertension     Past Surgical History:  Procedure Laterality Date  . CESAREAN SECTION      . LEFT HEART CATH  05-15-13  . LEFT HEART CATHETERIZATION WITH CORONARY ANGIOGRAM N/A 05/15/2013   Procedure: LEFT HEART CATHETERIZATION WITH CORONARY ANGIOGRAM;  Surgeon: Wellington Hampshire, MD;  Location: [facility] CATH LAB;  Service: Cardiovascular;  Laterality: N/A;  . PERCUTANEOUS STENT INTERVENTION  05/15/2013   Procedure: PERCUTANEOUS STENT INTERVENTION;  Surgeon: Wellington Hampshire, MD;  Location: [facility] CATH LAB;  Service: Cardiovascular;;     Medications: Current Meds  Medication Sig  . acarbose (PRECOSE) 25 MG tablet take 1 tablet by mouth AT BREAKFAST then 2 tablets by mouth AT LUNCH and 1 tablet by mouth AT DINNER  . aspirin EC 81 MG tablet Take 1 tablet (81 mg total) by mouth daily.  Marland Kitchen atorvastatin (LIPITOR) 80 MG tablet TAKE ONE TABLET BY MOUTH ONCE DAILY AT 6 PM  . clopidogrel (PLAVIX) 75 MG tablet Take 1 tablet (75 mg total) daily by mouth.  Marland Kitchen glucose blood (ACCU-CHEK SMARTVIEW) test strip TEST three times a day as directed Dx code-E11.65  . INVOKANA 100 MG TABS tablet take 1 tablet by mouth daily  . levothyroxine (SYNTHROID) 25 MCG tablet Take 1 tablet (25 mcg total) daily before breakfast by mouth.  . metFORMIN (GLUCOPHAGE-XR) 500 MG 24 hr tablet TAKE  4 TABLETS BY MOUTH DAILY WITH SUPPER  . metoprolol tartrate (LOPRESSOR) 25 MG tablet Take 0.5 tablets (12.5 mg total) 2 (two) times daily by mouth.  . nitroGLYCERIN (NITROSTAT) 0.4 MG SL tablet Place 1 tablet (0.4 mg total) every 5 (five) minutes as needed under the tongue for chest pain.  . ranitidine (ZANTAC) 75 MG tablet Take 75 mg by mouth 2 (two) times daily.  . [DISCONTINUED] fluconazole (DIFLUCAN) 150 MG tablet Take 1 tablet (150 mg total) daily by mouth.  . [DISCONTINUED] lisinopril (PRINIVIL,ZESTRIL) 10 MG tablet Take 1 tablet (10 mg total) daily by mouth.     Allergies: No Known Allergies  Social History: The patient  reports that she has never smoked. She has never used smokeless tobacco. She reports that she does not  drink alcohol or use drugs.   Family History: The patient's family history includes Diabetes in her brother, father, and mother; Heart disease in her father and mother; Hypertension in her father and mother.   Review of Systems: Please see the history of present illness.   Otherwise, the review of systems is positive for none.   All other systems are reviewed and negative.   Physical Exam: VS:  BP (!) 150/72 (BP Location: Left Arm, Patient Position: Sitting, Cuff Size: Normal)   Pulse 68   Ht 4\' 9"  (1.448 m)   Wt 131 lb 12.8 oz (59.8 kg)   BMI 28.52 kg/m  .  BMI Body mass index is 28.52 kg/m.  Wt Readings from Last 3 Encounters:  10/27/17 131 lb 12.8 oz (59.8 kg)  09/08/17 133 lb (60.3 kg)  04/30/17 134 lb 6.4 oz (61 kg)   Recheck of her BP by me is 142/80  General: Pleasant. Well developed, well nourished and in no acute distress. She does not speak [place].   HEENT: Normal.  Neck: Supple, no JVD, carotid bruits, or masses noted.  Cardiac: Regular rate and rhythm. No murmurs, rubs, or gallops. No edema.  Respiratory:  Lungs are clear to auscultation bilaterally with normal work of breathing.  GI: Soft and nontender.  MS: No deformity or atrophy. Gait and ROM intact.  Skin: Warm and dry. Color is normal.  Neuro:  Strength and sensation are intact and no gross focal deficits noted.  Psych: Alert, appropriate and with normal affect.   LABORATORY DATA:  EKG:  EKG is ordered today. This demonstrates NSR.  Lab Results  Component Value Date   WBC 7.5 04/21/2017   HGB 10.2 (L) 04/21/2017   HCT 32.3 (L) 04/21/2017   PLT 353 04/21/2017   GLUCOSE 131 (H) 09/03/2017   CHOL 91 (L) 04/21/2017   TRIG 85 04/21/2017   HDL 35 (L) 04/21/2017   LDLCALC 39 04/21/2017   ALT 8 09/03/2017   AST 11 09/03/2017   NA 138 09/03/2017   K 4.7 09/03/2017   CL 103 09/03/2017   CREATININE 0.73 09/03/2017   BUN 11 09/03/2017   CO2 25 09/03/2017   TSH 5.27 (H) 09/03/2017   HGBA1C 7.4 (H)  09/03/2017   MICROALBUR <0.7 09/03/2017     BNP (last 3 results) No results for input(s): BNP in the last 8760 hours.  ProBNP (last 3 results) No results for input(s): PROBNP in the last 8760 hours.   Other Studies Reviewed Today:  Echo Study Conclusions from 2014  - Left ventricle: The cavity size was normal. Wall thickness was increased in a pattern of mild LVH. Systolic function was normal. The estimated ejection fraction was  in the range of 55% to 60%. Wall motion was normal; there were no regional wall motion abnormalities. - Mitral valve: Mild regurgitation. - Left atrium: The atrium was mildly dilated. - Atrial septum: Redundant but no obvious PFO/ASD No defect or patent foramen ovale was identified.   Coronary angiography 2014: Coronary dominance:Right   Left Main: Normal   Left Anterior Descending (LAD): Normal in size with minor irregularities.  1st diagonal (D1): Normal in size with no significant disease.  2nd diagonal (D2): Medium in size with no significant disease.  3rd diagonal (D3): Small in size with no significant disease.  Circumflex (LCx): Normal in size with minor irregularities proximally. There is a 99% hazy stenosis in the midsegment which is likely the culprit for inferior ST elevation MI. The stenosis is at the origin of OM 2.  1st obtuse marginal: Small in size with minor irregularities.  2nd obtuse marginal: Normal in size with minor irregularities.  3rd obtuse marginal: Normal in size with no significant disease.  Right Coronary Artery: normal in size with no significant disease.  Posterior descending artery: Normal  Posterior AV segment: Small in size with no significant disease.  Posterolateral branchs: Small and normal posterolateral branches.  Left ventriculography: Left ventricular systolic function is normal , LVEF is estimated at 55 %, there is no significant mitral regurgitation   PCI  Note:Following the diagnostic procedure, the decision was made to proceed with PCI. Weight-based bivalirudin was given for anticoagulation. Once a therapeutic ACT was achieved, a 6 [place] XB 3.0 guide catheter was inserted. A intuition coronary guidewire was used to cross the lesion. The lesion was predilated with a 2.5 x 12 balloon. The lesion was then stented with a 2.5 x 18 mm Xience expedition drug-eluting stent. There was a significant vessel mismatch between the proximal and the distal segment of the stent. The stent was postdilated with a 3.0 x 12 noncompliant balloon. Following PCI, there was 0% residual stenosis and TIMI-3 flow. Final angiography confirmed an excellent result. The patient tolerated the procedure well. There were no immediate procedural complications. A TR band was used for radial hemostasis. The patient was transferred to the post catheterization recovery area for further monitoring.  PCI Data: Vessel - left circumflex artery/Segment - mid Percent Stenosis (pre) 99% TIMI-flow 3 Stent : 2.5 x 18 mm Xience expedition drug-eluting stent postdilated with a 3.0 noncompliant balloon in the mid and proximal segment Percent Stenosis (post) 0% TIMI-flow (post) 3   Final Conclusions:  1.Severe one-vessel coronary artery disease with 99% hazy mid left circumflex stenosis which is the culprit for inferior ST elevation MI. 2. Normal LV systolic function and mildly elevated left ventricular end-diastolic pressure. 3. Successful angioplasty and drug-eluting stent placement to the left circumflex artery.  Recommendations:  Continue dual antiplatelet therapy for at least one year. Aggressive treatment of risk factors is recommended.  Muhammad AridaMD, [facility] 05/15/2013, 2:51 AM   Assessment/Plan:  1. XNT:ZGYFVCBS STEMIin 2014 with PCI + DES to Lcx. Single vessel CAD. EF was normal. She continues to do well clinically. She is on chronic DAPT. Continue with CV risk  factor modification.   2. HTN: BP remains elevated - increasing Lisinopril to 20mg  a day - her son will continue to monitor.   3. T2DM: followed by Endocrine  4. Lipids: remains on statin - needs labs today  5. Hypothyroidism - recent TSH noted - managed by Endocrine  6. Mild anemia - rechecking CBC today.   Current medicines are reviewed  with the patient today.  The patient does not have concerns regarding medicines other than what has been noted above.  The following changes have been made:  See above.  Labs/ tests ordered today include:    Orders Placed This Encounter  Procedures  . CBC  . Lipid panel  . Basic metabolic panel  . EKG 12-Lead     Disposition:   FU with me in 3 months.    Patient is agreeable to this plan and will call if any problems develop in the interim.   SignedTruitt Merle, NP  10/27/2017 8:15 AM  [facility] [address] [place], [place]  [address] Phone: [phone] Fax: [phone]

## 2017-10-28 ENCOUNTER — Other Ambulatory Visit: Payer: Self-pay | Admitting: Endocrinology

## 2017-12-07 ENCOUNTER — Other Ambulatory Visit: Payer: Medicaid Other

## 2017-12-09 ENCOUNTER — Ambulatory Visit: Payer: Medicaid Other | Admitting: Endocrinology

## 2017-12-28 ENCOUNTER — Other Ambulatory Visit (INDEPENDENT_AMBULATORY_CARE_PROVIDER_SITE_OTHER): Payer: Medicaid Other

## 2017-12-28 DIAGNOSIS — E1165 Type 2 diabetes mellitus with hyperglycemia: Secondary | ICD-10-CM | POA: Diagnosis not present

## 2017-12-28 DIAGNOSIS — E063 Autoimmune thyroiditis: Secondary | ICD-10-CM

## 2017-12-28 LAB — COMPREHENSIVE METABOLIC PANEL
ALK PHOS: 40 U/L (ref 39–117)
ALT: 10 U/L (ref 0–35)
AST: 11 U/L (ref 0–37)
Albumin: 4.1 g/dL (ref 3.5–5.2)
BILIRUBIN TOTAL: 0.4 mg/dL (ref 0.2–1.2)
BUN: 10 mg/dL (ref 6–23)
CO2: 25 mEq/L (ref 19–32)
Calcium: 9.5 mg/dL (ref 8.4–10.5)
Chloride: 106 mEq/L (ref 96–112)
Creatinine, Ser: 0.7 mg/dL (ref 0.40–1.20)
GFR: 89.64 mL/min (ref >60.00)
GLUCOSE: 136 mg/dL — AB (ref 70–99)
POTASSIUM: 4.3 meq/L (ref 3.5–5.1)
SODIUM: 141 meq/L (ref 135–145)
TOTAL PROTEIN: 6.9 g/dL (ref 6.0–8.3)

## 2017-12-28 LAB — TSH: TSH: 4.26 u[IU]/mL (ref 0.35–4.50)

## 2017-12-28 LAB — HEMOGLOBIN A1C: HEMOGLOBIN A1C: 6.9 % — AB (ref 4.6–6.5)

## 2017-12-31 ENCOUNTER — Ambulatory Visit (INDEPENDENT_AMBULATORY_CARE_PROVIDER_SITE_OTHER): Payer: Medicaid Other | Admitting: Endocrinology

## 2017-12-31 ENCOUNTER — Encounter: Payer: Self-pay | Admitting: Endocrinology

## 2017-12-31 VITALS — BP 142/86 | HR 82 | Ht <= 58 in | Wt 130.6 lb

## 2017-12-31 DIAGNOSIS — E1165 Type 2 diabetes mellitus with hyperglycemia: Secondary | ICD-10-CM | POA: Diagnosis not present

## 2017-12-31 MED ORDER — GABAPENTIN 100 MG PO CAPS: 200.0000 mg | ORAL_CAPSULE | Freq: Every day | ORAL | 3 refills | Status: DC

## 2017-12-31 MED ORDER — PANTOPRAZOLE SODIUM 40 MG PO TBEC: 40.0000 mg | DELAYED_RELEASE_TABLET | Freq: Every day | ORAL | 1 refills | Status: DC

## 2017-12-31 NOTE — Progress Notes (Signed)
Patient ID: [name] [name], female   DOB: 1955-06-21, 62 y.o.   MRN: [id number]           Reason for Appointment: Follow-up for Type 2 Diabetes   History of Present Illness:          Diagnosis: Type 2 diabetes mellitus, date of diagnosis: ?  2014   Past history:   Her son thinks that she may have had diabetes diagnosed when sh 0e was in [place] When she moved to [place] in 2014 she was not on any medications She was however found to have significant hyperglycemia with glucose 364 and A1c of 11.7 when she was admitted for her coronary artery disease in 04/2013. At that time she was started on premixed insulin twice a day Her level of control had not been adequate with persistently high A1c readings She did not tolerate Victoza well and was having decreased appetite, malaise and nausea and this was stopped subsequently  Recent history:        Oral hypoglycemic drugs:     Metformin ER 500 mg 4 tablets daily, Invokana 100 mg daily, Precose 25 mg before meals   Her A1c has been ranging from 7-7.4 and now relatively better at 6.9  Blood sugars are usually lower than expected for her A1c, now averaging 128  Current blood sugar patterns and problems:  She probably had a high A1c on the last visit because of not being consistent with getting her medications through her pharmacy  However she has taken her 3 prescriptions consistently more recently  No side effects from Invokana or acarbose now  She does check her sugars mostly in the mornings, sometimes after breakfast and lunch but usually not after dinner  Overall still seems to have relatively high readings in the mornings  Has been trying to get back regular on her walking  Her weight has improved somewhat since March  No recent changes in diet  Hypoglycemia: None    Side effects from medications have been: None  Self-care: The diet that the patient has been following is: None, has vegetarian diet Usually having  vegetables and roti with lunch and dinner and sometimes having lentils.   She does not like yogurt as it apparently causes heartburn  Usually not eating fried food except for some snacks            Exercise:  Trying to walk regularly       Dietician visit, none  Compliance with the medical regimen: Fair  Glucose monitoring:  done about 1 times a day, less recently       Glucometer: Accu-Chek  Blood Glucose readings by meter download   Mean values apply above for all meters except median for One Touch  PRE-MEAL Fasting Lunch Dinner  overnight Overall  Glucose range:  108-155  103-152  109-138    Mean/median:  136    119  128   POST-MEAL PC Breakfast PC Lunch PC Dinner  Glucose range:  84-149    Mean/median:         Weight history:   Wt Readings from Last 3 Encounters:  12/31/17 130 lb 9.6 oz (59.2 kg)  10/27/17 131 lb 12.8 oz (59.8 kg)  09/08/17 133 lb (60.3 kg)    Glycemic control:   Lab Results  Component Value Date   HGBA1C 6.9 (H) 12/28/2017   HGBA1C 7.4 (H) 09/03/2017   HGBA1C 7.0 (H) 04/28/2017   Lab Results  Component Value Date  MICROALBUR <0.7 09/03/2017   LDLCALC 38 10/27/2017   CREATININE 0.70 12/28/2017    Lab on 12/28/2017  Component Date Value Ref Range Status  . TSH 12/28/2017 4.26  0.35 - 4.50 uIU/mL Final  . Sodium 12/28/2017 141  135 - 145 mEq/L Final  . Potassium 12/28/2017 4.3  3.5 - 5.1 mEq/L Final  . Chloride 12/28/2017 106  96 - 112 mEq/L Final  . CO2 12/28/2017 25  19 - 32 mEq/L Final  . Glucose, Bld 12/28/2017 136* 70 - 99 mg/dL Final  . BUN 12/28/2017 10  6 - 23 mg/dL Final  . Creatinine, Ser 12/28/2017 0.70  0.40 - 1.20 mg/dL Final  . Total Bilirubin 12/28/2017 0.4  0.2 - 1.2 mg/dL Final  . Alkaline Phosphatase 12/28/2017 40  39 - 117 U/L Final  . AST 12/28/2017 11  0 - 37 U/L Final  . ALT 12/28/2017 10  0 - 35 U/L Final  . Total Protein 12/28/2017 6.9  6.0 - 8.3 g/dL Final  . Albumin 12/28/2017 4.1  3.5 - 5.2 g/dL Final  .  Calcium 12/28/2017 9.5  8.4 - 10.5 mg/dL Final  . GFR 12/28/2017 89.64  >60.00 mL/min Final  . Hgb A1c MFr Bld 12/28/2017 6.9* 4.6 - 6.5 % Final   Glycemic Control Guidelines for People with Diabetes:Non Diabetic:  <6%Goal of Therapy: <7%Additional Action Suggested:  >8%       Allergies as of 12/31/2017   No Known Allergies     Medication List        Accurate as of 12/31/17  2:28 PM. Always use your most recent med list.          acarbose 25 MG tablet Commonly known as:  PRECOSE take 1 tablet by mouth AT BREAKFAST then 2 tablets by mouth AT LUNCH and 1 tablet by mouth AT DINNER   aspirin EC 81 MG tablet Take 1 tablet (81 mg total) by mouth daily.   atorvastatin 80 MG tablet Commonly known as:  LIPITOR TAKE ONE TABLET BY MOUTH ONCE DAILY AT 6 PM   clopidogrel 75 MG tablet Commonly known as:  PLAVIX Take 1 tablet (75 mg total) daily by mouth.   gabapentin 100 MG capsule Commonly known as:  NEURONTIN Take 2 capsules (200 mg total) by mouth at bedtime.   glucose blood test strip Commonly known as:  ACCU-CHEK SMARTVIEW TEST three times a day as directed Dx code-E11.65   INVOKANA 100 MG Tabs tablet Generic drug:  canagliflozin take 1 tablet by mouth daily   levothyroxine 25 MCG tablet Commonly known as:  SYNTHROID Take 1 tablet (25 mcg total) daily before breakfast by mouth.   lisinopril 20 MG tablet Commonly known as:  PRINIVIL,ZESTRIL Take 1 tablet (20 mg total) by mouth daily.   metFORMIN 500 MG 24 hr tablet Commonly known as:  GLUCOPHAGE-XR TAKE 4 TABLETS BY MOUTH DAILY WITH SUPPER   metoprolol tartrate 25 MG tablet Commonly known as:  LOPRESSOR Take 0.5 tablets (12.5 mg total) 2 (two) times daily by mouth.   nitroGLYCERIN 0.4 MG SL tablet Commonly known as:  NITROSTAT Place 1 tablet (0.4 mg total) every 5 (five) minutes as needed under the tongue for chest pain.   pantoprazole 40 MG tablet Commonly known as:  PROTONIX Take 1 tablet (40 mg total) by  mouth daily.       Allergies: No Known Allergies  Past Medical History:  Diagnosis Date  . CAD (coronary artery disease)    a. 04/2013  Inf STEMI/PCI: LM nl, LAD min irregs, D1/2/3 nl, LCX 68m (2.5x18 Xience DES), OM1/2/3 nl, RCA nl, PDA nl, RPAV nl, RPL nl, EF 55%;  b. 04/2013 Echo: EF 55-60%, no rwma, mild MR, mildly dil LA.  . Diabetes mellitus type 2, uncontrolled ([facility])    a. 04/2013 HbA1c = 11.7.  . Hyperlipidemia   . Hypertension     Past Surgical History:  Procedure Laterality Date  . CESAREAN SECTION    . LEFT HEART CATH  05-15-13  . LEFT HEART CATHETERIZATION WITH CORONARY ANGIOGRAM N/A 05/15/2013   Procedure: LEFT HEART CATHETERIZATION WITH CORONARY ANGIOGRAM;  Surgeon: Wellington Hampshire, MD;  Location: [facility] CATH LAB;  Service: Cardiovascular;  Laterality: N/A;  . PERCUTANEOUS STENT INTERVENTION  05/15/2013   Procedure: PERCUTANEOUS STENT INTERVENTION;  Surgeon: Wellington Hampshire, MD;  Location: [facility] CATH LAB;  Service: Cardiovascular;;    Family History  Problem Relation Age of Onset  . Heart disease Mother   . Diabetes Mother   . Hypertension Mother   . Heart disease Father   . Diabetes Father   . Hypertension Father   . Diabetes Brother     Social History:  reports that she has never smoked. She has never used smokeless tobacco. She reports that she does not drink alcohol or use drugs.    Review of Systems   HYPOTHYROIDISM: Screening TSH has been done because of her complaints of carpal tunnel syndrome  She does feel tired at times which is not any better recently Because of her high TSH she was told to start levothyroxine 25 mcg daily She does not think she feels much better recently However her TSH is back to normal She is taking in the morning now as directed No history of goiter  Lab Results  Component Value Date   TSH 4.26 12/28/2017   TSH 5.27 (H) 09/03/2017   TSH 5.53 (H) 04/28/2017   FREET4 0.68 09/03/2017      She has only mild intermittent  symptoms of numbness in her hands and mostly when waking up in the morning  However she has some symptoms of burning in her lower arms especially in contact with clothes Also at night she has some pains in her feet and lower legs with some stiffness, this may keep her up at night No shooting pains or tingling or numbness  Lipids: She Has been on Lipitor 80 mg for quite some time.  Lipids are well controlled, has history of CAD      Lab Results  Component Value Date   CHOL 101 10/27/2017   HDL 40 10/27/2017   LDLCALC 38 10/27/2017   TRIG 115 10/27/2017   CHOLHDL 2.5 10/27/2017                  The blood pressure has been Rx with lisinopril 20 mg, blood pressure was higher with cardiologist and dose was increased Blood pressure appears to be the same today  Home BP recently however 127/72   BP Readings from Last 3 Encounters:  12/31/17 (!) 142/86  10/27/17 (!) 150/72  09/08/17 134/80    LABS:  Lab on 12/28/2017  Component Date Value Ref Range Status  . TSH 12/28/2017 4.26  0.35 - 4.50 uIU/mL Final  . Sodium 12/28/2017 141  135 - 145 mEq/L Final  . Potassium 12/28/2017 4.3  3.5 - 5.1 mEq/L Final  . Chloride 12/28/2017 106  96 - 112 mEq/L Final  . CO2 12/28/2017 25  19 -  32 mEq/L Final  . Glucose, Bld 12/28/2017 136* 70 - 99 mg/dL Final  . BUN 12/28/2017 10  6 - 23 mg/dL Final  . Creatinine, Ser 12/28/2017 0.70  0.40 - 1.20 mg/dL Final  . Total Bilirubin 12/28/2017 0.4  0.2 - 1.2 mg/dL Final  . Alkaline Phosphatase 12/28/2017 40  39 - 117 U/L Final  . AST 12/28/2017 11  0 - 37 U/L Final  . ALT 12/28/2017 10  0 - 35 U/L Final  . Total Protein 12/28/2017 6.9  6.0 - 8.3 g/dL Final  . Albumin 12/28/2017 4.1  3.5 - 5.2 g/dL Final  . Calcium 12/28/2017 9.5  8.4 - 10.5 mg/dL Final  . GFR 12/28/2017 89.64  >60.00 mL/min Final  . Hgb A1c MFr Bld 12/28/2017 6.9* 4.6 - 6.5 % Final   Glycemic Control Guidelines for People with Diabetes:Non Diabetic:  <6%Goal of Therapy: <7%Additional  Action Suggested:  >8%     Physical Examination:  BP (!) 142/86 (BP Location: Left Arm, Patient Position: Sitting, Cuff Size: Normal)   Pulse 82   Ht 4\' 9"  (1.448 m)   Wt 130 lb 9.6 oz (59.2 kg)   SpO2 96%   BMI 28.26 kg/m    Repeat blood pressure 152/70   ASSESSMENT:  Diabetes type 2, non-insulin requiring See history of present illness for discussion of current diabetes management, blood sugar patterns and problems identified  A1c is below 7% She does have relatively higher fasting readings averaging about 135 However blood sugars are usually fairly good the rest of the day With taking Precose she does not have obvious postprandial hypoglycemia   Hypothyroidism: She has a mild increase in TSH and this is back to normal with 25 mcg of levothyroxine Not clear if she is symptomatically better, continues to have fatigue because of tendency to insomnia  NEUROPATHY: She appears to have neuropathic symptoms in her feet, mostly at night and some paresthesia in her arms   HYPERTENSION:  controlled   PLAN:  More blood sugar testing after supper No change in diabetes regimen including Invokana Consistent walking for exercise Trial of gabapentin 100-200 mg at bedtime Continue levothyroxine Needs to check blood pressure regularly at home and call if consistently high, may get an Omron monitor  Patient Instructions  Take more sugars after dinner  Omron meter     Elayne Snare 12/31/2017, 2:28 PM    Note: This office note was prepared with Dragon voice recognition system technology. Any transcriptional errors that result from this process are unintentional.

## 2017-12-31 NOTE — Patient Instructions (Addendum)
Take more sugars after dinner  Omron meter

## 2018-01-01 ENCOUNTER — Other Ambulatory Visit: Payer: Self-pay | Admitting: Endocrinology

## 2018-01-07 ENCOUNTER — Telehealth: Payer: Self-pay | Admitting: Endocrinology

## 2018-01-07 NOTE — Telephone Encounter (Signed)
Patient's son stated that he has been calling around trying to find a PCP. He believed dr Dwyane Dee told him at the last visit he has a list that the patient could call Please advise  [phone]

## 2018-01-08 NOTE — Telephone Encounter (Signed)
She can go to her PCP in [facility] system close to her home, [place] please call

## 2018-01-26 ENCOUNTER — Ambulatory Visit (INDEPENDENT_AMBULATORY_CARE_PROVIDER_SITE_OTHER): Payer: Medicaid Other | Admitting: Nurse Practitioner

## 2018-01-26 ENCOUNTER — Encounter: Payer: Self-pay | Admitting: Nurse Practitioner

## 2018-01-26 VITALS — BP 128/70 | HR 80 | Ht <= 58 in | Wt 130.2 lb

## 2018-01-26 DIAGNOSIS — E785 Hyperlipidemia, unspecified: Secondary | ICD-10-CM

## 2018-01-26 DIAGNOSIS — I2510 Atherosclerotic heart disease of native coronary artery without angina pectoris: Secondary | ICD-10-CM | POA: Diagnosis not present

## 2018-01-26 DIAGNOSIS — I10 Essential (primary) hypertension: Secondary | ICD-10-CM

## 2018-01-26 NOTE — Progress Notes (Signed)
CARDIOLOGY OFFICE NOTE  Date:  01/26/2018    [name] [name] Date of Birth: 10-24-1955 Medical Record #[id number]  PCP:  Shawna Clamp, MD  Cardiologist:  Marisa Cyphers   Chief Complaint  Patient presents with  . Hypertension  . Coronary Artery Disease    3 month check - seen for Dr. Marlou Porch    History of Present Illness: [name] [name] is a 62 y.o. female who presents today for a 3 month check. Former patient of Dr. Claris Gladden - to transition to Dr. Marlou Porch however has not been seen yet.   She has ahistory of diabetes, HTN, and CAD s/p DES tomLCxin the setting of inferior STEMI in 04/2013.EF was preserved.Other PMH includes HTN, T2 DM, hypothyroidism, anemia and HLD.  Seen by me back in November - she was leaving to return to [place] for several months. She was doing well. No PCP. Some mild anemia noted. Last seen back in May - here with her son who translated. She was back from [place] but had been out of her medicines for about a month due to some paperwork not completed - had been back on her medicines but BP was elevated. Still no PCP. I increased her ACE.   Comes in today. Here withher son who translates today. She does not speak [place]. She has Medicaid - the list that we gave them for PCP - no one would take her as a new patient. She has an appointment with primary care that her son sees later this month.  Overall doing ok. No chest pain. BP much better. Had a few days where it was in the 140's but overall, has been fine. He feels like she is doing well. She will be going to [place] in January for about 4 or 5 weeks.   Past Medical History:  Diagnosis Date  . CAD (coronary artery disease)    a. 04/2013 Inf STEMI/PCI: LM nl, LAD min irregs, D1/2/3 nl, LCX 42m (2.5x18 Xience DES), OM1/2/3 nl, RCA nl, PDA nl, RPAV nl, RPL nl, EF 55%;  b. 04/2013 Echo: EF 55-60%, no rwma, mild MR, mildly dil LA.  . Diabetes mellitus type 2, uncontrolled ([place])    a. 04/2013 HbA1c =  11.7.  . Hyperlipidemia   . Hypertension     Past Surgical History:  Procedure Laterality Date  . CESAREAN SECTION    . LEFT HEART CATH  05-15-13  . LEFT HEART CATHETERIZATION WITH CORONARY ANGIOGRAM N/A 05/15/2013   Procedure: LEFT HEART CATHETERIZATION WITH CORONARY ANGIOGRAM;  Surgeon: Wellington Hampshire, MD;  Location: [facility] CATH LAB;  Service: Cardiovascular;  Laterality: N/A;  . PERCUTANEOUS STENT INTERVENTION  05/15/2013   Procedure: PERCUTANEOUS STENT INTERVENTION;  Surgeon: Wellington Hampshire, MD;  Location: [facility] CATH LAB;  Service: Cardiovascular;;     Medications: Current Meds  Medication Sig  . acarbose (PRECOSE) 25 MG tablet TAKE 1 TABLET BY MOUTH AT BREAKFAST THEN TAKE 2 TABLETS BY MOUTH AT LUNCH AND 1 TABLET BY MOUTH AT DINNER  . aspirin EC 81 MG tablet Take 1 tablet (81 mg total) by mouth daily.  Marland Kitchen atorvastatin (LIPITOR) 80 MG tablet TAKE ONE TABLET BY MOUTH ONCE DAILY AT 6 PM  . clopidogrel (PLAVIX) 75 MG tablet Take 1 tablet (75 mg total) daily by mouth.  . gabapentin (NEURONTIN) 100 MG capsule Take 2 capsules (200 mg total) by mouth at bedtime.  Marland Kitchen glucose blood (ACCU-CHEK SMARTVIEW) test strip TEST three times a day as directed Dx code-E11.65  .  INVOKANA 100 MG TABS tablet take 1 tablet by mouth daily  . levothyroxine (SYNTHROID) 25 MCG tablet Take 1 tablet (25 mcg total) daily before breakfast by mouth.  . metFORMIN (GLUCOPHAGE-XR) 500 MG 24 hr tablet TAKE 4 TABLETS BY MOUTH DAILY WITH SUPPER  . metoprolol tartrate (LOPRESSOR) 25 MG tablet Take 0.5 tablets (12.5 mg total) 2 (two) times daily by mouth.  . nitroGLYCERIN (NITROSTAT) 0.4 MG SL tablet Place 1 tablet (0.4 mg total) every 5 (five) minutes as needed under the tongue for chest pain.  . pantoprazole (PROTONIX) 40 MG tablet Take 1 tablet (40 mg total) by mouth daily.  . [DISCONTINUED] lisinopril (PRINIVIL,ZESTRIL) 20 MG tablet Take 1 tablet (20 mg total) by mouth daily.     Allergies: No Known Allergies  Social  History: The patient  reports that she has never smoked. She has never used smokeless tobacco. She reports that she does not drink alcohol or use drugs.   Family History: The patient's family history includes Diabetes in her brother, father, and mother; Heart disease in her father and mother; Hypertension in her father and mother.   Review of Systems: Please see the history of present illness.   Otherwise, the review of systems is positive for none.   All other systems are reviewed and negative.   Physical Exam: VS:  BP 128/70 (BP Location: Left Arm, Patient Position: Sitting, Cuff Size: Normal)   Pulse 80   Ht 4\' 8"  (1.422 m)   Wt 130 lb 3.2 oz (59.1 kg)   SpO2 97% Comment: at rest  BMI 29.19 kg/m  .  BMI Body mass index is 29.19 kg/m.  Wt Readings from Last 3 Encounters:  01/26/18 130 lb 3.2 oz (59.1 kg)  12/31/17 130 lb 9.6 oz (59.2 kg)  10/27/17 131 lb 12.8 oz (59.8 kg)    General: Pleasant. Well developed, well nourished and in no acute distress.   HEENT: Normal.  Neck: Supple, no JVD, carotid bruits, or masses noted.  Cardiac: Regular rate and rhythm. No murmurs, rubs, or gallops. No edema.  Respiratory:  Lungs are clear to auscultation bilaterally with normal work of breathing.  GI: Soft and nontender.  MS: No deformity or atrophy. Gait and ROM intact.  Skin: Warm and dry. Color is normal.  Neuro:  Strength and sensation are intact and no gross focal deficits noted.  Psych: Alert, appropriate and with normal affect.   LABORATORY DATA:  EKG:  EKG is not ordered today.  Lab Results  Component Value Date   WBC 6.2 10/27/2017   HGB 10.0 (L) 10/27/2017   HCT 31.3 (L) 10/27/2017   PLT 372 10/27/2017   GLUCOSE 136 (H) 12/28/2017   CHOL 101 10/27/2017   TRIG 115 10/27/2017   HDL 40 10/27/2017   LDLCALC 38 10/27/2017   ALT 10 12/28/2017   AST 11 12/28/2017   NA 141 12/28/2017   K 4.3 12/28/2017   CL 106 12/28/2017   CREATININE 0.70 12/28/2017   BUN 10  12/28/2017   CO2 25 12/28/2017   TSH 4.26 12/28/2017   HGBA1C 6.9 (H) 12/28/2017   MICROALBUR <0.7 09/03/2017     BNP (last 3 results) No results for input(s): BNP in the last 8760 hours.  ProBNP (last 3 results) No results for input(s): PROBNP in the last 8760 hours.   Other Studies Reviewed Today:  EchoStudy Conclusionsfrom 2014  - Left ventricle: The cavity size was normal. Wall thickness was increased in a pattern of mild LVH.  Systolic function was normal. The estimated ejection fraction was in the range of 55% to 60%. Wall motion was normal; there were no regional wall motion abnormalities. - Mitral valve: Mild regurgitation. - Left atrium: The atrium was mildly dilated. - Atrial septum: Redundant but no obvious PFO/ASD No defect or patent foramen ovale was identified.   Coronary angiography2014: Coronary dominance:Right   Left Main: Normal   Left Anterior Descending (LAD): Normal in size with minor irregularities.  1st diagonal (D1): Normal in size with no significant disease.  2nd diagonal (D2): Medium in size with no significant disease.  3rd diagonal (D3): Small in size with no significant disease.  Circumflex (LCx): Normal in size with minor irregularities proximally. There is a 99% hazy stenosis in the midsegment which is likely the culprit for inferior ST elevation MI. The stenosis is at the origin of OM 2.  1st obtuse marginal: Small in size with minor irregularities.  2nd obtuse marginal: Normal in size with minor irregularities.  3rd obtuse marginal: Normal in size with no significant disease.  Right Coronary Artery: normal in size with no significant disease.  Posterior descending artery: Normal  Posterior AV segment: Small in size with no significant disease.  Posterolateral branchs: Small and normal posterolateral branches.  Left ventriculography: Left ventricular systolic function is normal , LVEF is  estimated at 55 %, there is no significant mitral regurgitation   PCI Note:Following the diagnostic procedure, the decision was made to proceed with PCI. Weight-based bivalirudin was given for anticoagulation. Once a therapeutic ACT was achieved, a 6 [place] XB 3.0 guide catheter was inserted. A intuition coronary guidewire was used to cross the lesion. The lesion was predilated with a 2.5 x 12 balloon. The lesion was then stented with a 2.5 x 18 mm Xience expedition drug-eluting stent. There was a significant vessel mismatch between the proximal and the distal segment of the stent. The stent was postdilated with a 3.0 x 12 noncompliant balloon. Following PCI, there was 0% residual stenosis and TIMI-3 flow. Final angiography confirmed an excellent result. The patient tolerated the procedure well. There were no immediate procedural complications. A TR band was used for radial hemostasis. The patient was transferred to the post catheterization recovery area for further monitoring.  PCI Data: Vessel - left circumflex artery/Segment - mid Percent Stenosis (pre) 99% TIMI-flow 3 Stent : 2.5 x 18 mm Xience expedition drug-eluting stent postdilated with a 3.0 noncompliant balloon in the mid and proximal segment Percent Stenosis (post) 0% TIMI-flow (post) 3   Final Conclusions:  1.Severe one-vessel coronary artery disease with 99% hazy mid left circumflex stenosis which is the culprit for inferior ST elevation MI. 2. Normal LV systolic function and mildly elevated left ventricular end-diastolic pressure. 3. Successful angioplasty and drug-eluting stent placement to the left circumflex artery.  Recommendations:  Continue dual antiplatelet therapy for at least one year. Aggressive treatment of risk factors is recommended.  Muhammad AridaMD, [facility] 05/15/2013, 2:51 AM   Assessment/Plan:  1. HTN - recent increase in her ACE - BP looks great on current regimen. No changes made today.    2. CAD:has had prior inferior STEMIin 2014 with PCI + DES to Lcx. Single vessel CAD. EF was normal.She has no symptoms and is doing well. Remains on chronic DAPT therapy. Continue with CV risk factor modification.   3. T2DM:followed by Endocrine  4. Lipids:remains on statin - will need lab on return.   5. Hypothyroidism - not discussed  6. Mild anemia - will  be seeing PCP later this month - would defer to PCP. No active bleeding noted.    Current medicines are reviewed with the patient today.  The patient does not have concerns regarding medicines other than what has been noted above.  The following changes have been made:  See above.  Labs/ tests ordered today include:   No orders of the defined types were placed in this encounter.    Disposition:   FU with me in about mid December prior to leaving for [place].    Patient is agreeable to this plan and will call if any problems develop in the interim.   SignedTruitt Merle, NP  01/26/2018 9:08 AM  [facility] [address] [place], [place]  [address] Phone: [phone] Fax: [phone]

## 2018-01-26 NOTE — Patient Instructions (Addendum)
We will be checking the following labs today - NONE   Medication Instructions:    Continue with your current medicines.     Testing/Procedures To Be Arranged:  N/A  Follow-Up:   See me in mid December with fasting labs    Other Special Instructions:   N/A    If you need a refill on your cardiac medications before your next appointment, please call your pharmacy.   Call the [place] office at [phone] if you have any questions, problems or concerns.

## 2018-02-01 ENCOUNTER — Other Ambulatory Visit: Payer: Self-pay | Admitting: Endocrinology

## 2018-02-02 ENCOUNTER — Other Ambulatory Visit: Payer: Self-pay | Admitting: Nurse Practitioner

## 2018-02-09 ENCOUNTER — Telehealth: Payer: Self-pay | Admitting: Nurse Practitioner

## 2018-02-09 DIAGNOSIS — I2510 Atherosclerotic heart disease of native coronary artery without angina pectoris: Secondary | ICD-10-CM | POA: Diagnosis not present

## 2018-02-09 DIAGNOSIS — I152 Hypertension secondary to endocrine disorders: Secondary | ICD-10-CM | POA: Diagnosis not present

## 2018-02-09 DIAGNOSIS — Z1211 Encounter for screening for malignant neoplasm of colon: Secondary | ICD-10-CM | POA: Diagnosis not present

## 2018-02-09 DIAGNOSIS — E1140 Type 2 diabetes mellitus with diabetic neuropathy, unspecified: Secondary | ICD-10-CM | POA: Diagnosis not present

## 2018-02-09 DIAGNOSIS — K219 Gastro-esophageal reflux disease without esophagitis: Secondary | ICD-10-CM | POA: Diagnosis not present

## 2018-02-09 DIAGNOSIS — E039 Hypothyroidism, unspecified: Secondary | ICD-10-CM | POA: Diagnosis not present

## 2018-02-09 DIAGNOSIS — E785 Hyperlipidemia, unspecified: Secondary | ICD-10-CM | POA: Diagnosis not present

## 2018-02-09 DIAGNOSIS — Z7984 Long term (current) use of oral hypoglycemic drugs: Secondary | ICD-10-CM | POA: Diagnosis not present

## 2018-02-09 DIAGNOSIS — E1159 Type 2 diabetes mellitus with other circulatory complications: Secondary | ICD-10-CM | POA: Diagnosis not present

## 2018-02-09 NOTE — Telephone Encounter (Signed)
S/w pt's son per [facility]) is aware to call office with stool card results to let office know if pt had blood in stool.  Pt is agreeable to recommendation's.

## 2018-02-09 NOTE — Telephone Encounter (Signed)
Routing to Danielle.

## 2018-02-09 NOTE — Telephone Encounter (Signed)
I had planned on continuing Plavix unless there were bleeding complications.  If there are bleeding problems, would be ok to stop.

## 2018-02-09 NOTE — Telephone Encounter (Signed)
Dr. Aundra Dubin had advised long term therapy with the Plavix and aspirin and thus it has been continued.   Is there any indication of bleeding?  We can discuss further at her return visit but would like to continue if possible.

## 2018-02-09 NOTE — Telephone Encounter (Signed)
New Message   Pt c/o medication issue:  1. Name of Medication: clopidogrel (PLAVIX) 75 MG tablet and aspirin EC 81 MG tablet  2. How are you currently taking this medication (dosage and times per day)? Take 1 tablet (75 mg total) daily by mouth and aspirin EC 81 MG tablet  3. Are you having a reaction (difficulty breathing--STAT)? no  4. What is your medication issue? Pt's son is calling, states Dr. Dwyane Dee sugested that since the pt had her stint place that the pt should be taken off either the clopidegril or aspirin not good to take together Please call

## 2018-02-24 ENCOUNTER — Telehealth: Payer: Self-pay | Admitting: Nurse Practitioner

## 2018-02-24 NOTE — Telephone Encounter (Signed)
Stool cards were negative will continue plavix and ASA.  Pt is having a colonoscopy and f/u with Dr. Dwyane Dee.  Will send to [place] to [place].

## 2018-02-24 NOTE — Telephone Encounter (Signed)
Noted.   Dr. Aundra Dubin had opted for continuing Plavix/Aspirin unless bleeding.

## 2018-02-24 NOTE — Telephone Encounter (Signed)
New Message:     Patient son is calling to give results from her stool sample

## 2018-03-04 ENCOUNTER — Other Ambulatory Visit: Payer: Self-pay | Admitting: Endocrinology

## 2018-03-16 DIAGNOSIS — E039 Hypothyroidism, unspecified: Secondary | ICD-10-CM | POA: Diagnosis not present

## 2018-03-16 DIAGNOSIS — Z0000 Encounter for general adult medical examination without abnormal findings: Secondary | ICD-10-CM | POA: Diagnosis not present

## 2018-03-16 DIAGNOSIS — E785 Hyperlipidemia, unspecified: Secondary | ICD-10-CM | POA: Diagnosis not present

## 2018-03-16 DIAGNOSIS — E1159 Type 2 diabetes mellitus with other circulatory complications: Secondary | ICD-10-CM | POA: Diagnosis not present

## 2018-03-16 DIAGNOSIS — I152 Hypertension secondary to endocrine disorders: Secondary | ICD-10-CM | POA: Diagnosis not present

## 2018-03-16 DIAGNOSIS — I2510 Atherosclerotic heart disease of native coronary artery without angina pectoris: Secondary | ICD-10-CM | POA: Diagnosis not present

## 2018-03-16 DIAGNOSIS — E1140 Type 2 diabetes mellitus with diabetic neuropathy, unspecified: Secondary | ICD-10-CM | POA: Diagnosis not present

## 2018-03-30 ENCOUNTER — Other Ambulatory Visit (INDEPENDENT_AMBULATORY_CARE_PROVIDER_SITE_OTHER): Payer: Medicaid Other

## 2018-03-30 DIAGNOSIS — E1165 Type 2 diabetes mellitus with hyperglycemia: Secondary | ICD-10-CM | POA: Diagnosis not present

## 2018-03-30 LAB — BASIC METABOLIC PANEL
BUN: 12 mg/dL (ref 6–23)
CALCIUM: 10.5 mg/dL (ref 8.4–10.5)
CO2: 28 mEq/L (ref 19–32)
CREATININE: 0.69 mg/dL (ref 0.40–1.20)
Chloride: 102 mEq/L (ref 96–112)
GFR: 91.07 mL/min (ref >60.00)
Glucose, Bld: 136 mg/dL — ABNORMAL HIGH (ref 70–99)
Potassium: 4.3 mEq/L (ref 3.5–5.1)
Sodium: 138 mEq/L (ref 135–145)

## 2018-03-30 LAB — HEMOGLOBIN A1C: HEMOGLOBIN A1C: 7.3 % — AB (ref 4.6–6.5)

## 2018-03-31 ENCOUNTER — Encounter: Payer: Self-pay | Admitting: Endocrinology

## 2018-03-31 ENCOUNTER — Ambulatory Visit (INDEPENDENT_AMBULATORY_CARE_PROVIDER_SITE_OTHER): Payer: Medicaid Other | Admitting: Endocrinology

## 2018-03-31 ENCOUNTER — Telehealth: Payer: Self-pay | Admitting: Endocrinology

## 2018-03-31 VITALS — BP 118/74 | HR 74 | Ht 59.0 in | Wt 134.0 lb

## 2018-03-31 DIAGNOSIS — I10 Essential (primary) hypertension: Secondary | ICD-10-CM

## 2018-03-31 DIAGNOSIS — E063 Autoimmune thyroiditis: Secondary | ICD-10-CM

## 2018-03-31 DIAGNOSIS — E1142 Type 2 diabetes mellitus with diabetic polyneuropathy: Secondary | ICD-10-CM

## 2018-03-31 DIAGNOSIS — E1165 Type 2 diabetes mellitus with hyperglycemia: Secondary | ICD-10-CM

## 2018-03-31 NOTE — Telephone Encounter (Signed)
Her lisinopril is not on her medication list.  Please check if she is taking this and what dose, this has not been stopped

## 2018-03-31 NOTE — Patient Instructions (Signed)
Check blood sugars on waking up 3 days a week  Also check blood sugars about 2 hours after meals and do this after different meals by rotation  Recommended blood sugar levels on waking up are 90-130 and about 2 hours after meal is 130-160  Please bring your blood sugar monitor to each visit, thank you  ACARBOSE: Reduce this to half tablet before each meal  Invokana 100 mg: Increase the dose to 2 tablets every morning  When this prescription is finished increase the dose to 300 mg daily, new prescription has been sent  Walk at least 20 to 30 minutes daily

## 2018-03-31 NOTE — Progress Notes (Signed)
Patient ID: [name] [name], female   DOB: 12/24/1955, 62 y.o.   MRN: [id number]           Reason for Appointment: Follow-up for Type 2 Diabetes   History of Present Illness:          Diagnosis: Type 2 diabetes mellitus, date of diagnosis: ?  2014   Past history:   Her son thinks that she may have had diabetes diagnosed when sh 0e was in [place] When she moved to [place] in 2014 she was not on any medications She was however found to have significant hyperglycemia with glucose 364 and A1c of 11.7 when she was admitted for her coronary artery disease in 04/2013. At that time she was started on premixed insulin twice a day Her level of control had not been adequate with persistently high A1c readings She did not tolerate Victoza well and was having decreased appetite, malaise and nausea and this was stopped subsequently  Recent history:        Oral hypoglycemic drugs:     Metformin ER 500 mg 4 tablets daily, Invokana 100 mg daily, Precose 25 mg before meals   Her A1c has been as low as 6.9 but now up to 7.4  Blood sugars are usually lower than expected for her A1c, and averaging 127, previously 128 at home  Current blood sugar patterns and problems:  She does not appear to have any consistent patterns of hyperglycemia to explain her higher A1c  However she has gained weight  She is not as active now  As before her FASTING blood sugars tend to be relatively high and are averaging the highest of the day  Although she thinks she is taking all her medications consistently as directed not clear why her A1c has gone up  Her highest blood sugar was 190 early morning, possibly from a snack  She now says that she feels some gaseousness and gurgling in her stomach but no pain or nausea  Does check blood sugars after meals and these are generally under 150  Hypoglycemia: None    Side effects from medications have been: None  Self-care: The diet that the patient has been  following is: None, has vegetarian diet Usually having vegetables and roti with lunch and dinner and sometimes having lentils.   She does not like yogurt as it apparently causes heartburn  Usually not eating fried food except for some snacks            Exercise:  Trying to walk regularly       Dietician visit, none   Compliance with the medical regimen: Fair  Glucose monitoring:  done about 1 times a day, less recently       Glucometer: Accu-Chek  Blood Glucose readings by meter download    PRE-MEAL Fasting Lunch Dinner Bedtime Overall  Glucose range:  114-159     97-190  Mean/median:      27   POST-MEAL PC Breakfast PC Lunch PC Dinner  Glucose range:     Mean/median:  1 26  126    Previous readings: Mean values apply above for all meters except median for One Touch  PRE-MEAL Fasting Lunch Dinner  overnight Overall  Glucose range:  108-155  103-152  109-138    Mean/median:  136    119  128   POST-MEAL PC Breakfast PC Lunch PC Dinner  Glucose range:  84-149    Mean/median:        Weight  history:   Wt Readings from Last 3 Encounters:  03/31/18 134 lb (60.8 kg)  01/26/18 130 lb 3.2 oz (59.1 kg)  12/31/17 130 lb 9.6 oz (59.2 kg)    Glycemic control:   Lab Results  Component Value Date   HGBA1C 7.3 (H) 03/30/2018   HGBA1C 6.9 (H) 12/28/2017   HGBA1C 7.4 (H) 09/03/2017   Lab Results  Component Value Date   MICROALBUR <0.7 09/03/2017   LDLCALC 38 10/27/2017   CREATININE 0.69 03/30/2018    Lab on 03/30/2018  Component Date Value Ref Range Status  . Sodium 03/30/2018 138  135 - 145 mEq/L Final  . Potassium 03/30/2018 4.3  3.5 - 5.1 mEq/L Final  . Chloride 03/30/2018 102  96 - 112 mEq/L Final  . CO2 03/30/2018 28  19 - 32 mEq/L Final  . Glucose, Bld 03/30/2018 136* 70 - 99 mg/dL Final  . BUN 03/30/2018 12  6 - 23 mg/dL Final  . Creatinine, Ser 03/30/2018 0.69  0.40 - 1.20 mg/dL Final  . Calcium 03/30/2018 10.5  8.4 - 10.5 mg/dL Final  . GFR 03/30/2018  91.07  >60.00 mL/min Final  . Hgb A1c MFr Bld 03/30/2018 7.3* 4.6 - 6.5 % Final   Glycemic Control Guidelines for People with Diabetes:Non Diabetic:  <6%Goal of Therapy: <7%Additional Action Suggested:  >8%       Allergies as of 03/31/2018   No Known Allergies     Medication List        Accurate as of 03/31/18  8:48 AM. Always use your most recent med list.          acarbose 25 MG tablet Commonly known as:  PRECOSE TAKE 1 TABLET BY MOUTH AT BREAKFAST THEN TAKE 2 TABLETS BY MOUTH AT LUNCH AND 1 TABLET BY MOUTH AT DINNER   aspirin EC 81 MG tablet Take 1 tablet (81 mg total) by mouth daily.   atorvastatin 80 MG tablet Commonly known as:  LIPITOR TAKE ONE TABLET BY MOUTH ONCE DAILY AT 6 PM   clopidogrel 75 MG tablet Commonly known as:  PLAVIX Take 1 tablet (75 mg total) daily by mouth.   gabapentin 100 MG capsule Commonly known as:  NEURONTIN Take 2 capsules (200 mg total) by mouth at bedtime.   glucose blood test strip UAD tid to monitor glucose; Dx: E11.65   INVOKANA 100 MG Tabs tablet Generic drug:  canagliflozin take 1 tablet by mouth daily   levothyroxine 25 MCG tablet Commonly known as:  SYNTHROID, LEVOTHROID Take 1 tablet (25 mcg total) daily before breakfast by mouth.   metFORMIN 500 MG 24 hr tablet Commonly known as:  GLUCOPHAGE-XR TAKE 4 TABLETS BY MOUTH DAILY WITH SUPPER   metoprolol tartrate 25 MG tablet Commonly known as:  LOPRESSOR Take 0.5 tablets (12.5 mg total) 2 (two) times daily by mouth.   nitroGLYCERIN 0.4 MG SL tablet Commonly known as:  NITROSTAT Place 1 tablet (0.4 mg total) every 5 (five) minutes as needed under the tongue for chest pain.   pantoprazole 40 MG tablet Commonly known as:  PROTONIX TAKE 1 TABLET (40 MG TOTAL) BY MOUTH DAILY.       Allergies: No Known Allergies  Past Medical History:  Diagnosis Date  . CAD (coronary artery disease)    a. 04/2013 Inf STEMI/PCI: LM nl, LAD min irregs, D1/2/3 nl, LCX 15m (2.5x18  Xience DES), OM1/2/3 nl, RCA nl, PDA nl, RPAV nl, RPL nl, EF 55%;  b. 04/2013 Echo: EF 55-60%, no rwma,  mild MR, mildly dil LA.  . Diabetes mellitus type 2, uncontrolled ([place])    a. 04/2013 HbA1c = 11.7.  . Hyperlipidemia   . Hypertension     Past Surgical History:  Procedure Laterality Date  . CESAREAN SECTION    . LEFT HEART CATH  05-15-13  . LEFT HEART CATHETERIZATION WITH CORONARY ANGIOGRAM N/A 05/15/2013   Procedure: LEFT HEART CATHETERIZATION WITH CORONARY ANGIOGRAM;  Surgeon: Wellington Hampshire, MD;  Location: [facility];  Service: Cardiovascular;  Laterality: N/A;  . PERCUTANEOUS STENT INTERVENTION  05/15/2013   Procedure: PERCUTANEOUS STENT INTERVENTION;  Surgeon: Wellington Hampshire, MD;  Location: [facility];  Service: Cardiovascular;;    Family History  Problem Relation Age of Onset  . Heart disease Mother   . Diabetes Mother   . Hypertension Mother   . Heart disease Father   . Diabetes Father   . Hypertension Father   . Diabetes Brother     Social History:  reports that she has never smoked. She has never used smokeless tobacco. She reports that she does not drink alcohol or use drugs.    Review of Systems   HYPOTHYROIDISM: Screening TSH has been done because of her complaints of carpal tunnel syndrome  She does feel tired at times which is not any better recently Because of her high TSH she was told to start levothyroxine 25 mcg daily  She is taking in the morning now as directed No history of goiter  Last TSH:  Lab Results  Component Value Date   TSH 4.26 12/28/2017   TSH 5.27 (H) 09/03/2017   TSH 5.53 (H) 04/28/2017   FREET4 0.68 09/03/2017     NEUROPATHY: She has mild intermittent symptoms of numbness in her hands and mostly when waking up in the morning  Her symptoms of burning in her lower arms especially in contact with clothes are better with gabapentin Also at night she has some pains in her feet and lower legs with some stiffness, this may  keep her up at night   Lipids: She Has been on Lipitor 80 mg for quite some time.  Lipids are well controlled, has history of CAD      Lab Results  Component Value Date   CHOL 101 10/27/2017   HDL 40 10/27/2017   LDLCALC 38 10/27/2017   TRIG 115 10/27/2017   CHOLHDL 2.5 10/27/2017                  The blood pressure has been treated with lisinopril 20 mg Not clear why this is not on her medication list Blood pressure appears to be well controlled recently  Home BP recently however 127/72   BP Readings from Last 3 Encounters:  03/31/18 118/74  01/26/18 128/70  12/31/17 (!) 142/86    LABS:  Lab on 03/30/2018  Component Date Value Ref Range Status  . Sodium 03/30/2018 138  135 - 145 mEq/L Final  . Potassium 03/30/2018 4.3  3.5 - 5.1 mEq/L Final  . Chloride 03/30/2018 102  96 - 112 mEq/L Final  . CO2 03/30/2018 28  19 - 32 mEq/L Final  . Glucose, Bld 03/30/2018 136* 70 - 99 mg/dL Final  . BUN 03/30/2018 12  6 - 23 mg/dL Final  . Creatinine, Ser 03/30/2018 0.69  0.40 - 1.20 mg/dL Final  . Calcium 03/30/2018 10.5  8.4 - 10.5 mg/dL Final  . GFR 03/30/2018 91.07  >60.00 mL/min Final  . Hgb A1c MFr Bld 03/30/2018  7.3* 4.6 - 6.5 % Final   Glycemic Control Guidelines for People with Diabetes:Non Diabetic:  <6%Goal of Therapy: <7%Additional Action Suggested:  >8%     Physical Examination:  BP 118/74   Pulse 74   Ht 4\' 11"  (1.499 m)   Wt 134 lb (60.8 kg)   SpO2 97%   BMI 27.06 kg/m    Diabetic Foot Exam - Simple   Simple Foot Form Diabetic Foot exam was performed with the following findings:  Yes 03/31/2018  8:54 AM  Visual Inspection No deformities, no ulcerations, no other skin breakdown bilaterally:  Yes Sensation Testing Intact to touch and monofilament testing bilaterally:  Yes Pulse Check See comments:  Yes Comments Pedal pulses not palpable No skin color changes are coldness of the toes present      ASSESSMENT:  Diabetes type 2, non-insulin  requiring See history of present illness for discussion of current diabetes management, blood sugar patterns and problems identified  A1c is now slightly above 7%  Gain blood sugars are highest in the morning but overall lower than expected for her A1c She has gained weight Since she appears to be having some GI side effects from  Hypothyroidism: She will need follow-up in the next visit  NEUROPATHY: She appears to have control of her neuropathic symptoms in her feet, with gabapentin   HYPERTENSION:  controlled, need to verify her medications at home   PLAN:  Increase Invokana to 300 mg Reduce Precose to half a tablet with each meal Follow-up in December   There are no Patient Instructions on file for this visit.  Elayne Snare 03/31/2018, 8:48 AM    Note: This office note was prepared with Dragon voice recognition system technology. Any transcriptional errors that result from this process are unintentional.

## 2018-04-01 ENCOUNTER — Other Ambulatory Visit: Payer: Self-pay | Admitting: Nurse Practitioner

## 2018-04-01 ENCOUNTER — Other Ambulatory Visit: Payer: Self-pay | Admitting: Endocrinology

## 2018-04-01 DIAGNOSIS — I10 Essential (primary) hypertension: Secondary | ICD-10-CM

## 2018-04-01 NOTE — Telephone Encounter (Signed)
Patients vm is full will call again

## 2018-04-13 DIAGNOSIS — Z124 Encounter for screening for malignant neoplasm of cervix: Secondary | ICD-10-CM | POA: Diagnosis not present

## 2018-04-13 DIAGNOSIS — Z1239 Encounter for other screening for malignant neoplasm of breast: Secondary | ICD-10-CM | POA: Diagnosis not present

## 2018-04-20 DIAGNOSIS — Z1239 Encounter for other screening for malignant neoplasm of breast: Secondary | ICD-10-CM | POA: Diagnosis not present

## 2018-04-20 DIAGNOSIS — Z1231 Encounter for screening mammogram for malignant neoplasm of breast: Secondary | ICD-10-CM | POA: Diagnosis not present

## 2018-04-20 NOTE — Telephone Encounter (Signed)
Called pt son--stated not sure if pt still taking Rx lisinopril and will call back to confirm.

## 2018-04-20 NOTE — Telephone Encounter (Signed)
Rx Lisinopril 20 mg 1 tab daily.

## 2018-04-20 NOTE — Telephone Encounter (Signed)
Please call her son to verify lisinopril

## 2018-04-20 NOTE — Telephone Encounter (Signed)
Pt son called back--confirm taking lisinopril 1 tab daily

## 2018-04-26 ENCOUNTER — Other Ambulatory Visit (INDEPENDENT_AMBULATORY_CARE_PROVIDER_SITE_OTHER): Payer: Medicaid Other

## 2018-04-26 DIAGNOSIS — E063 Autoimmune thyroiditis: Secondary | ICD-10-CM | POA: Diagnosis not present

## 2018-04-26 DIAGNOSIS — E1165 Type 2 diabetes mellitus with hyperglycemia: Secondary | ICD-10-CM | POA: Diagnosis not present

## 2018-04-26 LAB — TSH: TSH: 2.46 u[IU]/mL (ref 0.35–4.50)

## 2018-04-26 LAB — BASIC METABOLIC PANEL
BUN: 11 mg/dL (ref 6–23)
CHLORIDE: 104 meq/L (ref 96–112)
CO2: 28 meq/L (ref 19–32)
Calcium: 9.5 mg/dL (ref 8.4–10.5)
Creatinine, Ser: 0.65 mg/dL (ref 0.40–1.20)
GFR: 97.55 mL/min (ref >60.00)
Glucose, Bld: 132 mg/dL — ABNORMAL HIGH (ref 70–99)
Potassium: 4.5 mEq/L (ref 3.5–5.1)
SODIUM: 140 meq/L (ref 135–145)

## 2018-04-26 LAB — HEMOGLOBIN A1C: HEMOGLOBIN A1C: 7.2 % — AB (ref 4.6–6.5)

## 2018-04-27 ENCOUNTER — Telehealth: Payer: Self-pay | Admitting: Endocrinology

## 2018-04-27 NOTE — Progress Notes (Signed)
Patient ID: [name] [name], female   DOB: 1956/04/03, 62 y.o.   MRN: [id number]           Reason for Appointment: Follow-up for Type 2 Diabetes   History of Present Illness:          Diagnosis: Type 2 diabetes mellitus, date of diagnosis: ?  2014   Past history:   Her son thinks that she may have had diabetes diagnosed when sh 0e was in [place] When she moved to [place] in 2014 she was not on any medications She was however found to have significant hyperglycemia with glucose 364 and A1c of 11.7 when she was admitted for her coronary artery disease in 04/2013. At that time she was started on premixed insulin twice a day Her level of control had not been adequate with persistently high A1c readings She did not tolerate Victoza well and was having decreased appetite, malaise and nausea and this was stopped subsequently  Recent history:        Oral hypoglycemic drugs:     Metformin ER 500 mg 4 tablets daily, Invokana 100 mg daily, Precose 25 mg before meals   Her A1c has been as low as 6.9 but now 7.2  Blood sugars are usually lower than expected for her A1c, and averaging 127, previously 128 at home  Current blood sugar patterns and problems:  Her blood sugars are about the same as on her last visit  This is despite reducing her ACARBOSE to 1 tablet at lunch instead of 2  She was previously complaining of gaseousness and gurgling in her stomach  She was also asked to increase her Invokana to 2 tablets because of her tendency to weight gain and needing to reduce her acarbose but she forgot to do this  However her weight is down 1 pound  She is trying to walk indoors during the winter months  FASTING readings are not as high now at home, previously as high as 159 although lab glucose was 136  Blood sugars after lunch and dinner are recently excellent also with highest reading only about 145-155  Morning readings are recently below 120 at home  Hypoglycemia: None      Side effects from medications have been: None  Self-care: The diet that the patient has been following is: None, has vegetarian diet Usually having vegetables and roti with lunch and dinner and sometimes having lentils.   She does not like yogurt as it apparently causes heartburn  Usually not eating fried food except for some snacks            Exercise:  Trying to walk regularly       Dietician visit, none   Compliance with the medical regimen: Fair  Glucose monitoring:  done about 1 times a day, less recently       Glucometer: Accu-Chek  Blood Glucose readings by meter download as above 60-day average 127  Weight history:   Wt Readings from Last 3 Encounters:  04/28/18 133 lb (60.3 kg)  03/31/18 134 lb (60.8 kg)  01/26/18 130 lb 3.2 oz (59.1 kg)    Glycemic control:   Lab Results  Component Value Date   HGBA1C 7.2 (H) 04/26/2018   HGBA1C 7.3 (H) 03/30/2018   HGBA1C 6.9 (H) 12/28/2017   Lab Results  Component Value Date   MICROALBUR <0.7 09/03/2017   LDLCALC 38 10/27/2017   CREATININE 0.65 04/26/2018    Lab on 04/26/2018  Component Date Value Ref Range  Status  . TSH 04/26/2018 2.46  0.35 - 4.50 uIU/mL Final  . Sodium 04/26/2018 140  135 - 145 mEq/L Final  . Potassium 04/26/2018 4.5  3.5 - 5.1 mEq/L Final  . Chloride 04/26/2018 104  96 - 112 mEq/L Final  . CO2 04/26/2018 28  19 - 32 mEq/L Final  . Glucose, Bld 04/26/2018 132* 70 - 99 mg/dL Final  . BUN 04/26/2018 11  6 - 23 mg/dL Final  . Creatinine, Ser 04/26/2018 0.65  0.40 - 1.20 mg/dL Final  . Calcium 04/26/2018 9.5  8.4 - 10.5 mg/dL Final  . GFR 04/26/2018 97.55  >60.00 mL/min Final  . Hgb A1c MFr Bld 04/26/2018 7.2* 4.6 - 6.5 % Final   Glycemic Control Guidelines for People with Diabetes:Non Diabetic:  <6%Goal of Therapy: <7%Additional Action Suggested:  >8%       Allergies as of 04/28/2018   No Known Allergies     Medication List        Accurate as of 04/28/18  8:57 AM. Always use your most  recent med list.          acarbose 25 MG tablet Commonly known as:  PRECOSE TAKE 1 TABLET BY MOUTH AT BREAKFAST THEN TAKE 2 TABLETS BY MOUTH AT LUNCH AND 1 TABLET BY MOUTH AT DINNER   aspirin EC 81 MG tablet Take 1 tablet (81 mg total) by mouth daily.   atorvastatin 80 MG tablet Commonly known as:  LIPITOR TAKE ONE TABLET BY MOUTH ONCE DAILY AT 6 PM   clopidogrel 75 MG tablet Commonly known as:  PLAVIX TAKE 1 TABLET (75 MG TOTAL) DAILY BY MOUTH.   gabapentin 100 MG capsule Commonly known as:  NEURONTIN TAKE 2 CAPSULES (200 MG TOTAL) BY MOUTH AT BEDTIME.   glucose blood test strip UAD tid to monitor glucose; Dx: E11.65   INVOKANA 100 MG Tabs tablet Generic drug:  canagliflozin take 1 tablet by mouth daily   levothyroxine 25 MCG tablet Commonly known as:  SYNTHROID, LEVOTHROID Take 1 tablet (25 mcg total) daily before breakfast by mouth.   levothyroxine 25 MCG tablet Commonly known as:  SYNTHROID, LEVOTHROID TAKE 1 TABLET (25 MCG TOTAL) DAILY BEFORE BREAKFAST BY MOUTH.   lisinopril 20 MG tablet Commonly known as:  PRINIVIL,ZESTRIL Take 20 mg by mouth daily.   metFORMIN 500 MG 24 hr tablet Commonly known as:  GLUCOPHAGE-XR TAKE 4 TABLETS BY MOUTH DAILY WITH SUPPER   metoprolol tartrate 25 MG tablet Commonly known as:  LOPRESSOR TAKE ONE-HALF TABLET (12.5 MG TOTAL) BY MOUTH 2 (TWO) TIMES DAILY   nitroGLYCERIN 0.4 MG SL tablet Commonly known as:  NITROSTAT Place 1 tablet (0.4 mg total) every 5 (five) minutes as needed under the tongue for chest pain.   pantoprazole 40 MG tablet Commonly known as:  PROTONIX TAKE 1 TABLET (40 MG TOTAL) BY MOUTH DAILY.       Allergies: No Known Allergies  Past Medical History:  Diagnosis Date  . CAD (coronary artery disease)    a. 04/2013 Inf STEMI/PCI: LM nl, LAD min irregs, D1/2/3 nl, LCX 83m (2.5x18 Xience DES), OM1/2/3 nl, RCA nl, PDA nl, RPAV nl, RPL nl, EF 55%;  b. 04/2013 Echo: EF 55-60%, no rwma, mild MR, mildly dil  LA.  . Diabetes mellitus type 2, uncontrolled ([place])    a. 04/2013 HbA1c = 11.7.  . Hyperlipidemia   . Hypertension     Past Surgical History:  Procedure Laterality Date  . CESAREAN SECTION    .  LEFT HEART CATH  05-15-13  . LEFT HEART CATHETERIZATION WITH CORONARY ANGIOGRAM N/A 05/15/2013   Procedure: LEFT HEART CATHETERIZATION WITH CORONARY ANGIOGRAM;  Surgeon: Wellington Hampshire, MD;  Location: [facility];  Service: Cardiovascular;  Laterality: N/A;  . PERCUTANEOUS STENT INTERVENTION  05/15/2013   Procedure: PERCUTANEOUS STENT INTERVENTION;  Surgeon: Wellington Hampshire, MD;  Location: [facility];  Service: Cardiovascular;;    Family History  Problem Relation Age of Onset  . Heart disease Mother   . Diabetes Mother   . Hypertension Mother   . Heart disease Father   . Diabetes Father   . Hypertension Father   . Diabetes Brother     Social History:  reports that she has never smoked. She has never used smokeless tobacco. She reports that she does not drink alcohol or use drugs.    Review of Systems   HYPOTHYROIDISM: Screening TSH has been done because of her complaints of carpal tunnel syndrome  She does feel tired at times but is not complaining as much now Because of her high TSH she was told to start levothyroxine 25 mcg daily  She is taking in the morning now as directed No history of goiter  Last TSH:  Lab Results  Component Value Date   TSH 2.46 04/26/2018   TSH 4.26 12/28/2017   TSH 5.27 (H) 09/03/2017   FREET4 0.68 09/03/2017     NEUROPATHY: She has mild intermittent symptoms of numbness in her hands and mostly when waking up in the morning  Her symptoms of burning in her lower arms especially in contact with clothes are better with gabapentin Also at night she has some pains in her feet and lower legs with some stiffness   Lipids: She Has been on Lipitor 80 mg for quite some time.  Lipids are well controlled, has history of CAD      Lab Results    Component Value Date   CHOL 101 10/27/2017   HDL 40 10/27/2017   LDLCALC 38 10/27/2017   TRIG 115 10/27/2017   CHOLHDL 2.5 10/27/2017                  The blood pressure has been treated with lisinopril 20 mg  Blood pressure appears to be well controlled recently  Home BP also being checked periodically   BP Readings from Last 3 Encounters:  04/28/18 138/78  03/31/18 118/74  01/26/18 128/70    LABS:  Lab on 04/26/2018  Component Date Value Ref Range Status  . TSH 04/26/2018 2.46  0.35 - 4.50 uIU/mL Final  . Sodium 04/26/2018 140  135 - 145 mEq/L Final  . Potassium 04/26/2018 4.5  3.5 - 5.1 mEq/L Final  . Chloride 04/26/2018 104  96 - 112 mEq/L Final  . CO2 04/26/2018 28  19 - 32 mEq/L Final  . Glucose, Bld 04/26/2018 132* 70 - 99 mg/dL Final  . BUN 04/26/2018 11  6 - 23 mg/dL Final  . Creatinine, Ser 04/26/2018 0.65  0.40 - 1.20 mg/dL Final  . Calcium 04/26/2018 9.5  8.4 - 10.5 mg/dL Final  . GFR 04/26/2018 97.55  >60.00 mL/min Final  . Hgb A1c MFr Bld 04/26/2018 7.2* 4.6 - 6.5 % Final   Glycemic Control Guidelines for People with Diabetes:Non Diabetic:  <6%Goal of Therapy: <7%Additional Action Suggested:  >8%     Physical Examination:  BP 138/78   Pulse 80   Ht 4\' 9"  (1.448 m)   Wt 133 lb (60.3  kg)   SpO2 98%   BMI 28.78 kg/m    No ankle edema  ASSESSMENT:  Diabetes type 2, non-insulin requiring See history of present illness for discussion of current diabetes management, blood sugar patterns and problems identified  A1c is again slightly above 7%  Her blood sugars are excellent at home and appear to be slightly lower than lab glucose also Does not have postprandial hyperglycemia recently With reducing acarbose by 25 mg her GI symptoms are better and she is agreeable to continuing the same dose Since fasting readings are comparatively better will not increase her Invokana at this time  Hypothyroidism: Mild and well controlled  NEUROPATHY: Controlled  with gabapentin   HYPERTENSION:  controlled, continue lisinopril   PLAN:  As above Flu vaccine given, reassured her that this is safe  She will follow-up in February after she returns from [place] Encouraged her to stay active and walk as well as continue monitoring her blood sugars before or after meals   There are no Patient Instructions on file for this visit.  Elayne Snare 04/28/2018, 8:57 AM    Note: This office note was prepared with Dragon voice recognition system technology. Any transcriptional errors that result from this process are unintentional.

## 2018-04-27 NOTE — Telephone Encounter (Signed)
[facility] ph# [phone] called re: needs clarification on dosage of Invokana. Please call Pharmacy with correct dosage for Invokana.

## 2018-04-27 NOTE — Telephone Encounter (Signed)
This has been resolved

## 2018-04-28 ENCOUNTER — Ambulatory Visit (INDEPENDENT_AMBULATORY_CARE_PROVIDER_SITE_OTHER): Payer: Medicaid Other | Admitting: Endocrinology

## 2018-04-28 ENCOUNTER — Encounter: Payer: Self-pay | Admitting: Endocrinology

## 2018-04-28 VITALS — BP 138/78 | HR 80 | Ht <= 58 in | Wt 133.0 lb

## 2018-04-28 DIAGNOSIS — Z23 Encounter for immunization: Secondary | ICD-10-CM

## 2018-04-28 DIAGNOSIS — R928 Other abnormal and inconclusive findings on diagnostic imaging of breast: Secondary | ICD-10-CM | POA: Diagnosis not present

## 2018-04-28 DIAGNOSIS — E1165 Type 2 diabetes mellitus with hyperglycemia: Secondary | ICD-10-CM | POA: Diagnosis not present

## 2018-04-28 DIAGNOSIS — N6324 Unspecified lump in the left breast, lower inner quadrant: Secondary | ICD-10-CM | POA: Diagnosis not present

## 2018-04-28 DIAGNOSIS — E063 Autoimmune thyroiditis: Secondary | ICD-10-CM

## 2018-04-28 MED ORDER — LEVOTHYROXINE SODIUM 25 MCG PO TABS: 25.0000 ug | ORAL_TABLET | Freq: Every day | ORAL | 3 refills | Status: DC

## 2018-04-28 MED ORDER — CANAGLIFLOZIN 100 MG PO TABS: 100.0000 mg | ORAL_TABLET | Freq: Every day | ORAL | 1 refills | Status: DC

## 2018-04-28 MED ORDER — METFORMIN HCL ER 500 MG PO TB24: ORAL_TABLET | ORAL | 1 refills | Status: DC

## 2018-04-28 MED ORDER — ACARBOSE 25 MG PO TABS: ORAL_TABLET | ORAL | 1 refills | Status: DC

## 2018-04-28 NOTE — Addendum Note (Signed)
Addended by: Elta Guadeloupe on: 04/28/2018 12:03 PM   Modules accepted: Orders

## 2018-05-24 NOTE — Progress Notes (Deleted)
CARDIOLOGY OFFICE NOTE  Date:  05/25/2018    [name] Date of Birth: 06-10-1956 Medical Record #[id number]  PCP:  Shawna Clamp, MD  Cardiologist:  Servando Snare & ***    No chief complaint on file.   History of Present Illness: Micaila Ziemba is a 62 y.o. female who presents today for a ***   Comes in today. Here with   Past Medical History:  Diagnosis Date  . CAD (coronary artery disease)    a. 04/2013 Inf STEMI/PCI: LM nl, LAD min irregs, D1/2/3 nl, LCX 70m (2.5x18 Xience DES), OM1/2/3 nl, RCA nl, PDA nl, RPAV nl, RPL nl, EF 55%;  b. 04/2013 Echo: EF 55-60%, no rwma, mild MR, mildly dil LA.  . Diabetes mellitus type 2, uncontrolled ([place])    a. 04/2013 HbA1c = 11.7.  . Hyperlipidemia   . Hypertension     Past Surgical History:  Procedure Laterality Date  . CESAREAN SECTION    . LEFT HEART CATH  05-15-13  . LEFT HEART CATHETERIZATION WITH CORONARY ANGIOGRAM N/A 05/15/2013   Procedure: LEFT HEART CATHETERIZATION WITH CORONARY ANGIOGRAM;  Surgeon: Wellington Hampshire, MD;  Location: [facility] CATH LAB;  Service: Cardiovascular;  Laterality: N/A;  . PERCUTANEOUS STENT INTERVENTION  05/15/2013   Procedure: PERCUTANEOUS STENT INTERVENTION;  Surgeon: Wellington Hampshire, MD;  Location: [facility] CATH [facility];  Service: Cardiovascular;;     Medications: No outpatient medications have been marked as taking for the 05/25/18 encounter (Appointment) with Burtis Junes, NP.     Allergies: No Known Allergies  Social History: The patient  reports that she has never smoked. She has never used smokeless tobacco. She reports that she does not drink alcohol or use drugs.   Family History: The patient's ***family history includes Diabetes in her brother, father, and mother; Heart disease in her father and mother; Hypertension in her father and mother.   Review of Systems: Please see the history of present illness.   Otherwise, the review of systems is positive for {NONE  DEFAULTED:18576::"none"}.   All other systems are reviewed and negative.   Physical Exam: VS:  There were no vitals taken for this visit. Marland Kitchen  BMI There is no height or weight on file to calculate BMI.  Wt Readings from Last 3 Encounters:  04/28/18 133 lb (60.3 kg)  03/31/18 134 lb (60.8 kg)  01/26/18 130 lb 3.2 oz (59.1 kg)    General: Pleasant. Well developed, well nourished and in no acute distress.   HEENT: Normal.  Neck: Supple, no JVD, carotid bruits, or masses noted.  Cardiac: ***Regular rate and rhythm. No murmurs, rubs, or gallops. No edema.  Respiratory:  Lungs are clear to auscultation bilaterally with normal work of breathing.  GI: Soft and nontender.  MS: No deformity or atrophy. Gait and ROM intact.  Skin: Warm and dry. Color is normal.  Neuro:  Strength and sensation are intact and no gross focal deficits noted.  Psych: Alert, appropriate and with normal affect.   LABORATORY DATA:  EKG:  EKG {ACTION; IS/IS RSW:[id number]} ordered today. This demonstrates ***.  Lab Results  Component Value Date   WBC 6.2 10/27/2017   HGB 10.0 (L) 10/27/2017   HCT 31.3 (L) 10/27/2017   PLT 372 10/27/2017   GLUCOSE 132 (H) 04/26/2018   CHOL 101 10/27/2017   TRIG 115 10/27/2017   HDL 40 10/27/2017   LDLCALC 38 10/27/2017   ALT 10 12/28/2017   AST 11 12/28/2017   NA 140 04/26/2018  K 4.5 04/26/2018   CL 104 04/26/2018   CREATININE 0.65 04/26/2018   BUN 11 04/26/2018   CO2 28 04/26/2018   TSH 2.46 04/26/2018   HGBA1C 7.2 (H) 04/26/2018   MICROALBUR <0.7 09/03/2017     BNP (last 3 results) No results for input(s): BNP in the last 8760 hours.  ProBNP (last 3 results) No results for input(s): PROBNP in the last 8760 hours.   Other Studies Reviewed Today:   Assessment/Plan:   Current medicines are reviewed with the patient today.  The patient does not have concerns regarding medicines other than what has been noted above.  The following changes have been made:  See  above.  Labs/ tests ordered today include:   No orders of the defined types were placed in this encounter.    Disposition:   FU with *** in {gen number 2-75:170017} {Days to years:10300}.   Patient is agreeable to this plan and will call if any problems develop in the interim.   SignedTruitt Merle, NP  05/25/2018 7:44 AM  [facility] [address] [place], [place]  [address] Phone: [phone] Fax: [phone]           CARDIOLOGY OFFICE NOTE  Date:  05/24/2018    [name] Date of Birth: 06-30-1955 Medical Record #[id number]  PCP:  Shawna Clamp, MD  Cardiologist:  Servando Snare & ***    No chief complaint on file.   History of Present Illness: Laporscha Linehan is a 62 y.o. female who presents today for a ***   Comes in today. Here with   Past Medical History:  Diagnosis Date  . CAD (coronary artery disease)    a. 04/2013 Inf STEMI/PCI: LM nl, LAD min irregs, D1/2/3 nl, LCX 42m (2.5x18 Xience DES), OM1/2/3 nl, RCA nl, PDA nl, RPAV nl, RPL nl, EF 55%;  b. 04/2013 Echo: EF 55-60%, no rwma, mild MR, mildly dil LA.  . Diabetes mellitus type 2, uncontrolled ([place])    a. 04/2013 HbA1c = 11.7.  . Hyperlipidemia   . Hypertension     Past Surgical History:  Procedure Laterality Date  . CESAREAN SECTION    . LEFT HEART CATH  05-15-13  . LEFT HEART CATHETERIZATION WITH CORONARY ANGIOGRAM N/A 05/15/2013   Procedure: LEFT HEART CATHETERIZATION WITH CORONARY ANGIOGRAM;  Surgeon: Wellington Hampshire, MD;  Location: [facility] CATH LAB;  Service: Cardiovascular;  Laterality: N/A;  . PERCUTANEOUS STENT INTERVENTION  05/15/2013   Procedure: PERCUTANEOUS STENT INTERVENTION;  Surgeon: Wellington Hampshire, MD;  Location: [facility] CATH LAB;  Service: Cardiovascular;;     Medications: No outpatient medications have been marked as taking for the 05/25/18 encounter (Appointment) with Burtis Junes, NP.     Allergies: No Known  Allergies  Social History: The patient  reports that she has never smoked. She has never used smokeless tobacco. She reports that she does not drink alcohol or use drugs.   Family History: The patient's ***family history includes Diabetes in her brother, father, and mother; Heart disease in her father and mother; Hypertension in her father and mother.   Review of Systems: Please see the history of present illness.   Otherwise, the review of systems is positive for {NONE DEFAULTED:18576::"none"}.   All other systems are reviewed and negative.   Physical Exam: VS:  There were no vitals taken for this visit. Marland Kitchen  BMI There is no height or weight on file to calculate BMI.  Wt Readings  from Last 3 Encounters:  04/28/18 133 lb (60.3 kg)  03/31/18 134 lb (60.8 kg)  01/26/18 130 lb 3.2 oz (59.1 kg)    General: Pleasant. Well developed, well nourished and in no acute distress.   HEENT: Normal.  Neck: Supple, no JVD, carotid bruits, or masses noted.  Cardiac: ***Regular rate and rhythm. No murmurs, rubs, or gallops. No edema.  Respiratory:  Lungs are clear to auscultation bilaterally with normal work of breathing.  GI: Soft and nontender.  MS: No deformity or atrophy. Gait and ROM intact.  Skin: Warm and dry. Color is normal.  Neuro:  Strength and sensation are intact and no gross focal deficits noted.  Psych: Alert, appropriate and with normal affect.   LABORATORY DATA:  EKG:  EKG {ACTION; IS/IS FSE:[id number]} ordered today. This demonstrates ***.  Lab Results  Component Value Date   WBC 6.2 10/27/2017   HGB 10.0 (L) 10/27/2017   HCT 31.3 (L) 10/27/2017   PLT 372 10/27/2017   GLUCOSE 132 (H) 04/26/2018   CHOL 101 10/27/2017   TRIG 115 10/27/2017   HDL 40 10/27/2017   LDLCALC 38 10/27/2017   ALT 10 12/28/2017   AST 11 12/28/2017   NA 140 04/26/2018   K 4.5 04/26/2018   CL 104 04/26/2018   CREATININE 0.65 04/26/2018   BUN 11 04/26/2018   CO2 28 04/26/2018   TSH 2.46  04/26/2018   HGBA1C 7.2 (H) 04/26/2018   MICROALBUR <0.7 09/03/2017     BNP (last 3 results) No results for input(s): BNP in the last 8760 hours.  ProBNP (last 3 results) No results for input(s): PROBNP in the last 8760 hours.   Other Studies Reviewed Today:   Assessment/Plan:   Current medicines are reviewed with the patient today.  The patient does not have concerns regarding medicines other than what has been noted above.  The following changes have been made:  See above.  Labs/ tests ordered today include:   No orders of the defined types were placed in this encounter.    Disposition:   FU with *** in {gen number 3-43:568616} {Days to years:10300}.   Patient is agreeable to this plan and will call if any problems develop in the interim.   SignedTruitt Merle, NP  05/24/2018 4:33 PM  [facility] [address] [place], [place]  [address] Phone: [phone] Fax: [phone]

## 2018-05-25 ENCOUNTER — Telehealth: Payer: Self-pay | Admitting: *Deleted

## 2018-05-25 ENCOUNTER — Encounter: Payer: Self-pay | Admitting: Nurse Practitioner

## 2018-05-25 ENCOUNTER — Ambulatory Visit (INDEPENDENT_AMBULATORY_CARE_PROVIDER_SITE_OTHER): Payer: Medicaid Other | Admitting: Nurse Practitioner

## 2018-05-25 VITALS — BP 138/78 | HR 80 | Ht <= 58 in | Wt 134.0 lb

## 2018-05-25 DIAGNOSIS — E785 Hyperlipidemia, unspecified: Secondary | ICD-10-CM

## 2018-05-25 DIAGNOSIS — I10 Essential (primary) hypertension: Secondary | ICD-10-CM | POA: Diagnosis not present

## 2018-05-25 DIAGNOSIS — D649 Anemia, unspecified: Secondary | ICD-10-CM

## 2018-05-25 DIAGNOSIS — I2510 Atherosclerotic heart disease of native coronary artery without angina pectoris: Secondary | ICD-10-CM

## 2018-05-25 LAB — LIPID PANEL
Chol/HDL Ratio: 2.7 ratio (ref 0.0–4.4)
Cholesterol, Total: 103 mg/dL (ref 100–199)
HDL: 38 mg/dL — ABNORMAL LOW (ref >39)
LDL Calculated: 43 mg/dL (ref 0–99)
Triglycerides: 112 mg/dL (ref 0–149)
VLDL Cholesterol Cal: 22 mg/dL (ref 5–40)

## 2018-05-25 LAB — CBC
Hematocrit: 30.4 % — ABNORMAL LOW (ref 34.0–46.6)
Hemoglobin: 8.8 g/dL — ABNORMAL LOW (ref 11.1–15.9)
MCH: 21.1 pg — ABNORMAL LOW (ref 26.6–33.0)
MCHC: 28.9 g/dL — ABNORMAL LOW (ref 31.5–35.7)
MCV: 73 fL — ABNORMAL LOW (ref 79–97)
Platelets: 384 10*3/uL (ref 150–450)
RBC: 4.18 x10E6/uL (ref 3.77–5.28)
RDW: 17.7 % — ABNORMAL HIGH (ref 12.3–15.4)
WBC: 6.7 10*3/uL (ref 3.4–10.8)

## 2018-05-25 LAB — BASIC METABOLIC PANEL
BUN/Creatinine Ratio: 14 (ref 12–28)
BUN: 10 mg/dL (ref 8–27)
CO2: 20 mmol/L (ref 20–29)
Calcium: 10.1 mg/dL (ref 8.7–10.3)
Chloride: 99 mmol/L (ref 96–106)
Creatinine, Ser: 0.74 mg/dL (ref 0.57–1.00)
GFR calc Af Amer: 99 mL/min/{1.73_m2} (ref >59)
GFR calc non Af Amer: 86 mL/min/{1.73_m2} (ref >59)
Glucose: 128 mg/dL — ABNORMAL HIGH (ref 65–99)
Potassium: 4.5 mmol/L (ref 3.5–5.2)
Sodium: 138 mmol/L (ref 134–144)

## 2018-05-25 LAB — HEPATIC FUNCTION PANEL
ALT: 10 IU/L (ref 0–32)
AST: 14 IU/L (ref 0–40)
Albumin: 4.4 g/dL (ref 3.6–4.8)
Alkaline Phosphatase: 44 IU/L (ref 39–117)
Bilirubin Total: 0.3 mg/dL (ref 0.0–1.2)
Bilirubin, Direct: 0.13 mg/dL (ref 0.00–0.40)
Total Protein: 6.8 g/dL (ref 6.0–8.5)

## 2018-05-25 MED ORDER — LISINOPRIL 20 MG PO TABS: 20.0000 mg | ORAL_TABLET | Freq: Every day | ORAL | 3 refills | Status: DC

## 2018-05-25 MED ORDER — ATORVASTATIN CALCIUM 80 MG PO TABS: 80.0000 mg | ORAL_TABLET | Freq: Every day | ORAL | 3 refills | Status: DC

## 2018-05-25 MED ORDER — PANTOPRAZOLE SODIUM 40 MG PO TBEC: 40.0000 mg | DELAYED_RELEASE_TABLET | Freq: Every day | ORAL | 3 refills | Status: DC

## 2018-05-25 MED ORDER — METOPROLOL TARTRATE 25 MG PO TABS: ORAL_TABLET | ORAL | 3 refills | Status: DC

## 2018-05-25 MED ORDER — CLOPIDOGREL BISULFATE 75 MG PO TABS: 75.0000 mg | ORAL_TABLET | Freq: Every day | ORAL | 3 refills | Status: DC

## 2018-05-25 MED ORDER — NITROGLYCERIN 0.4 MG SL SUBL: 0.4000 mg | SUBLINGUAL_TABLET | SUBLINGUAL | 3 refills | Status: DC | PRN

## 2018-05-25 NOTE — Progress Notes (Signed)
CARDIOLOGY OFFICE NOTE  Date:  05/25/2018    [name] Date of Birth: [birth year] Medical Record #[id number]  PCP:  Shawna Clamp, MD  Cardiologist:  Marisa Cyphers    Chief Complaint  Patient presents with  . Hypertension  . Coronary Artery Disease    Follow up visit - seen for Dr. Marlou Porch    History of Present Illness: [name] [name] is a 62 y.o. female who presents today for a follow up visit. Former patient of Dr. Claris Gladden - to transition to Dr. Marlou Porch however has not been seen yet. Has primarily seen me.   She has ahistory of diabetes, HTN, and CAD s/p DES tomLCxin the setting of inferior STEMI in 04/2013.EF was preserved.Other PMH includes HTN, T2 DM, hypothyroidism, anemiaand HLD.  Seen by me back in November of 2018 - she was leaving to return to [place] for several months. She was doing well. No PCP. Some mild anemia noted.I saw her back in May - here with her son who translated. She was back from [place] but had been out of her medicines for about a month due to some paperwork not completed - had been back on her medicines but BP was elevated. Not able to obtain PCP due to having Medicaid. She was doing well when I last saw her in August.   Comes in today. Here withherson who translates today.She does not speak [place]. Getting ready to leave for [place] again and will be gone several months. She is doing well. No chest pain. Breathing is good. Has obtained PCP now. Has had some general health measures done. Tolerating her medicines. No real issues. Needs meds refilled since leaving the country.   Past Medical History:  Diagnosis Date  . CAD (coronary artery disease)    a. 04/2013 Inf STEMI/PCI: LM nl, LAD min irregs, D1/2/3 nl, LCX 66m (2.5x18 Xience DES), OM1/2/3 nl, RCA nl, PDA nl, RPAV nl, RPL nl, EF 55%;  b. 04/2013 Echo: EF 55-60%, no rwma, mild MR, mildly dil LA.  . Diabetes mellitus type 2, uncontrolled ([place])    a. 04/2013 HbA1c = 11.7.  .  Hyperlipidemia   . Hypertension     Past Surgical History:  Procedure Laterality Date  . CESAREAN SECTION    . LEFT HEART CATH  05-15-13  . LEFT HEART CATHETERIZATION WITH CORONARY ANGIOGRAM N/A 05/15/2013   Procedure: LEFT HEART CATHETERIZATION WITH CORONARY ANGIOGRAM;  Surgeon: Wellington Hampshire, MD;  Location: [facility] C[facility];  Service: Cardiovascular;  Laterality: N/A;  . PERCUTANEOUS STENT INTERVENTION  05/15/2013   Procedure: PERCUTANEOUS STENT INTERVENTION;  Surgeon: Wellington Hampshire, MD;  Location: [facility] CATH [facility];  Service: Cardiovascular;;     Medications: Current Meds  Medication Sig  . acarbose (PRECOSE) 25 MG tablet TAKE 1 TABLET BY MOUTH AT BREAKFAST THEN TAKE 2 TABLETS BY MOUTH AT LUNCH AND 1 TABLET BY MOUTH AT DINNER  . aspirin EC 81 MG tablet Take 1 tablet (81 mg total) by mouth daily.  Marland Kitchen atorvastatin (LIPITOR) 80 MG tablet TAKE ONE TABLET BY MOUTH ONCE DAILY AT 6 PM  . canagliflozin (INVOKANA) 100 MG TABS tablet Take 1 tablet (100 mg total) by mouth daily.  . clopidogrel (PLAVIX) 75 MG tablet TAKE 1 TABLET (75 MG TOTAL) DAILY BY MOUTH.  . gabapentin (NEURONTIN) 100 MG capsule TAKE 2 CAPSULES (200 MG TOTAL) BY MOUTH AT BEDTIME.  Marland Kitchen glucose blood (ACCU-CHEK SMARTVIEW) test strip UAD tid to monitor glucose; Dx: E11.65  . levothyroxine (  SYNTHROID, LEVOTHROID) 25 MCG tablet Take 1 tablet (25 mcg total) by mouth daily before breakfast.  . lisinopril (PRINIVIL,ZESTRIL) 20 MG tablet Take 20 mg by mouth daily.  . metFORMIN (GLUCOPHAGE-XR) 500 MG 24 hr tablet TAKE 4 TABLETS BY MOUTH DAILY WITH SUPPER  . metoprolol tartrate (LOPRESSOR) 25 MG tablet TAKE ONE-HALF TABLET (12.5 MG TOTAL) BY MOUTH 2 (TWO) TIMES DAILY  . nitroGLYCERIN (NITROSTAT) 0.4 MG SL tablet Place 1 tablet (0.4 mg total) every 5 (five) minutes as needed under the tongue for chest pain.  . pantoprazole (PROTONIX) 40 MG tablet TAKE 1 TABLET (40 MG TOTAL) BY MOUTH DAILY.  . [DISCONTINUED] levothyroxine (SYNTHROID) 25 MCG  tablet Take 1 tablet (25 mcg total) daily before breakfast by mouth.     Allergies: No Known Allergies  Social History: The patient  reports that she has never smoked. She has never used smokeless tobacco. She reports that she does not drink alcohol or use drugs.   Family History: The patient's family history includes Diabetes in her brother, father, and mother; Heart disease in her father and mother; Hypertension in her father and mother.   Review of Systems: Please see the history of present illness.   Otherwise, the review of systems is positive for none.   All other systems are reviewed and negative.   Physical Exam: VS:  BP 138/78   Pulse 80   Ht 4\' 9"  (1.448 m)   Wt 134 lb (60.8 kg)   SpO2 99%   BMI 29.00 kg/m  .  BMI Body mass index is 29 kg/m.  Wt Readings from Last 3 Encounters:  05/25/18 134 lb (60.8 kg)  04/28/18 133 lb (60.3 kg)  03/31/18 134 lb (60.8 kg)    General: Pleasant. Well developed, well nourished and in no acute distress.   HEENT: Normal.  Neck: Supple, no JVD, carotid bruits, or masses noted.  Cardiac: Regular rate and rhythm. No murmurs, rubs, or gallops. No edema.  Respiratory:  Lungs are clear to auscultation bilaterally with normal work of breathing.  GI: Soft and nontender.  MS: No deformity or atrophy. Gait and ROM intact.  Skin: Warm and dry. Color is normal.  Neuro:  Strength and sensation are intact and no gross focal deficits noted.  Psych: Alert, appropriate and with normal affect.   LABORATORY DATA:  EKG:  EKG is not ordered today.   Lab Results  Component Value Date   WBC 6.2 10/27/2017   HGB 10.0 (L) 10/27/2017   HCT 31.3 (L) 10/27/2017   PLT 372 10/27/2017   GLUCOSE 132 (H) 04/26/2018   CHOL 101 10/27/2017   TRIG 115 10/27/2017   HDL 40 10/27/2017   LDLCALC 38 10/27/2017   ALT 10 12/28/2017   AST 11 12/28/2017   NA 140 04/26/2018   K 4.5 04/26/2018   CL 104 04/26/2018   CREATININE 0.65 04/26/2018   BUN 11  04/26/2018   CO2 28 04/26/2018   TSH 2.46 04/26/2018   HGBA1C 7.2 (H) 04/26/2018   MICROALBUR <0.7 09/03/2017     BNP (last 3 results) No results for input(s): BNP in the last 8760 hours.  ProBNP (last 3 results) No results for input(s): PROBNP in the last 8760 hours.   Other Studies Reviewed Today:  EchoStudy Conclusionsfrom 2014  - Left ventricle: The cavity size was normal. Wall thickness was increased in a pattern of mild LVH. Systolic function was normal. The estimated ejection fraction was in the range of 55% to 60%. Wall  motion was normal; there were no regional wall motion abnormalities. - Mitral valve: Mild regurgitation. - Left atrium: The atrium was mildly dilated. - Atrial septum: Redundant but no obvious PFO/ASD No defect or patent foramen ovale was identified.   Coronary angiography2014: Coronary dominance:Right   Left Main: Normal   Left Anterior Descending (LAD): Normal in size with minor irregularities.  1st diagonal (D1): Normal in size with no significant disease.  2nd diagonal (D2): Medium in size with no significant disease.  3rd diagonal (D3): Small in size with no significant disease.  Circumflex (LCx): Normal in size with minor irregularities proximally. There is a 99% hazy stenosis in the midsegment which is likely the culprit for inferior ST elevation MI. The stenosis is at the origin of OM 2.  1st obtuse marginal: Small in size with minor irregularities.  2nd obtuse marginal: Normal in size with minor irregularities.  3rd obtuse marginal: Normal in size with no significant disease.  Right Coronary Artery: normal in size with no significant disease.  Posterior descending artery: Normal  Posterior AV segment: Small in size with no significant disease.  Posterolateral branchs: Small and normal posterolateral branches.  Left ventriculography: Left ventricular systolic function is normal , LVEF is  estimated at 55 %, there is no significant mitral regurgitation   PCI Note:Following the diagnostic procedure, the decision was made to proceed with PCI. Weight-based bivalirudin was given for anticoagulation. Once a therapeutic ACT was achieved, a 6 [place] XB 3.0 guide catheter was inserted. A intuition coronary guidewire was used to cross the lesion. The lesion was predilated with a 2.5 x 12 balloon. The lesion was then stented with a 2.5 x 18 mm Xience expedition drug-eluting stent. There was a significant vessel mismatch between the proximal and the distal segment of the stent. The stent was postdilated with a 3.0 x 12 noncompliant balloon. Following PCI, there was 0% residual stenosis and TIMI-3 flow. Final angiography confirmed an excellent result. The patient tolerated the procedure well. There were no immediate procedural complications. A TR band was used for radial hemostasis. The patient was transferred to the post catheterization recovery area for further monitoring.  PCI Data: Vessel - left circumflex artery/Segment - mid Percent Stenosis (pre) 99% TIMI-flow 3 Stent : 2.5 x 18 mm Xience expedition drug-eluting stent postdilated with a 3.0 noncompliant balloon in the mid and proximal segment Percent Stenosis (post) 0% TIMI-flow (post) 3   Final Conclusions:  1.Severe one-vessel coronary artery disease with 99% hazy mid left circumflex stenosis which is the culprit for inferior ST elevation MI. 2. Normal LV systolic function and mildly elevated left ventricular end-diastolic pressure. 3. Successful angioplasty and drug-eluting stent placement to the left circumflex artery.  Recommendations:  Continue dual antiplatelet therapy for at least one year. Aggressive treatment of risk factors is recommended.  Muhammad AridaMD, [facility] 05/15/2013, 2:51 AM   Assessment/Plan:  1. HTN - her BP looks great - no changes made today.   2. CAD:has had prior inferior STEMIin  2014 with PCI + DES to Lcx. Single vessel CAD. EF was normal.She continues to do well. No symptoms. Continue with CV risk factor modification. Remains on chronic DAPT therapy which Dr. Aundra Dubin had wanted to continue as long as no bleeding issues. Recheck her lab today.    3. T2DM:followed by Endocrine  4. Lipids:remains on statin - lab today.    5. Hypothyroidism - not discussed  6. Mild anemia- no active bleeding. Rechecking lab today.   Current medicines are  reviewed with the patient today.  The patient does not have concerns regarding medicines other than what has been noted above.  The following changes have been made:  See above.  Labs/ tests ordered today include:   No orders of the defined types were placed in this encounter.    Disposition:   FU with me in 6 months.   Patient is agreeable to this plan and will call if any problems develop in the interim.   SignedTruitt Merle, NP  05/25/2018 9:24 AM  [facility] [address] [place],   [address] Phone: [phone] Fax: [phone]

## 2018-05-25 NOTE — Telephone Encounter (Signed)
Called pt re: lab results below. Left a message for pt to call back.

## 2018-05-25 NOTE — Telephone Encounter (Signed)
-----   Message from Burtis Junes, NP sent at 05/25/2018  4:00 PM EST ----- Ok to report. Would speak to her son - her chemistries are stable. Her blood count continues to drop - now HGB is 8.8. Looks iron deficient.  She needs GI referral - Dr. Carlean Purl if she does not have anyone.  Would hold Plavix for now.  Recheck CBC again prior to leaving the country.  Stay on Protonix

## 2018-05-25 NOTE — Patient Instructions (Addendum)
We will be checking the following labs today - BMET, CBC, HPF, Lipids    Medication Instructions:    Continue with your current medicines.  I sent in all the refills today    If you need a refill on your cardiac medications before your next appointment, please call your pharmacy.     Testing/Procedures To Be Arranged:  N/A  Follow-Up:   See me in 6 months    At [facility], you and your health needs are our priority.  As part of our continuing mission to provide you with exceptional heart care, we have created designated Provider Care Teams.  These Care Teams include your primary Cardiologist (physician) and Advanced Practice Providers (APPs -  Physician Assistants and Nurse Practitioners) who all work together to provide you with the care you need, when you need it.  Special Instructions:  . None  Call the [place] office at [phone] if you have any questions, problems or concerns.

## 2018-05-25 NOTE — Telephone Encounter (Signed)
Patient's son returned call for lab results.  

## 2018-05-26 NOTE — Telephone Encounter (Signed)
Follow up   Patient's son is returning call for lab results.

## 2018-05-26 NOTE — Telephone Encounter (Signed)
Returned call to pt's son, DPR on file. Left a message for him to call back.

## 2018-05-27 ENCOUNTER — Encounter: Payer: Self-pay | Admitting: Internal Medicine

## 2018-05-28 ENCOUNTER — Encounter: Payer: Self-pay | Admitting: *Deleted

## 2018-05-31 ENCOUNTER — Encounter: Payer: Self-pay | Admitting: Internal Medicine

## 2018-05-31 ENCOUNTER — Ambulatory Visit: Payer: Medicaid Other | Admitting: Internal Medicine

## 2018-05-31 VITALS — BP 140/77 | HR 78 | Ht 62.0 in | Wt 132.6 lb

## 2018-05-31 DIAGNOSIS — K219 Gastro-esophageal reflux disease without esophagitis: Secondary | ICD-10-CM

## 2018-05-31 DIAGNOSIS — D509 Iron deficiency anemia, unspecified: Secondary | ICD-10-CM | POA: Diagnosis not present

## 2018-05-31 MED ORDER — SUPREP BOWEL PREP KIT 17.5-3.13-1.6 GM/177ML PO SOLN: 1.0000 | ORAL | 0 refills | Status: DC

## 2018-05-31 NOTE — Progress Notes (Signed)
Patient ID: [name] [name], female   DOB: July 22, 1955, 62 y.o.   MRN: [id number] HPI: [name] [name] is a 62 year old female with a past medical history of CAD status post PCI in 2004 previously on Plavix, hypertension, hyperlipidemia, diabetes, hypothyroidism who is seen in consultation at the request of Dr. Dwyane Dee to evaluate iron deficiency anemia.  She is here today with her son.  Her son does most of the translation.  She reports being diagnosed with anemia which is been progressive.  Her Plavix was stopped several weeks ago by cardiology when she was found to have worsening anemia.  Hemoglobin down to the mid 8 range.  Microcytic.  Oral iron has not been started.  Patient denies specific GI complaint but on questioning does report some gas pressure and gurgling occasionally in the afternoon.  She will occasionally have loose stools but she denies a change in bowel habit.  Denies frequent diarrhea or constipation.  Denies visible blood in her stool or melena.  Denies weight loss.  Reports a good appetite with no nausea or vomiting.  Denies dysphagia and odynophagia.  She reports a history of GERD and takes pantoprazole 40 mg daily.  Family history notable for sister with breast cancer.  They deny a family history of GI tract malignancy.  She has never had a colonoscopy.  She did have fecal occult blood testing performed at [facility] in September which was negative.  Her son notes that she had multiple teeth extracted last year and she did have significant bleeding in the days in a few weeks after dental extraction.  No recent gingival bleeding.  She will be traveling to [place] from January 7 through August 08, 2018.  Past Medical History:  Diagnosis Date  . Anemia   . CAD (coronary artery disease)    a. 04/2013 Inf STEMI/PCI: LM nl, LAD min irregs, D1/2/3 nl, LCX 3m (2.5x18 Xience DES), OM1/2/3 nl, RCA nl, PDA nl, RPAV nl, RPL nl, EF 55%;  b. 04/2013 Echo: EF 55-60%, no rwma, mild MR,  mildly dil LA.  . Diabetes mellitus type 2, uncontrolled ([place])    a. 04/2013 HbA1c = 11.7.  . Hyperlipidemia   . Hypertension   . Hypothyroidism     Past Surgical History:  Procedure Laterality Date  . CESAREAN SECTION    . LEFT HEART CATH  05-15-13  . LEFT HEART CATHETERIZATION WITH CORONARY ANGIOGRAM N/A 05/15/2013   Procedure: LEFT HEART CATHETERIZATION WITH CORONARY ANGIOGRAM;  Surgeon: Wellington [place], MD;  Location: [facility] C[facility];  Service: Cardiovascular;  Laterality: N/A;  . PERCUTANEOUS STENT INTERVENTION  05/15/2013   Procedure: PERCUTANEOUS STENT INTERVENTION;  Surgeon: Wellington [place], MD;  Location: [facility] CATH LAB;  Service: Cardiovascular;;    Outpatient Medications Prior to Visit  Medication Sig Dispense Refill  . acarbose (PRECOSE) 25 MG tablet TAKE 1 TABLET BY MOUTH AT BREAKFAST THEN TAKE 2 TABLETS BY MOUTH AT LUNCH AND 1 TABLET BY MOUTH AT DINNER 360 tablet 1  . aspirin EC 81 MG tablet Take 1 tablet (81 mg total) by mouth daily.    Marland Kitchen atorvastatin (LIPITOR) 80 MG tablet Take 1 tablet (80 mg total) by mouth daily at 6 PM. 90 tablet 3  . canagliflozin (INVOKANA) 100 MG TABS tablet Take 1 tablet (100 mg total) by mouth daily. 90 tablet 1  . gabapentin (NEURONTIN) 100 MG capsule TAKE 2 CAPSULES (200 MG TOTAL) BY MOUTH AT BEDTIME. 60 capsule 3  . glucose blood (ACCU-CHEK SMARTVIEW) test strip  UAD tid to monitor glucose; Dx: E11.65 300 each PRN  . levothyroxine (SYNTHROID, LEVOTHROID) 25 MCG tablet Take 1 tablet (25 mcg total) by mouth daily before breakfast. 90 tablet 3  . lisinopril (PRINIVIL,ZESTRIL) 20 MG tablet Take 1 tablet (20 mg total) by mouth daily. 90 tablet 3  . metFORMIN (GLUCOPHAGE-XR) 500 MG 24 hr tablet TAKE 4 TABLETS BY MOUTH DAILY WITH SUPPER 360 tablet 1  . metoprolol tartrate (LOPRESSOR) 25 MG tablet TAKE ONE-HALF TABLET (12.5 MG TOTAL) BY MOUTH 2 (TWO) TIMES DAILY 90 tablet 3  . nitroGLYCERIN (NITROSTAT) 0.4 MG SL tablet Place 1 tablet (0.4 mg total) under  the tongue every 5 (five) minutes as needed for chest pain. 25 tablet 3  . pantoprazole (PROTONIX) 40 MG tablet Take 1 tablet (40 mg total) by mouth daily. 90 tablet 3   No facility-administered medications prior to visit.     No Known Allergies  Family History  Problem Relation Age of Onset  . Heart disease Mother   . Diabetes Mother   . Hypertension Mother   . Heart disease Father   . Diabetes Father   . Hypertension Father   . Diabetes Brother     Social History   Tobacco Use  . Smoking status: Never Smoker  . Smokeless tobacco: Never Used  Substance Use Topics  . Alcohol use: No  . Drug use: No    ROS: As per history of present illness, otherwise negative  BP 140/77   Pulse 78   Ht 5\' 2"  (1.575 m)   Wt 132 lb 9.6 oz (60.1 kg)   SpO2 98%   BMI 24.25 kg/m  Constitutional: Well-developed and well-nourished. No distress. HEENT: Normocephalic and atraumatic.  Conjunctivae are normal.  No scleral icterus. Neck: Neck supple. Trachea midline. Cardiovascular: Normal rate, regular rhythm and intact distal pulses. No M/R/G Pulmonary/chest: Effort normal and breath sounds normal. No wheezing, rales or rhonchi. Abdominal: Soft, nontender, nondistended. Bowel sounds active throughout. There are no masses palpable. No hepatosplenomegaly. Extremities: no clubbing, cyanosis, or edema Neurological: Alert and oriented to person place and time. Skin: Skin is warm and dry.  Psychiatric: Normal mood and affect. Behavior is normal.  RELEVANT LABS AND IMAGING: CBC    Component Value Date/Time   WBC 6.7 05/25/2018 0932   WBC 6.1 08/03/2014 1046   RBC 4.18 05/25/2018 0932   RBC 4.42 08/03/2014 1046   HGB 8.8 (L) 05/25/2018 0932   HCT 30.4 (L) 05/25/2018 0932   PLT 384 05/25/2018 0932   MCV 73 (L) 05/25/2018 0932   MCH 21.1 (L) 05/25/2018 0932   MCH 29.2 06/04/2013 2155   MCHC 28.9 (L) 05/25/2018 0932   MCHC 34.1 08/03/2014 1046   RDW 17.7 (H) 05/25/2018 0932   LYMPHSABS  1.5 08/03/2014 1046   MONOABS 0.5 08/03/2014 1046   EOSABS 0.1 08/03/2014 1046   BASOSABS 0.0 08/03/2014 1046    CMP     Component Value Date/Time   NA 138 05/25/2018 0932   K 4.5 05/25/2018 0932   CL 99 05/25/2018 0932   CO2 20 05/25/2018 0932   GLUCOSE 128 (H) 05/25/2018 0932   GLUCOSE 132 (H) 04/26/2018 0827   BUN 10 05/25/2018 0932   CREATININE 0.74 05/25/2018 0932   CALCIUM 10.1 05/25/2018 0932   PROT 6.8 05/25/2018 0932   ALBUMIN 4.4 05/25/2018 0932   AST 14 05/25/2018 0932   ALT 10 05/25/2018 0932   ALKPHOS 44 05/25/2018 0932   BILITOT 0.3 05/25/2018 0932  GFRNONAA 86 05/25/2018 0932   GFRAA 99 05/25/2018 0932    ASSESSMENT/PLAN: 62 year old female with a past medical history of CAD status post PCI in 2004 previously on Plavix, hypertension, hyperlipidemia, diabetes, hypothyroidism who is seen in consultation at the request of Dr. Dwyane Dee to evaluate iron deficiency anemia.   1. IDA --microcytic anemia, presumed low iron.  This has been progressive.  Will check ferritin and IBC panel today.  I recommended upper endoscopy and colonoscopy for further evaluation.  We discussed the risk, benefits and alternatives and she is agreeable and wishes to proceed.  We can accommodate these procedures before she leaves to return to [place] for 6 or 7 weeks early in 2020.  She will remain off Plavix per direction of cardiology until these procedures are completed.  2.  GERD --stable.  We will continue pantoprazole 40 mg daily at this time    RJ:[address], [address] [place], [place] [address]

## 2018-05-31 NOTE — Patient Instructions (Signed)
You have been scheduled for an endoscopy and colonoscopy. Please follow the written instructions given to you at your visit today. Please pick up your prep supplies at the pharmacy within the next 1-3 days. If you use inhalers (even only as needed), please bring them with you on the day of your procedure. Your physician has requested that you go to www.startemmi.com and enter the access code given to you at your visit today. This web site gives a general overview about your procedure. However, you should still follow specific instructions given to you by our office regarding your preparation for the procedure.  If you are age 86 or older, your body mass index should be between 23-30. Your Body mass index is 24.25 kg/m. If this is out of the aforementioned range listed, please consider follow up with your Primary Care Provider.  If you are age 65 or younger, your body mass index should be between 19-25. Your Body mass index is 24.25 kg/m. If this is out of the aformentioned range listed, please consider follow up with your Primary Care Provider.

## 2018-06-01 ENCOUNTER — Other Ambulatory Visit: Payer: Medicaid Other

## 2018-06-04 ENCOUNTER — Ambulatory Visit: Payer: Medicaid Other | Admitting: Endocrinology

## 2018-06-04 ENCOUNTER — Other Ambulatory Visit: Payer: Self-pay | Admitting: Endocrinology

## 2018-06-11 ENCOUNTER — Telehealth: Payer: Self-pay | Admitting: Nurse Practitioner

## 2018-06-11 NOTE — Telephone Encounter (Signed)
Returned son's call and he has been made aware that she will have a prescription for a blood pressure cuff at the front desk for them to pick up on 06/14/18. Pt thanked me for the call.

## 2018-06-11 NOTE — Telephone Encounter (Signed)
  Son called and asked to speak with Jeanann Lewandowsky because they had previously discussed getting a prescription for a BP machine. He would like to pick that up when patient comes 06/14/18 @ 12 pm to do blood work.

## 2018-06-14 ENCOUNTER — Other Ambulatory Visit: Payer: Medicaid Other

## 2018-06-15 ENCOUNTER — Other Ambulatory Visit: Payer: Medicaid Other | Admitting: *Deleted

## 2018-06-15 DIAGNOSIS — D649 Anemia, unspecified: Secondary | ICD-10-CM

## 2018-06-15 LAB — CBC
Hematocrit: 30.6 % — ABNORMAL LOW (ref 34.0–46.6)
Hemoglobin: 8.8 g/dL — ABNORMAL LOW (ref 11.1–15.9)
MCH: 20.5 pg — ABNORMAL LOW (ref 26.6–33.0)
MCHC: 28.8 g/dL — ABNORMAL LOW (ref 31.5–35.7)
MCV: 71 fL — ABNORMAL LOW (ref 79–97)
Platelets: 387 10*3/uL (ref 150–450)
RBC: 4.29 x10E6/uL (ref 3.77–5.28)
RDW: 17.3 % — ABNORMAL HIGH (ref 12.3–15.4)
WBC: 5.5 10*3/uL (ref 3.4–10.8)

## 2018-06-17 ENCOUNTER — Other Ambulatory Visit: Payer: Self-pay

## 2018-06-17 ENCOUNTER — Encounter: Payer: Self-pay | Admitting: Internal Medicine

## 2018-06-17 ENCOUNTER — Ambulatory Visit (AMBULATORY_SURGERY_CENTER): Payer: Medicaid Other | Admitting: Internal Medicine

## 2018-06-17 ENCOUNTER — Other Ambulatory Visit (INDEPENDENT_AMBULATORY_CARE_PROVIDER_SITE_OTHER): Payer: Medicaid Other

## 2018-06-17 VITALS — BP 126/68 | HR 75 | Temp 98.6°F | Resp 24

## 2018-06-17 DIAGNOSIS — D125 Benign neoplasm of sigmoid colon: Secondary | ICD-10-CM

## 2018-06-17 DIAGNOSIS — K2950 Unspecified chronic gastritis without bleeding: Secondary | ICD-10-CM | POA: Diagnosis not present

## 2018-06-17 DIAGNOSIS — D128 Benign neoplasm of rectum: Secondary | ICD-10-CM | POA: Diagnosis not present

## 2018-06-17 DIAGNOSIS — E119 Type 2 diabetes mellitus without complications: Secondary | ICD-10-CM | POA: Diagnosis not present

## 2018-06-17 DIAGNOSIS — K2980 Duodenitis without bleeding: Secondary | ICD-10-CM

## 2018-06-17 DIAGNOSIS — D509 Iron deficiency anemia, unspecified: Secondary | ICD-10-CM

## 2018-06-17 DIAGNOSIS — D127 Benign neoplasm of rectosigmoid junction: Secondary | ICD-10-CM

## 2018-06-17 DIAGNOSIS — I2510 Atherosclerotic heart disease of native coronary artery without angina pectoris: Secondary | ICD-10-CM | POA: Diagnosis not present

## 2018-06-17 DIAGNOSIS — I10 Essential (primary) hypertension: Secondary | ICD-10-CM | POA: Diagnosis not present

## 2018-06-17 DIAGNOSIS — K219 Gastro-esophageal reflux disease without esophagitis: Secondary | ICD-10-CM | POA: Diagnosis not present

## 2018-06-17 DIAGNOSIS — D129 Benign neoplasm of anus and anal canal: Secondary | ICD-10-CM

## 2018-06-17 DIAGNOSIS — D122 Benign neoplasm of ascending colon: Secondary | ICD-10-CM | POA: Diagnosis not present

## 2018-06-17 DIAGNOSIS — D124 Benign neoplasm of descending colon: Secondary | ICD-10-CM

## 2018-06-17 LAB — IBC PANEL
Iron: 32 ug/dL — ABNORMAL LOW (ref 42–145)
SATURATION RATIOS: 5.6 % — AB (ref 20.0–50.0)
Transferrin: 411 mg/dL — ABNORMAL HIGH (ref 212.0–360.0)

## 2018-06-17 LAB — FERRITIN: Ferritin: 5.7 ng/mL — ABNORMAL LOW (ref 10.0–291.0)

## 2018-06-17 MED ORDER — SODIUM CHLORIDE 0.9 % IV SOLN: 500.0000 mL | Freq: Once | INTRAVENOUS | Status: DC

## 2018-06-17 NOTE — Progress Notes (Signed)
Interpreter used today at the [facility] for this pt.  Interpreter's name is- Reymundo Poll

## 2018-06-17 NOTE — Op Note (Signed)
[place] Patient Name: [name] [name] Procedure Date: 06/17/2018 1:16 PM MRN: [id number] Endoscopist: Jerene Bears , MD Age: 63 Referring MD:  Date of Birth: December 27, 1955 Gender: Female Account #: [id number] Procedure:                Upper GI endoscopy Indications:              Iron deficiency anemia Medicines:                Monitored Anesthesia Care Procedure:                Pre-Anesthesia Assessment:                           - Prior to the procedure, a History and Physical                            was performed, and patient medications and                            allergies were reviewed. The patient's tolerance of                            previous anesthesia was also reviewed. The risks                            and benefits of the procedure and the sedation                            options and risks were discussed with the patient.                            All questions were answered, and informed consent                            was obtained. Prior Anticoagulants: The patient has                            taken no previous anticoagulant or antiplatelet                            agents. ASA Grade Assessment: II - A patient with                            mild systemic disease. After reviewing the risks                            and benefits, the patient was deemed in                            satisfactory condition to undergo the procedure.                           After obtaining informed consent, the endoscope was  passed under direct vision. Throughout the                            procedure, the patient's blood pressure, pulse, and                            oxygen saturations were monitored continuously. The                            Endoscope was introduced through the mouth, and                            advanced to the second part of duodenum. The upper                            GI endoscopy was accomplished  without difficulty.                            The patient tolerated the procedure well. Scope In: Scope Out: Findings:                 The esophagus was normal.                           The stomach was normal.                           The examined duodenum was normal.                           Biopsies were taken with a cold forceps in the                            gastric body, at the incisura and in the gastric                            antrum for histology and Helicobacter pylori                            testing.                           Biopsies for histology were taken with a cold                            forceps in the second portion of the duodenum for                            evaluation of celiac disease. Complications:            No immediate complications. Estimated Blood Loss:     Estimated blood loss was minimal. Impression:               - Normal esophagus.                           -  Normal stomach.                           - Normal examined duodenum.                           - Biopsies were taken with a cold forceps for                            histology and Helicobacter pylori testing.                           - Biopsies were taken with a cold forceps for                            evaluation of celiac disease. Recommendation:           - Patient has a contact number available for                            emergencies. The signs and symptoms of potential                            delayed complications were discussed with the                            patient. Return to normal activities tomorrow.                            Written discharge instructions were provided to the                            patient.                           - Resume previous diet.                           - Continue present medications.                           - Await pathology results.                           - See the other procedure note for documentation of                             additional recommendations. Jerene Bears, MD 06/17/2018 2:13:45 PM This report has been signed electronically.

## 2018-06-17 NOTE — Op Note (Signed)
[place] Patient Name: [name] [name] Procedure Date: 06/17/2018 1:16 PM MRN: [id number] Endoscopist: Jerene Bears , MD Age: 63 Referring MD:  Date of Birth: [birth year] Gender: Female Account #: [id number] Procedure:                Colonoscopy Indications:              Iron deficiency anemia Medicines:                Monitored Anesthesia Care Procedure:                Pre-Anesthesia Assessment:                           - Prior to the procedure, a History and Physical                            was performed, and patient medications and                            allergies were reviewed. The patient's tolerance of                            previous anesthesia was also reviewed. The risks                            and benefits of the procedure and the sedation                            options and risks were discussed with the patient.                            All questions were answered, and informed consent                            was obtained. Prior Anticoagulants: The patient has                            taken no previous anticoagulant or antiplatelet                            agents. ASA Grade Assessment: II - A patient with                            mild systemic disease. After reviewing the risks                            and benefits, the patient was deemed in                            satisfactory condition to undergo the procedure.                           After obtaining informed consent, the colonoscope  was passed under direct vision. Throughout the                            procedure, the patient's blood pressure, pulse, and                            oxygen saturations were monitored continuously. The                            Colonoscope was introduced through the anus and                            advanced to the terminal ileum. The colonoscopy was                            performed without difficulty. The  patient tolerated                            the procedure well. The quality of the bowel                            preparation was good. The terminal ileum, ileocecal                            valve, appendiceal orifice, and rectum were                            photographed. Scope In: 1:50:02 PM Scope Out: 2:07:25 PM Scope Withdrawal Time: 0 hours 13 minutes 45 seconds  Total Procedure Duration: 0 hours 17 minutes 23 seconds  Findings:                 The digital rectal exam was normal.                           The terminal ileum appeared normal.                           Four sessile polyps were found in the rectum,                            recto-sigmoid colon, descending colon and ascending                            colon. The polyps were 3 to 6 mm in size. These                            polyps were removed with a cold snare. Resection                            and retrieval were complete.                           The exam was otherwise without abnormality on  direct and retroflexion views. Complications:            No immediate complications. Estimated Blood Loss:     Estimated blood loss was minimal. Impression:               - The examined portion of the ileum was normal.                           - Four 3 to 6 mm polyps in the rectum, at the                            recto-sigmoid colon, in the descending colon and in                            the ascending colon, removed with a cold snare.                            Resected and retrieved.                           - The examination was otherwise normal on direct                            and retroflexion views. Recommendation:           - Patient has a contact number available for                            emergencies. The signs and symptoms of potential                            delayed complications were discussed with the                            patient. Return to normal  activities tomorrow.                            Written discharge instructions were provided to the                            patient.                           - Continue present medications.                           - Check ferritin and IBC panel today. Based on                            these results and pathology results video capsule                            endoscopy will be considered to examine the                            remaining  small bowel.                           - Await pathology results.                           - Repeat colonoscopy is recommended. The                            colonoscopy date will be determined after pathology                            results from today's exam become available for                            review. Jerene Bears, MD 06/17/2018 2:17:24 PM This report has been signed electronically.

## 2018-06-17 NOTE — Patient Instructions (Signed)
YOU HAD AN ENDOSCOPIC PROCEDURE TODAY AT [facility]:   Refer to the procedure report that was given to you for any specific questions about what was found during the examination.  If the procedure report does not answer your questions, please call your gastroenterologist to clarify.  If you requested that your care partner not be given the details of your procedure findings, then the procedure report has been included in a sealed envelope for you to review at your convenience later.  YOU SHOULD EXPECT: Some feelings of bloating in the abdomen. Passage of more gas than usual.  Walking can help get rid of the air that was put into your GI tract during the procedure and reduce the bloating. If you had a lower endoscopy (such as a colonoscopy or flexible sigmoidoscopy) you may notice spotting of blood in your stool or on the toilet paper. If you underwent a bowel prep for your procedure, you may not have a normal bowel movement for a few days.  Please Note:  You might notice some irritation and congestion in your nose or some drainage.  This is from the oxygen used during your procedure.  There is no need for concern and it should clear up in a day or so.  SYMPTOMS TO REPORT IMMEDIATELY:   Following lower endoscopy (colonoscopy or flexible sigmoidoscopy):  Excessive amounts of blood in the stool  Significant tenderness or worsening of abdominal pains  Swelling of the abdomen that is new, acute  Fever of 100F or higher   Following upper endoscopy (EGD)  Vomiting of blood or coffee ground material  New chest pain or pain under the shoulder blades  Painful or persistently difficult swallowing  New shortness of breath  Fever of 100F or higher  Black, tarry-looking stools  For urgent or emergent issues, a gastroenterologist can be reached at any hour by calling [phone].   DIET:  We do recommend a small meal at first, but then you may proceed to your regular diet.  Drink  plenty of fluids but you should avoid alcoholic beverages for 24 hours.  ACTIVITY:  You should plan to take it easy for the rest of today and you should NOT DRIVE or use heavy machinery until tomorrow (because of the sedation medicines used during the test).    FOLLOW UP: Our staff will call the number listed on your records the next business day following your procedure to check on you and address any questions or concerns that you may have regarding the information given to you following your procedure. If we do not reach you, we will leave a message.  However, if you are feeling well and you are not experiencing any problems, there is no need to return our call.  We will assume that you have returned to your regular daily activities without incident.  If any biopsies were taken you will be contacted by phone or by letter within the next 1-3 weeks.  Please call us at [phone] if you have not heard about the biopsies in 3 weeks.    SIGNATURES/CONFIDENTIALITY: You and/or your care partner have signed paperwork which will be entered into your electronic medical record.  These signatures attest to the fact that that the information above on your After Visit Summary has been reviewed and is understood.  Full responsibility of the confidentiality of this discharge information lies with you and/or your care-partner.  Go to the lab today for bloodwork.  Read all handouts  given to you by your recovery room nurse.

## 2018-06-17 NOTE — Progress Notes (Signed)
Report to PACU, RN, vss, BBS= Clear.  

## 2018-06-17 NOTE — Progress Notes (Signed)
Called to room to assist during endoscopic procedure.  Patient ID and intended procedure confirmed with present staff. Received instructions for my participation in the procedure from the performing physician.  

## 2018-06-17 NOTE — Progress Notes (Signed)
Pt's states no medical or surgical changes since previsit or office visit. 

## 2018-06-18 ENCOUNTER — Other Ambulatory Visit: Payer: Self-pay

## 2018-06-18 ENCOUNTER — Telehealth: Payer: Self-pay | Admitting: *Deleted

## 2018-06-18 DIAGNOSIS — D509 Iron deficiency anemia, unspecified: Secondary | ICD-10-CM

## 2018-06-18 MED ORDER — FERROUS SULFATE 325 (65 FE) MG PO TABS: 325.0000 mg | ORAL_TABLET | Freq: Every day | ORAL | 3 refills | Status: DC

## 2018-06-18 MED ORDER — DOCUSATE SODIUM 100 MG PO CAPS: ORAL_CAPSULE | ORAL | 3 refills | Status: DC

## 2018-06-18 NOTE — Telephone Encounter (Signed)
  Follow up Call-  Call back number 06/17/2018  Post procedure Call Back phone  # [phone]  Permission to leave phone message Yes  Some recent data might be hidden     Patient questions:  Do you have a fever, pain , or abdominal swelling? No. Pain Score  0 *  Have you tolerated food without any problems? Yes.    Have you been able to return to your normal activities? Yes.    Do you have any questions about your discharge instructions: Diet   No. Medications  No. Follow up visit  No.  Do you have questions or concerns about your Care? No.  Actions: * If pain score is 4 or above: No action needed, pain <4.

## 2018-06-18 NOTE — Telephone Encounter (Signed)
Left message for DPR (pt's son) to go over results.

## 2018-06-18 NOTE — Telephone Encounter (Signed)
DPR ok to s/w pt's son who has been notified of pt's alb results by phone with verbal understanding. Advised pt's son for the pt to remain off of Plavix. Hgb nno change from previous lab, pt still anemic. I will fax labs to both Dr. Hilarie Fredrickson and Dr. Dwyane Dee. Pt son thanked me for the call.

## 2018-07-27 ENCOUNTER — Telehealth: Payer: Self-pay | Admitting: Internal Medicine

## 2018-07-27 ENCOUNTER — Other Ambulatory Visit: Payer: Self-pay

## 2018-07-27 DIAGNOSIS — A048 Other specified bacterial intestinal infections: Secondary | ICD-10-CM

## 2018-07-27 MED ORDER — BIS SUBCIT-METRONID-TETRACYC 140-125-125 MG PO CAPS: 3.0000 | ORAL_CAPSULE | Freq: Three times a day (TID) | ORAL | 0 refills | Status: DC

## 2018-07-27 NOTE — Telephone Encounter (Signed)
I spoke with the patient's son He is notified of the results and recommendations See pathology report for additional details Needs Pylera, BID PPI while on Pylera, and stool antigen to confirm eradication. Patient's son notified to have her start Pylera and increase pantoprazole to BID for those 10 days, return to daily for 4 weeks then stop for two and come for labs

## 2018-08-09 ENCOUNTER — Other Ambulatory Visit: Payer: Medicaid Other

## 2018-08-10 ENCOUNTER — Other Ambulatory Visit (INDEPENDENT_AMBULATORY_CARE_PROVIDER_SITE_OTHER): Payer: Medicaid Other

## 2018-08-10 ENCOUNTER — Telehealth: Payer: Self-pay | Admitting: Internal Medicine

## 2018-08-10 DIAGNOSIS — D509 Iron deficiency anemia, unspecified: Secondary | ICD-10-CM

## 2018-08-10 DIAGNOSIS — E1165 Type 2 diabetes mellitus with hyperglycemia: Secondary | ICD-10-CM

## 2018-08-10 DIAGNOSIS — E063 Autoimmune thyroiditis: Secondary | ICD-10-CM

## 2018-08-10 LAB — URINALYSIS, ROUTINE W REFLEX MICROSCOPIC
Bilirubin Urine: NEGATIVE
KETONES UR: NEGATIVE
LEUKOCYTE UA: NEGATIVE
Nitrite: NEGATIVE
Specific Gravity, Urine: 1.02 (ref 1.000–1.030)
Total Protein, Urine: NEGATIVE
URINE GLUCOSE: NEGATIVE
UROBILINOGEN UA: 0.2 (ref 0.0–1.0)
pH: 6 (ref 5.0–8.0)

## 2018-08-10 LAB — CBC WITH DIFFERENTIAL/PLATELET
Basophils Absolute: 0 10*3/uL (ref 0.0–0.1)
Basophils Relative: 0.5 % (ref 0.0–3.0)
Eosinophils Absolute: 0.1 10*3/uL (ref 0.0–0.7)
Eosinophils Relative: 2 % (ref 0.0–5.0)
HCT: 34.3 % — ABNORMAL LOW (ref 36.0–46.0)
Hemoglobin: 10.9 g/dL — ABNORMAL LOW (ref 12.0–15.0)
LYMPHS ABS: 1.8 10*3/uL (ref 0.7–4.0)
Lymphocytes Relative: 25.9 % (ref 12.0–46.0)
MCHC: 31.8 g/dL (ref 30.0–36.0)
MCV: 74.7 fl — ABNORMAL LOW (ref 78.0–100.0)
Monocytes Absolute: 0.6 10*3/uL (ref 0.1–1.0)
Monocytes Relative: 9.1 % (ref 3.0–12.0)
Neutro Abs: 4.3 10*3/uL (ref 1.4–7.7)
Neutrophils Relative %: 62.5 % (ref 43.0–77.0)
Platelets: 378 10*3/uL (ref 150.0–400.0)
RBC: 4.59 Mil/uL (ref 3.87–5.11)
RDW: 24.6 % — AB (ref 11.5–15.5)
WBC: 6.8 10*3/uL (ref 4.0–10.5)

## 2018-08-10 LAB — COMPREHENSIVE METABOLIC PANEL
ALBUMIN: 4.2 g/dL (ref 3.5–5.2)
ALK PHOS: 43 U/L (ref 39–117)
ALT: 8 U/L (ref 0–35)
AST: 12 U/L (ref 0–37)
BUN: 17 mg/dL (ref 6–23)
CALCIUM: 9.7 mg/dL (ref 8.4–10.5)
CO2: 27 mEq/L (ref 19–32)
Chloride: 103 mEq/L (ref 96–112)
Creatinine, Ser: 0.59 mg/dL (ref 0.40–1.20)
GFR: 102.54 mL/min (ref >60.00)
Glucose, Bld: 120 mg/dL — ABNORMAL HIGH (ref 70–99)
Potassium: 4.5 mEq/L (ref 3.5–5.1)
Sodium: 138 mEq/L (ref 135–145)
Total Bilirubin: 0.5 mg/dL (ref 0.2–1.2)
Total Protein: 6.9 g/dL (ref 6.0–8.3)

## 2018-08-10 LAB — HEMOGLOBIN A1C: HEMOGLOBIN A1C: 6.9 % — AB (ref 4.6–6.5)

## 2018-08-10 LAB — IBC PANEL
IRON: 31 ug/dL — AB (ref 42–145)
Saturation Ratios: 5.7 % — ABNORMAL LOW (ref 20.0–50.0)
Transferrin: 388 mg/dL — ABNORMAL HIGH (ref 212.0–360.0)

## 2018-08-10 LAB — MICROALBUMIN / CREATININE URINE RATIO
Creatinine,U: 85.4 mg/dL
Microalb Creat Ratio: 2.7 mg/g (ref 0.0–30.0)
Microalb, Ur: 2.3 mg/dL — ABNORMAL HIGH (ref 0.0–1.9)

## 2018-08-10 LAB — TSH: TSH: 2.18 u[IU]/mL (ref 0.35–4.50)

## 2018-08-10 LAB — FERRITIN: Ferritin: 4.7 ng/mL — ABNORMAL LOW (ref 10.0–291.0)

## 2018-08-10 NOTE — Telephone Encounter (Signed)
Patient's son has again been instructed to have patient take Pylera 3 capsules 4 times daily and pantoprazole twice daily x 10 days, then decrease pantoprazole to once daily x 30 days, then discontinue pantoprazole x 14 days, then complete h pylori by stool antigen. He verbalizes understanding.

## 2018-08-11 ENCOUNTER — Telehealth: Payer: Self-pay | Admitting: Internal Medicine

## 2018-08-11 NOTE — Telephone Encounter (Signed)
Spoke with pts son and let him know the bismuth in pylera does make the stool a dark black color. Discussed with him the blood when she coughed/blood when she blew her nose and sleeping a lot should not be related to the medication. Encouraged him to contact her PCP regarding these issues. He verbalized understanding.

## 2018-08-12 ENCOUNTER — Ambulatory Visit: Payer: Medicaid Other | Admitting: Endocrinology

## 2018-08-16 ENCOUNTER — Other Ambulatory Visit: Payer: Self-pay

## 2018-08-16 ENCOUNTER — Ambulatory Visit (INDEPENDENT_AMBULATORY_CARE_PROVIDER_SITE_OTHER): Payer: Medicaid Other | Admitting: Endocrinology

## 2018-08-16 ENCOUNTER — Encounter: Payer: Self-pay | Admitting: Endocrinology

## 2018-08-16 VITALS — BP 144/80 | HR 72 | Ht 62.0 in | Wt 131.6 lb

## 2018-08-16 DIAGNOSIS — E1142 Type 2 diabetes mellitus with diabetic polyneuropathy: Secondary | ICD-10-CM

## 2018-08-16 NOTE — Patient Instructions (Signed)
Check BP  Check blood sugars on waking up 2-3 days a week  Also check blood sugars about 2 hours after meals and do this after different meals by rotation  Recommended blood sugar levels on waking up are 90-130 and about 2 hours after meal is 130-160  Please bring your blood sugar monitor to each visit, thank you

## 2018-08-16 NOTE — Progress Notes (Signed)
Patient ID: [name] [name], female   DOB: [birth year], 63 y.o.   MRN: [id number]           Reason for Appointment: Follow-up for Type 2 Diabetes   History of Present Illness:          Diagnosis: Type 2 diabetes mellitus, date of diagnosis: ?  2014   Past history:   Her son thinks that she may have had diabetes diagnosed when sh 0e was in [place] When she moved to [place] in 2014 she was not on any medications She was however found to have significant hyperglycemia with glucose 364 and A1c of 11.7 when she was admitted for her coronary artery disease in 04/2013. At that time she was started on premixed insulin twice a day Her level of control had not been adequate with persistently high A1c readings She did not tolerate Victoza well and was having decreased appetite, malaise and nausea and this was stopped subsequently  Recent history:        Oral hypoglycemic drugs:     Metformin ER 500 mg 4 tablets daily, Invokana 100 mg daily, Precose 25 mg before meals   Her A1c has been as low as 6.9 and is improved  Blood sugars are usually lower than expected for her A1c, and averaging 127, previously 128 at home  Current blood sugar patterns and problems:  Despite her traveling [place] and not watching her diet her A1c is surprisingly better  Also because of her time change some of her readings are difficult to interpret  Until last week she was out of her medications for about a week which also caused her blood sugars to be higher  Otherwise taking acarbose before each meal, 1 tablet without any difficulty  Blood sugars the last 3 mornings have been below 130  Highest reading blood sugar recently is 173, likely after meal last week  She has been variably active and more on her overseas trip, less during winter months  Also no recent weight change  Hypoglycemia: None    Side effects from medications have been: None  Self-care: The diet that the patient has been  following is: None, has vegetarian diet Usually having vegetables and roti with lunch and dinner and sometimes having lentils.   She does not like yogurt as it apparently causes heartburn  Usually not eating fried food except for some snacks            Exercise:  Trying to walk when she can       Dietician visit, none   Compliance with the medical regimen: Fair  Glucose monitoring:  done about 1 times a day, less recently       Glucometer: Accu-Chek  Blood Glucose readings by meter download as above  Average for the last month 135  Weight history:   Wt Readings from Last 3 Encounters:  08/16/18 131 lb 9.6 oz (59.7 kg)  05/31/18 132 lb 9.6 oz (60.1 kg)  05/25/18 134 lb (60.8 kg)    Glycemic control:   Lab Results  Component Value Date   HGBA1C 6.9 (H) 08/10/2018   HGBA1C 7.2 (H) 04/26/2018   HGBA1C 7.3 (H) 03/30/2018   Lab Results  Component Value Date   MICROALBUR 2.3 (H) 08/10/2018   LDLCALC 43 05/25/2018   CREATININE 0.59 08/10/2018    Lab on 08/10/2018  Component Date Value Ref Range Status  . Color, Urine 08/10/2018 YELLOW  Yellow;Lt. Yellow;Straw;Dark Yellow;Amber;Green;Red;Brown Final  . APPearance 08/10/2018 CLEAR  Clear;Turbid;Slightly Cloudy;Cloudy Final  . Specific Gravity, Urine 08/10/2018 1.020  1.000 - 1.030 Final  . pH 08/10/2018 6.0  5.0 - 8.0 Final  . Total Protein, Urine 08/10/2018 NEGATIVE  Negative Final  . Urine Glucose 08/10/2018 NEGATIVE  Negative Final  . Ketones, ur 08/10/2018 NEGATIVE  Negative Final  . Bilirubin Urine 08/10/2018 NEGATIVE  Negative Final  . Hgb urine dipstick 08/10/2018 TRACE-INTACT* Negative Final  . Urobilinogen, UA 08/10/2018 0.2  0.0 - 1.0 Final  . Leukocytes,Ua 08/10/2018 NEGATIVE  Negative Final  . Nitrite 08/10/2018 NEGATIVE  Negative Final  . WBC, UA 08/10/2018 0-2/hpf  0-2/hpf Final  . RBC / HPF 08/10/2018 0-2/hpf  0-2/hpf Final  . Squamous Epithelial / LPF 08/10/2018 Rare(0-4/hpf)  Rare(0-4/hpf) Final  .  Microalb, Ur 08/10/2018 2.3* 0.0 - 1.9 mg/dL Final  . Creatinine,U 08/10/2018 85.4  mg/dL Final  . Microalb Creat Ratio 08/10/2018 2.7  0.0 - 30.0 mg/g Final  . TSH 08/10/2018 2.18  0.35 - 4.50 uIU/mL Final  . Sodium 08/10/2018 138  135 - 145 mEq/L Final  . Potassium 08/10/2018 4.5  3.5 - 5.1 mEq/L Final  . Chloride 08/10/2018 103  96 - 112 mEq/L Final  . CO2 08/10/2018 27  19 - 32 mEq/L Final  . Glucose, Bld 08/10/2018 120* 70 - 99 mg/dL Final  . BUN 08/10/2018 17  6 - 23 mg/dL Final  . Creatinine, Ser 08/10/2018 0.59  0.40 - 1.20 mg/dL Final  . Total Bilirubin 08/10/2018 0.5  0.2 - 1.2 mg/dL Final  . Alkaline Phosphatase 08/10/2018 43  39 - 117 U/L Final  . AST 08/10/2018 12  0 - 37 U/L Final  . ALT 08/10/2018 8  0 - 35 U/L Final  . Total Protein 08/10/2018 6.9  6.0 - 8.3 g/dL Final  . Albumin 08/10/2018 4.2  3.5 - 5.2 g/dL Final  . Calcium 08/10/2018 9.7  8.4 - 10.5 mg/dL Final  . GFR 08/10/2018 102.54  >60.00 mL/min Final  . Hgb A1c MFr Bld 08/10/2018 6.9* 4.6 - 6.5 % Final   Glycemic Control Guidelines for People with Diabetes:Non Diabetic:  <6%Goal of Therapy: <7%Additional Action Suggested:  >8%   Appointment on 08/10/2018  Component Date Value Ref Range Status  . Iron 08/10/2018 31* 42 - 145 ug/dL Final  . Transferrin 08/10/2018 388.0* 212.0 - 360.0 mg/dL Final  . Saturation Ratios 08/10/2018 5.7* 20.0 - 50.0 % Final  . Ferritin 08/10/2018 4.7* 10.0 - 291.0 ng/mL Final  . WBC 08/10/2018 6.8  4.0 - 10.5 K/uL Final  . RBC 08/10/2018 4.59  3.87 - 5.11 Mil/uL Final  . Hemoglobin 08/10/2018 10.9* 12.0 - 15.0 g/dL Final  . HCT 08/10/2018 34.3* 36.0 - 46.0 % Final  . MCV 08/10/2018 74.7* 78.0 - 100.0 fl Final  . MCHC 08/10/2018 31.8  30.0 - 36.0 g/dL Final  . RDW 08/10/2018 24.6* 11.5 - 15.5 % Final  . Platelets 08/10/2018 378.0  150.0 - 400.0 K/uL Final  . Neutrophils Relative % 08/10/2018 62.5  43.0 - 77.0 % Final  . Lymphocytes Relative 08/10/2018 25.9  12.0 - 46.0 % Final  .  Monocytes Relative 08/10/2018 9.1  3.0 - 12.0 % Final  . Eosinophils Relative 08/10/2018 2.0  0.0 - 5.0 % Final  . Basophils Relative 08/10/2018 0.5  0.0 - 3.0 % Final  . Neutro Abs 08/10/2018 4.3  1.4 - 7.7 K/uL Final  . Lymphs Abs 08/10/2018 1.8  0.7 - 4.0 K/uL Final  . Monocytes Absolute 08/10/2018 0.6  0.1 - 1.0 K/uL Final  . Eosinophils Absolute 08/10/2018 0.1  0.0 - 0.7 K/uL Final  . Basophils Absolute 08/10/2018 0.0  0.0 - 0.1 K/uL Final      Allergies as of 08/16/2018   No Known Allergies     Medication List       Accurate as of August 16, 2018 11:48 AM. Always use your most recent med list.        acarbose 25 MG tablet Commonly known as:  PRECOSE TAKE 1 TABLET BY MOUTH AT BREAKFAST THEN TAKE 2 TABLETS BY MOUTH AT LUNCH AND 1 TABLET BY MOUTH AT DINNER   ACCU-CHEK FASTCLIX LANCETS Misc USE TO CHECK BLOOD SUGAR 3 TIMES PER DAY   aspirin EC 81 MG tablet Take 1 tablet (81 mg total) by mouth daily.   atorvastatin 80 MG tablet Commonly known as:  LIPITOR Take 1 tablet (80 mg total) by mouth daily at 6 PM.   bismuth-metronidazole-tetracycline 140-125-125 MG capsule Commonly known as:  PYLERA Take 3 capsules by mouth 4 (four) times daily -  before meals and at bedtime.   canagliflozin 100 MG Tabs tablet Commonly known as:  INVOKANA Take 1 tablet (100 mg total) by mouth daily.   docusate sodium 100 MG capsule Commonly known as:  COLACE Take 1-2 capsules daily as needed for constipation   ferrous sulfate 325 (65 FE) MG tablet Take 1 tablet (325 mg total) by mouth daily with breakfast.   gabapentin 100 MG capsule Commonly known as:  NEURONTIN TAKE 2 CAPSULES (200 MG TOTAL) BY MOUTH AT BEDTIME.   glucose blood test strip Commonly known as:  ACCU-CHEK SMARTVIEW UAD tid to monitor glucose; Dx: E11.65   levothyroxine 25 MCG tablet Commonly known as:  SYNTHROID, LEVOTHROID Take 1 tablet (25 mcg total) by mouth daily before breakfast.   lisinopril 20 MG  tablet Commonly known as:  PRINIVIL,ZESTRIL Take 1 tablet (20 mg total) by mouth daily.   metFORMIN 500 MG 24 hr tablet Commonly known as:  GLUCOPHAGE-XR TAKE 4 TABLETS BY MOUTH DAILY WITH SUPPER   metoprolol tartrate 25 MG tablet Commonly known as:  LOPRESSOR TAKE ONE-HALF TABLET (12.5 MG TOTAL) BY MOUTH 2 (TWO) TIMES DAILY   nitroGLYCERIN 0.4 MG SL tablet Commonly known as:  NITROSTAT Place 1 tablet (0.4 mg total) under the tongue every 5 (five) minutes as needed for chest pain.   pantoprazole 40 MG tablet Commonly known as:  PROTONIX Take 1 tablet (40 mg total) by mouth daily.       Allergies: No Known Allergies  Past Medical History:  Diagnosis Date  . Anemia   . CAD (coronary artery disease)    a. 04/2013 Inf STEMI/PCI: LM nl, LAD min irregs, D1/2/3 nl, LCX 38m (2.5x18 Xience DES), OM1/2/3 nl, RCA nl, PDA nl, RPAV nl, RPL nl, EF 55%;  b. 04/2013 Echo: EF 55-60%, no rwma, mild MR, mildly dil LA.  . Diabetes mellitus type 2, uncontrolled ([place])    a. 04/2013 HbA1c = 11.7.  . Hyperlipidemia   . Hypertension   . Hypothyroidism     Past Surgical History:  Procedure Laterality Date  . CESAREAN SECTION    . LEFT HEART CATH  05-15-13  . LEFT HEART CATHETERIZATION WITH CORONARY ANGIOGRAM N/A 05/15/2013   Procedure: LEFT HEART CATHETERIZATION WITH CORONARY ANGIOGRAM;  Surgeon: Wellington Hampshire, MD;  Location: [facility] CATH LAB;  Service: Cardiovascular;  Laterality: N/A;  . PERCUTANEOUS STENT INTERVENTION  05/15/2013   Procedure: PERCUTANEOUS STENT INTERVENTION;  Surgeon: Wellington Hampshire, MD;  Location: [facility] CATH LAB;  Service: Cardiovascular;;    Family History  Problem Relation Age of Onset  . Heart disease Mother   . Diabetes Mother   . Hypertension Mother   . Heart disease Father   . Diabetes Father   . Hypertension Father   . Diabetes Brother     Social History:  reports that she has never smoked. She has never used smokeless tobacco. She reports that she does not  drink alcohol or use drugs.    Review of Systems   HYPOTHYROIDISM: Screening TSH has been done because of her complaints of carpal tunnel syndrome  She does feel tired at times but is not complaining as much now Because of her high TSH she was told to start levothyroxine 25 mcg daily  She is taking this before breakfast daily as directed No history of goiter  Last TSH:  Lab Results  Component Value Date   TSH 2.18 08/10/2018   TSH 2.46 04/26/2018   TSH 4.26 12/28/2017   FREET4 0.68 09/03/2017     NEUROPATHY: She has mild intermittent symptoms of numbness in her hands and mostly when waking up  Her symptoms of burning in her lower arms especially in contact with clothes are better with gabapentin Also at night she has some pains in her feet and lower legs with some stiffness   Lipids: She Has been on Lipitor 80 mg for quite some time.  Lipids are well controlled, has history of CAD      Lab Results  Component Value Date   CHOL 103 05/25/2018   HDL 38 (L) 05/25/2018   LDLCALC 43 05/25/2018   TRIG 112 05/25/2018   CHOLHDL 2.7 05/25/2018                  The blood pressure has been treated with lisinopril 20 mg  Blood pressure appears to be not as well controlled, she may have missed only 1 or 2 days of lisinopril last week but was out of Invokana for about a week  Home BP also being checked but very rarely now   BP Readings from Last 3 Encounters:  08/16/18 (!) 144/80  06/17/18 126/68  05/31/18 140/77    LABS:  Lab on 08/10/2018  Component Date Value Ref Range Status  . Color, Urine 08/10/2018 YELLOW  Yellow;Lt. Yellow;Straw;Dark Yellow;Amber;Green;Red;Brown Final  . APPearance 08/10/2018 CLEAR  Clear;Turbid;Slightly Cloudy;Cloudy Final  . Specific Gravity, Urine 08/10/2018 1.020  1.000 - 1.030 Final  . pH 08/10/2018 6.0  5.0 - 8.0 Final  . Total Protein, Urine 08/10/2018 NEGATIVE  Negative Final  . Urine Glucose 08/10/2018 NEGATIVE  Negative Final  .  Ketones, ur 08/10/2018 NEGATIVE  Negative Final  . Bilirubin Urine 08/10/2018 NEGATIVE  Negative Final  . Hgb urine dipstick 08/10/2018 TRACE-INTACT* Negative Final  . Urobilinogen, UA 08/10/2018 0.2  0.0 - 1.0 Final  . Leukocytes,Ua 08/10/2018 NEGATIVE  Negative Final  . Nitrite 08/10/2018 NEGATIVE  Negative Final  . WBC, UA 08/10/2018 0-2/hpf  0-2/hpf Final  . RBC / HPF 08/10/2018 0-2/hpf  0-2/hpf Final  . Squamous Epithelial / LPF 08/10/2018 Rare(0-4/hpf)  Rare(0-4/hpf) Final  . Microalb, Ur 08/10/2018 2.3* 0.0 - 1.9 mg/dL Final  . Creatinine,U 08/10/2018 85.4  mg/dL Final  . Microalb Creat Ratio 08/10/2018 2.7  0.0 - 30.0 mg/g Final  . TSH 08/10/2018 2.18  0.35 - 4.50 uIU/mL Final  . Sodium 08/10/2018 138  135 - 145 mEq/L  Final  . Potassium 08/10/2018 4.5  3.5 - 5.1 mEq/L Final  . Chloride 08/10/2018 103  96 - 112 mEq/L Final  . CO2 08/10/2018 27  19 - 32 mEq/L Final  . Glucose, Bld 08/10/2018 120* 70 - 99 mg/dL Final  . BUN 08/10/2018 17  6 - 23 mg/dL Final  . Creatinine, Ser 08/10/2018 0.59  0.40 - 1.20 mg/dL Final  . Total Bilirubin 08/10/2018 0.5  0.2 - 1.2 mg/dL Final  . Alkaline Phosphatase 08/10/2018 43  39 - 117 U/L Final  . AST 08/10/2018 12  0 - 37 U/L Final  . ALT 08/10/2018 8  0 - 35 U/L Final  . Total Protein 08/10/2018 6.9  6.0 - 8.3 g/dL Final  . Albumin 08/10/2018 4.2  3.5 - 5.2 g/dL Final  . Calcium 08/10/2018 9.7  8.4 - 10.5 mg/dL Final  . GFR 08/10/2018 102.54  >60.00 mL/min Final  . Hgb A1c MFr Bld 08/10/2018 6.9* 4.6 - 6.5 % Final   Glycemic Control Guidelines for People with Diabetes:Non Diabetic:  <6%Goal of Therapy: <7%Additional Action Suggested:  >8%   Appointment on 08/10/2018  Component Date Value Ref Range Status  . Iron 08/10/2018 31* 42 - 145 ug/dL Final  . Transferrin 08/10/2018 388.0* 212.0 - 360.0 mg/dL Final  . Saturation Ratios 08/10/2018 5.7* 20.0 - 50.0 % Final  . Ferritin 08/10/2018 4.7* 10.0 - 291.0 ng/mL Final  . WBC 08/10/2018 6.8  4.0  - 10.5 K/uL Final  . RBC 08/10/2018 4.59  3.87 - 5.11 Mil/uL Final  . Hemoglobin 08/10/2018 10.9* 12.0 - 15.0 g/dL Final  . HCT 08/10/2018 34.3* 36.0 - 46.0 % Final  . MCV 08/10/2018 74.7* 78.0 - 100.0 fl Final  . MCHC 08/10/2018 31.8  30.0 - 36.0 g/dL Final  . RDW 08/10/2018 24.6* 11.5 - 15.5 % Final  . Platelets 08/10/2018 378.0  150.0 - 400.0 K/uL Final  . Neutrophils Relative % 08/10/2018 62.5  43.0 - 77.0 % Final  . Lymphocytes Relative 08/10/2018 25.9  12.0 - 46.0 % Final  . Monocytes Relative 08/10/2018 9.1  3.0 - 12.0 % Final  . Eosinophils Relative 08/10/2018 2.0  0.0 - 5.0 % Final  . Basophils Relative 08/10/2018 0.5  0.0 - 3.0 % Final  . Neutro Abs 08/10/2018 4.3  1.4 - 7.7 K/uL Final  . Lymphs Abs 08/10/2018 1.8  0.7 - 4.0 K/uL Final  . Monocytes Absolute 08/10/2018 0.6  0.1 - 1.0 K/uL Final  . Eosinophils Absolute 08/10/2018 0.1  0.0 - 0.7 K/uL Final  . Basophils Absolute 08/10/2018 0.0  0.0 - 0.1 K/uL Final    Physical Examination:  BP (!) 144/80   Pulse 72   Ht 5\' 2"  (1.575 m)   Wt 131 lb 9.6 oz (59.7 kg)   SpO2 94%   BMI 24.07 kg/m    No ankle edema  ASSESSMENT:  Diabetes type 2, non-insulin requiring See history of present illness for discussion of current diabetes management, blood sugar patterns and problems identified  A1c is improved at 6.9  Difficult to judge her blood sugars at home since she has been out of her medication for a week and also with traveling she has had inconsistent diet However has not gained any weight Blood sugars are fairly good except periodically going up between 140-170 in the last week mostly  She is back on her medications regularly Reminded her to start regular walking for exercise and check readings at different times by rotation  Hypothyroidism:  Mild and well controlled on 25 mcg levothyroxine and she will continue  NEUROPATHY: Controlled with gabapentin currently   HYPERTENSION: Blood pressure may be higher today  from recently being out of her Invokana She does take her lisinopril regularly No microalbuminuria  PLAN:  Follow-up in 3 months  We will check blood pressure regularly Also make sure she checks readings after meals She will take acarbose right before starting to eat    Patient Instructions  Check BP  Check blood sugars on waking up 2-3 days a week  Also check blood sugars about 2 hours after meals and do this after different meals by rotation  Recommended blood sugar levels on waking up are 90-130 and about 2 hours after meal is 130-160  Please bring your blood sugar monitor to each visit, thank you     Elayne Snare 08/16/2018, 11:48 AM    Note: This office note was prepared with Dragon voice recognition system technology. Any transcriptional errors that result from this process are unintentional.

## 2018-08-17 ENCOUNTER — Other Ambulatory Visit: Payer: Self-pay

## 2018-08-17 DIAGNOSIS — D509 Iron deficiency anemia, unspecified: Secondary | ICD-10-CM

## 2018-08-20 ENCOUNTER — Ambulatory Visit (HOSPITAL_COMMUNITY)
Admission: RE | Admit: 2018-08-20 | Discharge: 2018-08-20 | Disposition: A | Discharge status: Home / Self Care | Payer: Medicaid Other | Source: Ambulatory Visit | Attending: Internal Medicine | Admitting: Internal Medicine

## 2018-08-20 DIAGNOSIS — D509 Iron deficiency anemia, unspecified: Secondary | ICD-10-CM | POA: Insufficient documentation

## 2018-08-20 MED ORDER — SODIUM CHLORIDE 0.9 % IV SOLN
INTRAVENOUS | Status: DC | PRN
  Administered 2018-08-20: 250 mL via INTRAVENOUS

## 2018-08-20 MED ORDER — SODIUM CHLORIDE 0.9 % IV SOLN
510.0000 mg | INTRAVENOUS | Status: DC
  Administered 2018-08-20: 510 mg via INTRAVENOUS
  Filled 2018-08-20: qty 17

## 2018-08-20 NOTE — Progress Notes (Signed)
Patient received Feraheme via a PIV. Observed for at least 30 minutes post infusion.Tolerated well, vitals stable, discharge instructions given to the patient and her son, verbalized understanding. Patient alert, oriented and ambulatory at the time of discharge.

## 2018-08-20 NOTE — Discharge Instructions (Signed)

## 2018-08-27 ENCOUNTER — Encounter (HOSPITAL_COMMUNITY): Payer: Medicaid Other

## 2018-09-03 ENCOUNTER — Other Ambulatory Visit: Payer: Medicaid Other

## 2018-09-03 DIAGNOSIS — A048 Other specified bacterial intestinal infections: Secondary | ICD-10-CM

## 2018-09-06 LAB — HELICOBACTER PYLORI  SPECIAL ANTIGEN
MICRO NUMBER:: 341077
SPECIMEN QUALITY: ADEQUATE

## 2018-09-09 ENCOUNTER — Other Ambulatory Visit: Payer: Self-pay | Admitting: Endocrinology

## 2018-09-14 ENCOUNTER — Other Ambulatory Visit: Payer: Self-pay

## 2018-09-14 DIAGNOSIS — D509 Iron deficiency anemia, unspecified: Secondary | ICD-10-CM

## 2018-10-11 ENCOUNTER — Other Ambulatory Visit: Payer: Self-pay | Admitting: Endocrinology

## 2018-11-03 ENCOUNTER — Other Ambulatory Visit (INDEPENDENT_AMBULATORY_CARE_PROVIDER_SITE_OTHER): Payer: Medicaid Other

## 2018-11-03 DIAGNOSIS — D509 Iron deficiency anemia, unspecified: Secondary | ICD-10-CM | POA: Diagnosis not present

## 2018-11-03 LAB — IBC + FERRITIN
Ferritin: 22.5 ng/mL (ref 10.0–291.0)
Iron: 70 ug/dL (ref 42–145)
Saturation Ratios: 17.1 % — ABNORMAL LOW (ref 20.0–50.0)
Transferrin: 293 mg/dL (ref 212.0–360.0)

## 2018-11-03 LAB — CBC WITH DIFFERENTIAL/PLATELET
Basophils Absolute: 0 10*3/uL (ref 0.0–0.1)
Basophils Relative: 0.6 % (ref 0.0–3.0)
Eosinophils Absolute: 0.1 10*3/uL (ref 0.0–0.7)
Eosinophils Relative: 1.9 % (ref 0.0–5.0)
HCT: 39.1 % (ref 36.0–46.0)
Hemoglobin: 13 g/dL (ref 12.0–15.0)
Lymphocytes Relative: 28.6 % (ref 12.0–46.0)
Lymphs Abs: 1.5 10*3/uL (ref 0.7–4.0)
MCHC: 33.4 g/dL (ref 30.0–36.0)
MCV: 84.9 fl (ref 78.0–100.0)
Monocytes Absolute: 0.5 10*3/uL (ref 0.1–1.0)
Monocytes Relative: 9.3 % (ref 3.0–12.0)
Neutro Abs: 3.1 10*3/uL (ref 1.4–7.7)
Neutrophils Relative %: 59.6 % (ref 43.0–77.0)
Platelets: 269 10*3/uL (ref 150.0–400.0)
RBC: 4.6 Mil/uL (ref 3.87–5.11)
RDW: 18.3 % — ABNORMAL HIGH (ref 11.5–15.5)
WBC: 5.3 10*3/uL (ref 4.0–10.5)

## 2018-11-09 ENCOUNTER — Other Ambulatory Visit: Payer: Self-pay

## 2018-11-09 ENCOUNTER — Other Ambulatory Visit: Payer: Self-pay | Admitting: Internal Medicine

## 2018-11-09 DIAGNOSIS — D509 Iron deficiency anemia, unspecified: Secondary | ICD-10-CM

## 2018-11-15 ENCOUNTER — Other Ambulatory Visit (INDEPENDENT_AMBULATORY_CARE_PROVIDER_SITE_OTHER): Payer: Medicaid Other

## 2018-11-15 ENCOUNTER — Other Ambulatory Visit: Payer: Self-pay

## 2018-11-15 DIAGNOSIS — E1142 Type 2 diabetes mellitus with diabetic polyneuropathy: Secondary | ICD-10-CM | POA: Diagnosis not present

## 2018-11-15 LAB — COMPREHENSIVE METABOLIC PANEL
ALT: 12 U/L (ref 0–35)
AST: 14 U/L (ref 0–37)
Albumin: 4.1 g/dL (ref 3.5–5.2)
Alkaline Phosphatase: 38 U/L — ABNORMAL LOW (ref 39–117)
BUN: 12 mg/dL (ref 6–23)
CO2: 27 mEq/L (ref 19–32)
Calcium: 10 mg/dL (ref 8.4–10.5)
Chloride: 102 mEq/L (ref 96–112)
Creatinine, Ser: 0.7 mg/dL (ref 0.40–1.20)
GFR: 84.11 mL/min (ref >60.00)
Glucose, Bld: 111 mg/dL — ABNORMAL HIGH (ref 70–99)
Potassium: 4.7 mEq/L (ref 3.5–5.1)
Sodium: 139 mEq/L (ref 135–145)
Total Bilirubin: 0.5 mg/dL (ref 0.2–1.2)
Total Protein: 6.9 g/dL (ref 6.0–8.3)

## 2018-11-15 LAB — HEMOGLOBIN A1C: Hgb A1c MFr Bld: 6.9 % — ABNORMAL HIGH (ref 4.6–6.5)

## 2018-11-16 NOTE — Progress Notes (Signed)
Patient ID: [name] [name], female   DOB: 11/10/1955, 63 y.o.   MRN: [id number]           Reason for Appointment: Follow-up for Type 2 Diabetes   History of Present Illness:          Diagnosis: Type 2 diabetes mellitus, date of diagnosis: ?  2014   Past history:   Her son thinks that she may have had diabetes diagnosed when she was in [place] When she moved to [facility] in 2014 she was not on any medications She was however found to have significant hyperglycemia with glucose 364 and A1c of 11.7 when she was admitted for her coronary artery disease in 04/2013. At that time she was started on premixed insulin twice a day Her level of control had not been adequate with persistently high A1c readings She did not tolerate Victoza well and was having decreased appetite, malaise and nausea and this was stopped subsequently  Recent history:        Oral hypoglycemic drugs:     Metformin ER 500 mg 4 tablets daily, Invokana 100 mg daily, Precose 25 mg before meals   Her A1c has been as low as 6.9 and is unchanged    Current blood sugar patterns and problems:  Her blood glucose meter download showed that sugars are excellent and compliant to her last visit and mostly near normal  She is compliant with checking blood sugars at various times of the day including after meals  She is trying to walk up to 3 times indoors at home and stay active  Her main complaint is since about February or so she has not had much taste or appetite for food and is mostly trying to force herself to eat  She has lost 2 pounds  Does not have any nausea and has not had previous side effects with any of her diabetes medications  Hypoglycemia: None    Side effects from medications have been: None  Self-care: The diet that the patient has been following is: None, has vegetarian diet Usually having vegetables and roti with lunch and dinner and sometimes having lentils.   She does not like yogurt as it  apparently causes heartburn  Usually not eating fried food except for some snacks            Exercise:  Trying to walk indoors regularly      Dietician visit, none   Compliance with the medical regimen: Fair  Glucose monitoring:  done about 1 times a day, less recently       Glucometer: Accu-Chek  Blood Glucose readings by meter download and average readings as follows:   PRE-MEAL Fasting Lunch Dinner Bedtime Overall  Glucose range:  98-143     80-157  Mean/median:      114   POST-MEAL PC Breakfast PC Lunch PC Dinner  Glucose range:  108-157  83-130  87-140  Mean/median:      Previous average 135  Weight history:   Wt Readings from Last 3 Encounters:  11/17/18 129 lb 6.4 oz (58.7 kg)  08/16/18 131 lb 9.6 oz (59.7 kg)  05/31/18 132 lb 9.6 oz (60.1 kg)    Glycemic control:   Lab Results  Component Value Date   HGBA1C 6.9 (H) 11/15/2018   HGBA1C 6.9 (H) 08/10/2018   HGBA1C 7.2 (H) 04/26/2018   Lab Results  Component Value Date   MICROALBUR 2.3 (H) 08/10/2018   LDLCALC 43 05/25/2018   CREATININE 0.70  11/15/2018    Lab on 11/15/2018  Component Date Value Ref Range Status  . Sodium 11/15/2018 139  135 - 145 mEq/L Final  . Potassium 11/15/2018 4.7  3.5 - 5.1 mEq/L Final  . Chloride 11/15/2018 102  96 - 112 mEq/L Final  . CO2 11/15/2018 27  19 - 32 mEq/L Final  . Glucose, Bld 11/15/2018 111* 70 - 99 mg/dL Final  . BUN 11/15/2018 12  6 - 23 mg/dL Final  . Creatinine, Ser 11/15/2018 0.70  0.40 - 1.20 mg/dL Final  . Total Bilirubin 11/15/2018 0.5  0.2 - 1.2 mg/dL Final  . Alkaline Phosphatase 11/15/2018 38* 39 - 117 U/L Final  . AST 11/15/2018 14  0 - 37 U/L Final  . ALT 11/15/2018 12  0 - 35 U/L Final  . Total Protein 11/15/2018 6.9  6.0 - 8.3 g/dL Final  . Albumin 11/15/2018 4.1  3.5 - 5.2 g/dL Final  . Calcium 11/15/2018 10.0  8.4 - 10.5 mg/dL Final  . GFR 11/15/2018 84.11  >60.00 mL/min Final  . Hgb A1c MFr Bld 11/15/2018 6.9* 4.6 - 6.5 % Final   Glycemic  Control Guidelines for People with Diabetes:Non Diabetic:  <6%Goal of Therapy: <7%Additional Action Suggested:  >8%       Allergies as of 11/17/2018   No Known Allergies     Medication List       Accurate as of November 17, 2018 10:09 AM. If you have any questions, ask your nurse or doctor.        STOP taking these medications   bismuth-metronidazole-tetracycline 140-125-125 MG capsule Commonly known as:  Pylera Stopped by:  Elayne Snare, MD   docusate sodium 100 MG capsule Commonly known as:  COLACE Stopped by:  Elayne Snare, MD   FeroSul 325 (65 FE) MG tablet Generic drug:  ferrous sulfate Stopped by:  Elayne Snare, MD     TAKE these medications   acarbose 25 MG tablet Commonly known as:  PRECOSE TAKE 1 TABLET BY MOUTH AT BREAKFAST THEN TAKE 2 TABLETS BY MOUTH AT LUNCH AND 1 TABLET BY MOUTH AT DINNER   Accu-Chek FastClix Lancets Misc USE TO CHECK BLOOD SUGAR 3 TIMES PER DAY   aspirin EC 81 MG tablet Take 1 tablet (81 mg total) by mouth daily.   atorvastatin 80 MG tablet Commonly known as:  LIPITOR Take 1 tablet (80 mg total) by mouth daily at 6 PM.   canagliflozin 100 MG Tabs tablet Commonly known as:  Invokana Take 1 tablet (100 mg total) by mouth daily.   gabapentin 100 MG capsule Commonly known as:  NEURONTIN TAKE 2 CAPSULES (200 MG TOTAL) BY MOUTH AT BEDTIME.   glucose blood test strip Commonly known as:  Accu-Chek SmartView UAD tid to monitor glucose; Dx: E11.65   levothyroxine 25 MCG tablet Commonly known as:  SYNTHROID Take 1 tablet (25 mcg total) by mouth daily before breakfast.   lisinopril 20 MG tablet Commonly known as:  ZESTRIL Take 1 tablet (20 mg total) by mouth daily.   metFORMIN 500 MG 24 hr tablet Commonly known as:  GLUCOPHAGE-XR TAKE 4 TABLETS BY MOUTH DAILY WITH SUPPER   metoprolol tartrate 25 MG tablet Commonly known as:  LOPRESSOR TAKE ONE-HALF TABLET (12.5 MG TOTAL) BY MOUTH 2 (TWO) TIMES DAILY   nitroGLYCERIN 0.4 MG SL tablet  Commonly known as:  NITROSTAT Place 1 tablet (0.4 mg total) under the tongue every 5 (five) minutes as needed for chest pain.   pantoprazole 40  MG tablet Commonly known as:  PROTONIX Take 1 tablet (40 mg total) by mouth daily.       Allergies: No Known Allergies  Past Medical History:  Diagnosis Date  . Anemia   . CAD (coronary artery disease)    a. 04/2013 Inf STEMI/PCI: LM nl, LAD min irregs, D1/2/3 nl, LCX 26m (2.5x18 Xience DES), OM1/2/3 nl, RCA nl, PDA nl, RPAV nl, RPL nl, EF 55%;  b. 04/2013 Echo: EF 55-60%, no rwma, mild MR, mildly dil LA.  . Diabetes mellitus type 2, uncontrolled ([place])    a. 04/2013 HbA1c = 11.7.  . Hyperlipidemia   . Hypertension   . Hypothyroidism     Past Surgical History:  Procedure Laterality Date  . CESAREAN SECTION    . LEFT HEART CATH  05-15-13  . LEFT HEART CATHETERIZATION WITH CORONARY ANGIOGRAM N/A 05/15/2013   Procedure: LEFT HEART CATHETERIZATION WITH CORONARY ANGIOGRAM;  Surgeon: Wellington Hampshire, MD;  Location: [facility];  Service: Cardiovascular;  Laterality: N/A;  . PERCUTANEOUS STENT INTERVENTION  05/15/2013   Procedure: PERCUTANEOUS STENT INTERVENTION;  Surgeon: Wellington Hampshire, MD;  Location: [facility];  Service: Cardiovascular;;    Family History  Problem Relation Age of Onset  . Heart disease Mother   . Diabetes Mother   . Hypertension Mother   . Heart disease Father   . Diabetes Father   . Hypertension Father   . Diabetes Brother     Social History:  reports that she has never smoked. She has never used smokeless tobacco. She reports that she does not drink alcohol or use drugs.    Review of Systems   HYPOTHYROIDISM: Screening TSH has been done because of her complaints of carpal tunnel syndrome  She does feel tired at times but is not complaining as much now Because of her high TSH she was told to start levothyroxine 25 mcg daily  She is taking this before breakfast daily as directed No history of goiter   Last TSH:  Lab Results  Component Value Date   TSH 2.18 08/10/2018   TSH 2.46 04/26/2018   TSH 4.26 12/28/2017   FREET4 0.68 09/03/2017     NEUROPATHY: She has mild intermittent symptoms of numbness in her hands and mostly when waking up  Her symptoms of burning in her lower arms especially in contact with clothes are controlled with gabapentin Also at night she has some pains in her feet and lower legs with some stiffness   Lipids: She is treated with Lipitor 80 mg for quite some time.  Lipids are well controlled, has history of CAD      Lab Results  Component Value Date   CHOL 103 05/25/2018   HDL 38 (L) 05/25/2018   LDLCALC 43 05/25/2018   TRIG 112 05/25/2018   CHOLHDL 2.7 05/25/2018                  The blood pressure has been treated with lisinopril 20 mg  Blood pressure appears excellent today However her son feels that sometimes she has headaches and occasionally blood pressure may be up to 160 but this is inconsistent and may fluctuate within the day Is using a blood pressure meter from [employer]  No lightheadedness   BP Readings from Last 3 Encounters:  11/17/18 120/70  08/20/18 134/73  08/16/18 (!) 144/80    LABS:  Lab on 11/15/2018  Component Date Value Ref Range Status  . Sodium 11/15/2018 139  135 -  145 mEq/L Final  . Potassium 11/15/2018 4.7  3.5 - 5.1 mEq/L Final  . Chloride 11/15/2018 102  96 - 112 mEq/L Final  . CO2 11/15/2018 27  19 - 32 mEq/L Final  . Glucose, Bld 11/15/2018 111* 70 - 99 mg/dL Final  . BUN 11/15/2018 12  6 - 23 mg/dL Final  . Creatinine, Ser 11/15/2018 0.70  0.40 - 1.20 mg/dL Final  . Total Bilirubin 11/15/2018 0.5  0.2 - 1.2 mg/dL Final  . Alkaline Phosphatase 11/15/2018 38* 39 - 117 U/L Final  . AST 11/15/2018 14  0 - 37 U/L Final  . ALT 11/15/2018 12  0 - 35 U/L Final  . Total Protein 11/15/2018 6.9  6.0 - 8.3 g/dL Final  . Albumin 11/15/2018 4.1  3.5 - 5.2 g/dL Final  . Calcium 11/15/2018 10.0  8.4 - 10.5 mg/dL Final  .  GFR 11/15/2018 84.11  >60.00 mL/min Final  . Hgb A1c MFr Bld 11/15/2018 6.9* 4.6 - 6.5 % Final   Glycemic Control Guidelines for People with Diabetes:Non Diabetic:  <6%Goal of Therapy: <7%Additional Action Suggested:  >8%     Physical Examination:  BP 120/70 (BP Location: Left Arm, Patient Position: Sitting, Cuff Size: Normal)   Pulse 68   Ht 5\' 2"  (1.575 m)   Wt 129 lb 6.4 oz (58.7 kg)   SpO2 98%   BMI 23.67 kg/m    No ankle edema  ASSESSMENT:  Diabetes type 2, non-insulin requiring See history of present illness for discussion of current diabetes management, blood sugar patterns and problems identified  A1c is unchanged at 6.9  Blood sugars are excellent at home and highest reading only in the 150s She checks readings after meals With her decreased appetite she appears to be losing weight a little bit She is trying to be active  Her metformin and acarbose have been continued for more than a couple of years and discussed that it is unlikely that these are causing anorexia Continue Invokana, can have Diflucan prescription for as needed use  Hypothyroidism: Mild and well controlled on 25 mcg levothyroxine and she needs follow-up labs  Decreased appetite and loss of taste: Etiology unclear   HYPERTENSION: Blood pressure appears excellent She thinks her blood pressure may be occasionally high and she has some headaches but not clear if her home monitor is accurate Renal function quite normal   PLAN:    She will try to leave off her acarbose for a week and see if it makes a difference in her appetite Otherwise she needs to discuss her anorexia with PCP Also unlikely that her headaches are related to high blood pressure she will need to discuss with PCP She needs to bring her monitor to the office for comparison for her blood pressure No change in other medications  Follow-up in 4 months   There are no Patient Instructions on file for this visit.  Elayne Snare  11/17/2018, 10:09 AM    Note: This office note was prepared with Dragon voice recognition system technology. Any transcriptional errors that result from this process are unintentional.

## 2018-11-17 ENCOUNTER — Other Ambulatory Visit: Payer: Self-pay

## 2018-11-17 ENCOUNTER — Encounter: Payer: Self-pay | Admitting: Endocrinology

## 2018-11-17 ENCOUNTER — Ambulatory Visit (INDEPENDENT_AMBULATORY_CARE_PROVIDER_SITE_OTHER): Payer: Medicaid Other | Admitting: Endocrinology

## 2018-11-17 VITALS — BP 120/70 | HR 68 | Ht 62.0 in | Wt 129.4 lb

## 2018-11-17 DIAGNOSIS — E1140 Type 2 diabetes mellitus with diabetic neuropathy, unspecified: Secondary | ICD-10-CM

## 2018-11-17 DIAGNOSIS — E1165 Type 2 diabetes mellitus with hyperglycemia: Secondary | ICD-10-CM

## 2018-11-17 DIAGNOSIS — E063 Autoimmune thyroiditis: Secondary | ICD-10-CM | POA: Diagnosis not present

## 2018-11-17 MED ORDER — FLUCONAZOLE 150 MG PO TABS: 150.0000 mg | ORAL_TABLET | Freq: Once | ORAL | 0 refills | Status: AC

## 2018-11-17 NOTE — Patient Instructions (Signed)
Try stopping Acarbose 1 week

## 2018-11-18 ENCOUNTER — Telehealth: Payer: Self-pay | Admitting: *Deleted

## 2018-11-18 NOTE — Telephone Encounter (Signed)
Virtual Visit Pre-Appointment Phone Call  "(Name), I am calling you today to discuss your upcoming appointment. We are currently trying to limit exposure to the virus that causes COVID-19 by seeing patients at home rather than in the office."  1. "What is the BEST phone number to call the day of the visit?" - include this in appointment notes  2. "Do you have or have access to (through a family member/friend) a smartphone with video capability that we can use for your visit?" a. If yes - list this number in appt notes as "cell" (if different from BEST phone #) and list the appointment type as a VIDEO visit in appointment notes b. If no - list the appointment type as a PHONE visit in appointment notes  3. Confirm consent - "In the setting of the current Covid19 crisis, you are scheduled for a (phone or video) visit with your provider on (/Wednesday, June 10) at (9:30 am ).  Just as we do with many in-office visits, in order for you to participate in this visit, we must obtain consent.  If you'd like, I can send this to your mychart (if signed up) or email for you to review.  Otherwise, I can obtain your verbal consent now.  All virtual visits are billed to your insurance company just like a normal visit would be.  By agreeing to a virtual visit, we'd like you to understand that the technology does not allow for your provider to perform an examination, and thus may limit your provider's ability to fully assess your condition. If your provider identifies any concerns that need to be evaluated in person, we will make arrangements to do so.  Finally, though the technology is pretty good, we cannot assure that it will always work on either your or our end, and in the setting of a video visit, we may have to convert it to a phone-only visit.  In either situation, we cannot ensure that we have a secure connection.  Are you willing to proceed?" STAFF: Did the patient verbally acknowledge consent to telehealth  visit? Document YES/NO here: YES.  4. Advise patient to be prepared - "Two hours prior to your appointment, go ahead and check your blood pressure, pulse, oxygen saturation, and your weight (if you have the equipment to check those) and write them all down. When your visit starts, your provider will ask you for this information. If you have an Apple Watch or Kardia device, please plan to have heart rate information ready on the day of your appointment. Please have a pen and paper handy nearby the day of the visit as well."  5. Give patient instructions for MyChart download to smartphone OR [email or web address] as below if video visit (depending on what platform provider is using)  6. Inform patient they will receive a phone call 15 minutes prior to their appointment time (may be from unknown caller ID) so they should be prepared to answer    TELEPHONE CALL NOTE  [name] [name] has been deemed a candidate for a follow-up tele-health visit to limit community exposure during the Covid-19 pandemic. I spoke with the patient via phone to ensure availability of phone/video source, confirm preferred email & phone number, and discuss instructions and expectations.  I reminded [name] [name] to be prepared with any vital sign and/or heart rhythm information that could potentially be obtained via home monitoring, at the time of her visit. I reminded [name] [name] to expect a phone call prior  to her visit.  [name] [name] 11/18/2018 9:59 AM   INSTRUCTIONS FOR DOWNLOADING THE MYCHART APP TO SMARTPHONE  - The patient must first make sure to have activated MyChart and know their login information - If Apple, go to [employer] and type in MyChart in the search bar and download the app. If Android, ask patient to go to [employer] and type in [facility] in the search bar and download the app. The app is free but as with any other app downloads, their phone may require them to verify saved payment  information or Apple/Android password.  - The patient will need to then log into the app with their MyChart username and password, and select [facility] as their healthcare provider to link the account. When it is time for your visit, go to the MyChart app, find appointments, and click Begin Video Visit. Be sure to Select Allow for your device to access the Microphone and Camera for your visit. You will then be connected, and your provider will be with you shortly.  **If they have any issues connecting, or need assistance please contact MyChart service desk [phone])**  **If using a computer, in order to ensure the best quality for their visit they will need to use either of the following Internet Browsers: [employer], or Google Chrome**  IF USING DOXIMITY or DOXY.ME - The patient will receive a link just prior to their visit by text.     FULL LENGTH CONSENT FOR TELE-HEALTH VISIT   I hereby voluntarily request, consent and authorize [facility] and its employed or contracted physicians, physician assistants, nurse practitioners or other licensed health care professionals (the Practitioner), to provide me with telemedicine health care services (the "Services") as deemed necessary by the treating Practitioner. I acknowledge and consent to receive the Services by the Practitioner via telemedicine. I understand that the telemedicine visit will involve communicating with the Practitioner through live audiovisual communication technology and the disclosure of certain medical information by electronic transmission. I acknowledge that I have been given the opportunity to request an in-person assessment or other available alternative prior to the telemedicine visit and am voluntarily participating in the telemedicine visit.  I understand that I have the right to withhold or withdraw my consent to the use of telemedicine in the course of my care at any time, without affecting my right  to future care or treatment, and that the Practitioner or I may terminate the telemedicine visit at any time. I understand that I have the right to inspect all information obtained and/or recorded in the course of the telemedicine visit and may receive copies of available information for a reasonable fee.  I understand that some of the potential risks of receiving the Services via telemedicine include:  Marland Kitchen Delay or interruption in medical evaluation due to technological equipment failure or disruption; . Information transmitted may not be sufficient (e.g. poor resolution of images) to allow for appropriate medical decision making by the Practitioner; and/or  . In rare instances, security protocols could fail, causing a breach of personal health information.  Furthermore, I acknowledge that it is my responsibility to provide information about my medical history, conditions and care that is complete and accurate to the best of my ability. I acknowledge that Practitioner's advice, recommendations, and/or decision may be based on factors not within their control, such as incomplete or inaccurate data provided by me or distortions of diagnostic images or specimens that may result from electronic transmissions.  I understand that the practice of medicine is not an exact science and that Practitioner makes no warranties or guarantees regarding treatment outcomes. I acknowledge that I will receive a copy of this consent concurrently upon execution via email to the email address I last provided but may also request a printed copy by calling the office of [facility].    I understand that my insurance will be billed for this visit.   I have read or had this consent read to me. . I understand the contents of this consent, which adequately explains the benefits and risks of the Services being provided via telemedicine.  . I have been provided ample opportunity to ask questions regarding this consent and the Services  and have had my questions answered to my satisfaction. . I give my informed consent for the services to be provided through the use of telemedicine in my medical care  By participating in this telemedicine visit I agree to the above.

## 2018-11-23 NOTE — Progress Notes (Signed)
Telehealth Visit     Virtual Visit via Video Note   This visit type was conducted due to national recommendations for restrictions regarding the COVID-19 Pandemic (e.g. social distancing) in an effort to limit this patient's exposure and mitigate transmission in our community.  Due to her co-morbid illnesses, this patient is at least at moderate risk for complications without adequate follow up.  This format is felt to be most appropriate for this patient at this time.  All issues noted in this document were discussed and addressed.  A limited physical exam was performed with this format.  Please refer to the patient's chart for her consent to telehealth for [facility].   Evaluation Performed:  Follow-up visit  This visit type was conducted due to national recommendations for restrictions regarding the COVID-19 Pandemic (e.g. social distancing).  This format is felt to be most appropriate for this patient at this time.  All issues noted in this document were discussed and addressed.  No physical exam was performed (except for noted visual exam findings with Video Visits).  Please refer to the patient's chart (MyChart message for video visits and phone note for telephone visits) for the patient's consent to telehealth for [facility].  Date:  11/24/2018   ID:  [name] [name], DOB April 30, 1956, MRN [id number]  Patient Location:    Provider location:     PCP:  Shawna Clamp, MD  Cardiologist:  Marisa Cyphers Electrophysiologist:  None   Chief Complaint:    History of Present Illness:    [name] [name] is a 63 y.o. female who presents via audio/video conferencing for a telehealth visit today.  Former patient of Dr. Claris Gladden - to transition to Dr. Marlou Porch however has not been seen yet. Has primarily seen me.   She has ahistory of diabetes, HTN, and CAD s/p DES tomLCxin the setting of inferior STEMI in 04/2013.EF was preserved.Other PMH includes HTN, T2 DM, hypothyroidism,  anemiaand HLD.  Seen by me back in November of 2018 - she was leaving to return to [place] for several months. She was doing well. No PCP. Some mild anemia noted.I saw her back in May - here with her son who translated. She was back from [place] but had been out of her medicines for about a month due to some paperwork not completed - had been back on her medicines but BP was elevated. Not able to obtain PCP due to having Medicaid.  Last seen by me in December- getting ready to leave for [place] - no cardiac issues - did have a PCP. She was more anemic and we referred her to GI and ended up having to stop her Plavix. She has been placed on iron and treated for Hpylori.   Unknown if the patient has symptoms concerning for COVID-19 infection (fever, chills, cough, or new shortness of breath).   Attempted multiple times for video call - no answer. Voice mail box is full.   Past Medical History:  Diagnosis Date  . Anemia   . CAD (coronary artery disease)    a. 04/2013 Inf STEMI/PCI: LM nl, LAD min irregs, D1/2/3 nl, LCX 67m (2.5x18 Xience DES), OM1/2/3 nl, RCA nl, PDA nl, RPAV nl, RPL nl, EF 55%;  b. 04/2013 Echo: EF 55-60%, no rwma, mild MR, mildly dil LA.  . Diabetes mellitus type 2, uncontrolled ([place])    a. 04/2013 HbA1c = 11.7.  . Hyperlipidemia   . Hypertension   . Hypothyroidism    Past Surgical History:  Procedure Laterality Date  . CESAREAN SECTION    . LEFT HEART CATH  05-15-13  . LEFT HEART CATHETERIZATION WITH CORONARY ANGIOGRAM N/A 05/15/2013   Procedure: LEFT HEART CATHETERIZATION WITH CORONARY ANGIOGRAM;  Surgeon: Wellington Hampshire, MD;  Location: [facility] CATH LAB;  Service: Cardiovascular;  Laterality: N/A;  . PERCUTANEOUS STENT INTERVENTION  05/15/2013   Procedure: PERCUTANEOUS STENT INTERVENTION;  Surgeon: Wellington Hampshire, MD;  Location: [facility] CATH LAB;  Service: Cardiovascular;;     No outpatient medications have been marked as taking for the 11/24/18 encounter (Telemedicine) with  Burtis Junes, NP.     Allergies:   Patient has no known allergies.   Social History   Tobacco Use  . Smoking status: Never Smoker  . Smokeless tobacco: Never Used  Substance Use Topics  . Alcohol use: No  . Drug use: No     Family Hx: The patient's family history includes Diabetes in her brother, father, and mother; Heart disease in her father and mother; Hypertension in her father and mother.  ROS:   Please see the history of present illness.   All other systems reviewed are negative except for .    Objective:    Vital Signs:  There were no vitals taken for this visit.   Wt Readings from Last 3 Encounters:  11/17/18 129 lb 6.4 oz (58.7 kg)  08/16/18 131 lb 9.6 oz (59.7 kg)  05/31/18 132 lb 9.6 oz (60.1 kg)    Alert female in no acute distress.   Labs/Other Tests and Data Reviewed:    Lab Results  Component Value Date   WBC 5.3 11/03/2018   HGB 13.0 11/03/2018   HCT 39.1 11/03/2018   PLT 269.0 11/03/2018   GLUCOSE 111 (H) 11/15/2018   CHOL 103 05/25/2018   TRIG 112 05/25/2018   HDL 38 (L) 05/25/2018   LDLCALC 43 05/25/2018   ALT 12 11/15/2018   AST 14 11/15/2018   NA 139 11/15/2018   K 4.7 11/15/2018   CL 102 11/15/2018   CREATININE 0.70 11/15/2018   BUN 12 11/15/2018   CO2 27 11/15/2018   TSH 2.18 08/10/2018   HGBA1C 6.9 (H) 11/15/2018   MICROALBUR 2.3 (H) 08/10/2018     BNP (last 3 results) No results for input(s): BNP in the last 8760 hours.  ProBNP (last 3 results) No results for input(s): PROBNP in the last 8760 hours.    Prior CV studies:    The following studies were reviewed today:  EchoStudy Conclusionsfrom 2014  - Left ventricle: The cavity size was normal. Wall thickness was increased in a pattern of mild LVH. Systolic function was normal. The estimated ejection fraction was in the range of 55% to 60%. Wall motion was normal; there were no regional wall motion abnormalities. - Mitral valve: Mild regurgitation. -  Left atrium: The atrium was mildly dilated. - Atrial septum: Redundant but no obvious PFO/ASD No defect or patent foramen ovale was identified.   Coronary angiography2014: Coronary dominance:Right   Left Main: Normal   Left Anterior Descending (LAD): Normal in size with minor irregularities.  1st diagonal (D1): Normal in size with no significant disease.  2nd diagonal (D2): Medium in size with no significant disease.  3rd diagonal (D3): Small in size with no significant disease.  Circumflex (LCx): Normal in size with minor irregularities proximally. There is a 99% hazy stenosis in the midsegment which is likely the culprit for inferior ST elevation MI. The stenosis is at the origin of  OM 2.  1st obtuse marginal: Small in size with minor irregularities.  2nd obtuse marginal: Normal in size with minor irregularities.  3rd obtuse marginal: Normal in size with no significant disease.  Right Coronary Artery: normal in size with no significant disease.  Posterior descending artery: Normal  Posterior AV segment: Small in size with no significant disease.  Posterolateral branchs: Small and normal posterolateral branches.  Left ventriculography: Left ventricular systolic function is normal , LVEF is estimated at 55 %, there is no significant mitral regurgitation   PCI Note:Following the diagnostic procedure, the decision was made to proceed with PCI. Weight-based bivalirudin was given for anticoagulation. Once a therapeutic ACT was achieved, a 6 [place] XB 3.0 guide catheter was inserted. A intuition coronary guidewire was used to cross the lesion. The lesion was predilated with a 2.5 x 12 balloon. The lesion was then stented with a 2.5 x 18 mm Xience expedition drug-eluting stent. There was a significant vessel mismatch between the proximal and the distal segment of the stent. The stent was postdilated with a 3.0 x 12 noncompliant balloon. Following PCI,  there was 0% residual stenosis and TIMI-3 flow. Final angiography confirmed an excellent result. The patient tolerated the procedure well. There were no immediate procedural complications. A TR band was used for radial hemostasis. The patient was transferred to the post catheterization recovery area for further monitoring.  PCI Data: Vessel - left circumflex artery/Segment - mid Percent Stenosis (pre) 99% TIMI-flow 3 Stent : 2.5 x 18 mm Xience expedition drug-eluting stent postdilated with a 3.0 noncompliant balloon in the mid and proximal segment Percent Stenosis (post) 0% TIMI-flow (post) 3   Final Conclusions:  1.Severe one-vessel coronary artery disease with 99% hazy mid left circumflex stenosis which is the culprit for inferior ST elevation MI. 2. Normal LV systolic function and mildly elevated left ventricular end-diastolic pressure. 3. Successful angioplasty and drug-eluting stent placement to the left circumflex artery.  Recommendations:  Continue dual antiplatelet therapy for at least one year. Aggressive treatment of risk factors is recommended.  Muhammad AridaMD, [facility] 05/15/2013, 2:51 AM    ASSESSMENT & PLAN:    1.HTN -   2.CAD:has had priorinferior STEMIin 2014 with PCI + DES to Lcx. Single vessel CAD. EF was normal.She continues to do well. No symptoms. Continue with CV risk factor modification. Remains on chronic DAPT therapy which Dr. Aundra Dubin had wanted to continue as long as no bleeding issues - she has had more issues with her anemia - has been treated for HPylori infection and placed on iron supplementation -   3. T2DM:followed by Endocrine  4. Lipids:  5. Hypothyroidism -  6. Iron deficiency anemia-  7. COVID-19 Education: The signs and symptoms of COVID-19 were to be discussed with the patient and how to seek care for testing (follow up with PCP or arrange E-visit).  The importance of social distancing, staying at home, hand hygiene and  wearing a mask when out in public were discussed today.  Patient Risk:   After full review of this patient's clinical status, I feel that they are at least moderate risk at this time.  Time:   Today, I have spent 0 minutes with the patient with telehealth technology discussing the above issues.     Medication Adjustments/Labs and Tests Ordered: Current medicines are reviewed at length with the patient today.  Concerns regarding medicines are outlined above.   Tests Ordered: No orders of the defined types were placed in this encounter.  Medication Changes: No orders of the defined types were placed in this encounter.   Disposition:    Patient is agreeable to this plan and will call if any problems develop in the interim.   Amie Critchley, NP  11/24/2018 7:28 AM    [facility]

## 2018-11-24 ENCOUNTER — Other Ambulatory Visit: Payer: Self-pay

## 2018-11-24 ENCOUNTER — Encounter: Payer: Self-pay | Admitting: Nurse Practitioner

## 2018-11-24 ENCOUNTER — Telehealth (INDEPENDENT_AMBULATORY_CARE_PROVIDER_SITE_OTHER): Payer: Medicaid Other | Admitting: Nurse Practitioner

## 2018-11-24 DIAGNOSIS — I10 Essential (primary) hypertension: Secondary | ICD-10-CM

## 2018-11-24 DIAGNOSIS — Z7189 Other specified counseling: Secondary | ICD-10-CM

## 2018-11-24 DIAGNOSIS — I2510 Atherosclerotic heart disease of native coronary artery without angina pectoris: Secondary | ICD-10-CM

## 2018-11-24 DIAGNOSIS — D509 Iron deficiency anemia, unspecified: Secondary | ICD-10-CM

## 2018-12-09 ENCOUNTER — Telehealth: Payer: Self-pay

## 2018-12-09 ENCOUNTER — Other Ambulatory Visit: Payer: Self-pay | Admitting: Endocrinology

## 2018-12-09 NOTE — Telephone Encounter (Signed)
    COVID-19 Pre-Screening Questions:  . In the past 7 to 10 days have you had a cough,  shortness of breath, headache, congestion, fever (100 or greater) body aches, chills, sore throat, or sudden loss of taste or sense of smell? . Have you been around anyone with known Covid 19. . Have you been around anyone who is awaiting Covid 19 test results in the past 7 to 10 days? . Have you been around anyone who has been exposed to Covid 19, or has mentioned symptoms of Covid 19 within the past 7 to 10 days?  If you have any concerns/questions about symptoms patients report during screening (either on the phone or at threshold). Contact the provider seeing the patient or DOD for further guidance.  If neither are available contact a member of the leadership team.       I called and spoke with patients son Ronal, ok per DPR. Ronal states patient has not had any symptoms and answered no to the above questions.

## 2018-12-13 NOTE — Progress Notes (Signed)
CARDIOLOGY OFFICE NOTE  Date:  12/14/2018    [name] Date of Birth: 1956-05-26 Medical Record #[id number]  PCP:  Shawna Clamp, MD  Cardiologist:  Marisa Cyphers    Chief Complaint  Patient presents with   Follow-up    Follow up visit - seen for Dr. Marlou Porch    History of Present Illness: [name] [name] is a 63 y.o. female who presents today for a follow up visit. Former patient of Dr. Claris Gladden - to transition to Dr. Marlou Porch however has not been seen yet. Has primarily seen me.   She has ahistory of diabetes, HTN, and CAD s/p DES tomLCxin the setting of inferior STEMI in 04/2013.EF was preserved.Other PMH includes HTN, T2 DM, hypothyroidism, anemiaand HLD.  Seen by me back in November of 2018 - she was leaving to return to [place] for several months. She was doing well. No PCP. Some mild anemia noted.She has been back and forth out of [place] - sometimes out of medicines - still issues with anemia. Last seen back in December - noted more anemia and got her to GI. She has had colonoscopy and EGD.  She was getting ready to travel again to [place].   The patient does not have symptoms concerning for COVID-19 infection (fever, chills, cough, or new shortness of breath).   Comes in today. Here with her son who helps translate. She is doing well. Little down from not being able to be out and about - not going to [place] - lots of COVID there. Remains off Plavix - due to marked anemia - found to be iron deficient - has had GI studies - treated with IV iron and treated for Hpylor. Her counts have improved. She has no chest pain. BP is good. Had a few days where it was a little higher but this was transient. Weight is down a few pounds. Overall, she feels like she is ok - she has had some general aching of shoulders/back/hips and knees - felt to be due to more sedentary lifestyle.   Past Medical History:  Diagnosis Date   Anemia    CAD (coronary artery disease)    a.  04/2013 Inf STEMI/PCI: LM nl, LAD min irregs, D1/2/3 nl, LCX 64m (2.5x18 Xience DES), OM1/2/3 nl, RCA nl, PDA nl, RPAV nl, RPL nl, EF 55%;  b. 04/2013 Echo: EF 55-60%, no rwma, mild MR, mildly dil LA.   Diabetes mellitus type 2, uncontrolled ([place])    a. 04/2013 HbA1c = 11.7.   Hyperlipidemia    Hypertension    Hypothyroidism     Past Surgical History:  Procedure Laterality Date   CESAREAN SECTION     LEFT HEART CATH  05-15-13   LEFT HEART CATHETERIZATION WITH CORONARY ANGIOGRAM N/A 05/15/2013   Procedure: LEFT HEART CATHETERIZATION WITH CORONARY ANGIOGRAM;  Surgeon: Wellington Hampshire, MD;  Location: [facility] CATH LAB;  Service: Cardiovascular;  Laterality: N/A;   PERCUTANEOUS STENT INTERVENTION  05/15/2013   Procedure: PERCUTANEOUS STENT INTERVENTION;  Surgeon: Wellington Hampshire, MD;  Location: [facility] CATH LAB;  Service: Cardiovascular;;     Medications: Current Meds  Medication Sig   acarbose (PRECOSE) 25 MG tablet TAKE 1 TABLET BY MOUTH AT BREAKFAST THEN TAKE 2 TABLETS BY MOUTH AT LUNCH AND 1 TABLET BY MOUTH AT DINNER   ACCU-CHEK FASTCLIX LANCETS MISC USE TO CHECK BLOOD SUGAR 3 TIMES PER DAY   aspirin EC 81 MG tablet Take 1 tablet (81 mg total) by mouth daily.  atorvastatin (LIPITOR) 80 MG tablet Take 1 tablet (80 mg total) by mouth daily at 6 PM.   gabapentin (NEURONTIN) 100 MG capsule TAKE 2 CAPSULES (200 MG TOTAL) BY MOUTH AT BEDTIME.   glucose blood (ACCU-CHEK SMARTVIEW) test strip UAD tid to monitor glucose; Dx: E11.65   INVOKANA 100 MG TABS tablet TAKE 1 TABLET (100 MG TOTAL) BY MOUTH DAILY.   levothyroxine (SYNTHROID, LEVOTHROID) 25 MCG tablet Take 1 tablet (25 mcg total) by mouth daily before breakfast.   lisinopril (PRINIVIL,ZESTRIL) 20 MG tablet Take 1 tablet (20 mg total) by mouth daily.   metFORMIN (GLUCOPHAGE-XR) 500 MG 24 hr tablet TAKE 4 TABLETS BY MOUTH DAILY WITH SUPPER   metoprolol tartrate (LOPRESSOR) 25 MG tablet TAKE ONE-HALF TABLET (12.5 MG TOTAL) BY  MOUTH 2 (TWO) TIMES DAILY   nitroGLYCERIN (NITROSTAT) 0.4 MG SL tablet Place 1 tablet (0.4 mg total) under the tongue every 5 (five) minutes as needed for chest pain.   pantoprazole (PROTONIX) 40 MG tablet Take 1 tablet (40 mg total) by mouth daily.     Allergies: No Known Allergies  Social History: The patient  reports that she has never smoked. She has never used smokeless tobacco. She reports that she does not drink alcohol or use drugs.   Family History: The patient's family history includes Diabetes in her brother, father, and mother; Heart disease in her father and mother; Hypertension in her father and mother.   Review of Systems: Please see the history of present illness.   All other systems are reviewed and negative.   Physical Exam: VS:  BP 128/80    Pulse 64    Ht 5\' 2"  (1.575 m)    Wt 127 lb 12.8 oz (58 kg)    SpO2 98%    BMI 23.37 kg/m  .  BMI Body mass index is 23.37 kg/m.  Wt Readings from Last 3 Encounters:  12/14/18 127 lb 12.8 oz (58 kg)  11/17/18 129 lb 6.4 oz (58.7 kg)  08/16/18 131 lb 9.6 oz (59.7 kg)    General: Pleasant. Alert and in no acute distress.  Her weight is down 7 pounds since my last visit.  HEENT: Normal.  Neck: Supple, no JVD, carotid bruits, or masses noted.  Cardiac: Regular rate and rhythm. No murmurs, rubs, or gallops. No edema.  Respiratory:  Lungs are clear to auscultation bilaterally with normal work of breathing.  GI: Soft and nontender.  MS: No deformity or atrophy. Gait and ROM intact.  Skin: Warm and dry. Color is normal.  Neuro:  Strength and sensation are intact and no gross focal deficits noted.  Psych: Alert, appropriate and with normal affect.   LABORATORY DATA:  EKG:  EKG is ordered today. This demonstrates NSR - nonspecific T wave changes - unchanged.  Lab Results  Component Value Date   WBC 5.3 11/03/2018   HGB 13.0 11/03/2018   HCT 39.1 11/03/2018   PLT 269.0 11/03/2018   GLUCOSE 111 (H) 11/15/2018   CHOL  103 05/25/2018   TRIG 112 05/25/2018   HDL 38 (L) 05/25/2018   LDLCALC 43 05/25/2018   ALT 12 11/15/2018   AST 14 11/15/2018   NA 139 11/15/2018   K 4.7 11/15/2018   CL 102 11/15/2018   CREATININE 0.70 11/15/2018   BUN 12 11/15/2018   CO2 27 11/15/2018   TSH 2.18 08/10/2018   HGBA1C 6.9 (H) 11/15/2018   MICROALBUR 2.3 (H) 08/10/2018     BNP (last 3 results) No results  for input(s): BNP in the last 8760 hours.  ProBNP (last 3 results) No results for input(s): PROBNP in the last 8760 hours.   Other Studies Reviewed Today:  EchoStudy Conclusionsfrom 2014  - Left ventricle: The cavity size was normal. Wall thickness was increased in a pattern of mild LVH. Systolic function was normal. The estimated ejection fraction was in the range of 55% to 60%. Wall motion was normal; there were no regional wall motion abnormalities. - Mitral valve: Mild regurgitation. - Left atrium: The atrium was mildly dilated. - Atrial septum: Redundant but no obvious PFO/ASD No defect or patent foramen ovale was identified.   Coronary angiography2014: Coronary dominance:Right   Left Main: Normal   Left Anterior Descending (LAD): Normal in size with minor irregularities.  1st diagonal (D1): Normal in size with no significant disease.  2nd diagonal (D2): Medium in size with no significant disease.  3rd diagonal (D3): Small in size with no significant disease.  Circumflex (LCx): Normal in size with minor irregularities proximally. There is a 99% hazy stenosis in the midsegment which is likely the culprit for inferior ST elevation MI. The stenosis is at the origin of OM 2.  1st obtuse marginal: Small in size with minor irregularities.  2nd obtuse marginal: Normal in size with minor irregularities.  3rd obtuse marginal: Normal in size with no significant disease.  Right Coronary Artery: normal in size with no significant disease.  Posterior descending  artery: Normal  Posterior AV segment: Small in size with no significant disease.  Posterolateral branchs: Small and normal posterolateral branches.  Left ventriculography: Left ventricular systolic function is normal , LVEF is estimated at 55 %, there is no significant mitral regurgitation   PCI Note:Following the diagnostic procedure, the decision was made to proceed with PCI. Weight-based bivalirudin was given for anticoagulation. Once a therapeutic ACT was achieved, a 6 [place] XB 3.0 guide catheter was inserted. A intuition coronary guidewire was used to cross the lesion. The lesion was predilated with a 2.5 x 12 balloon. The lesion was then stented with a 2.5 x 18 mm Xience expedition drug-eluting stent. There was a significant vessel mismatch between the proximal and the distal segment of the stent. The stent was postdilated with a 3.0 x 12 noncompliant balloon. Following PCI, there was 0% residual stenosis and TIMI-3 flow. Final angiography confirmed an excellent result. The patient tolerated the procedure well. There were no immediate procedural complications. A TR band was used for radial hemostasis. The patient was transferred to the post catheterization recovery area for further monitoring.  PCI Data: Vessel - left circumflex artery/Segment - mid Percent Stenosis (pre) 99% TIMI-flow 3 Stent : 2.5 x 18 mm Xience expedition drug-eluting stent postdilated with a 3.0 noncompliant balloon in the mid and proximal segment Percent Stenosis (post) 0% TIMI-flow (post) 3   Final Conclusions:  1.Severe one-vessel coronary artery disease with 99% hazy mid left circumflex stenosis which is the culprit for inferior ST elevation MI. 2. Normal LV systolic function and mildly elevated left ventricular end-diastolic pressure. 3. Successful angioplasty and drug-eluting stent placement to the left circumflex artery.  Recommendations:  Continue dual antiplatelet therapy for at least one  year. Aggressive treatment of risk factors is recommended.  Muhammad AridaMD, [facility] 05/15/2013, 2:51 AM   Assessment/Plan:  1.HTN - BP is fine - no changes made - had a few days where it was a little higher - this has not persisted - would follow - no changes made today.   2.CAD:has  had priorinferior STEMIin 2014 with PCI + DES to Lcx. Single vessel CAD. EF was normal.She was on chronic DAPT up until late last year - marked iron deficiency anemia - now just on aspirin. No active bleeding. Continue with current regimen and CV risk factor modification.  3. T2DM:followed by Endocrine  4. Lipids:remains on statin therapy.   5. Hypothyroidism -not discussed  6. Anemia-found to have marked anemia/iron deficiency - seeing GI - remains on therapy. Last count stable - just on aspirin.   7. COVID-19 Education: The signs and symptoms of COVID-19 were discussed with the patient and how to seek care for testing (follow up with PCP or arrange E-visit).  The importance of social distancing, staying at home, hand hygiene and wearing a mask when out in public were discussed today.  Current medicines are reviewed with the patient today.  The patient does not have concerns regarding medicines other than what has been noted above.  The following changes have been made:  See above.  Labs/ tests ordered today include:    Orders Placed This Encounter  Procedures   EKG 12-Lead     Disposition:   FU with me in 6 months with fasting labs.    Patient is agreeable to this plan and will call if any problems develop in the interim.   SignedTruitt Merle, NP  12/14/2018 10:52 AM  [facility] [address] [place], [place]  [address] Phone: [phone] Fax: [phone]

## 2018-12-14 ENCOUNTER — Other Ambulatory Visit: Payer: Self-pay

## 2018-12-14 ENCOUNTER — Ambulatory Visit (INDEPENDENT_AMBULATORY_CARE_PROVIDER_SITE_OTHER): Payer: Medicaid Other | Admitting: Nurse Practitioner

## 2018-12-14 ENCOUNTER — Encounter: Payer: Self-pay | Admitting: Nurse Practitioner

## 2018-12-14 VITALS — BP 128/80 | HR 64 | Ht 62.0 in | Wt 127.8 lb

## 2018-12-14 DIAGNOSIS — I10 Essential (primary) hypertension: Secondary | ICD-10-CM | POA: Diagnosis not present

## 2018-12-14 DIAGNOSIS — E785 Hyperlipidemia, unspecified: Secondary | ICD-10-CM

## 2018-12-14 DIAGNOSIS — Z7189 Other specified counseling: Secondary | ICD-10-CM | POA: Diagnosis not present

## 2018-12-14 DIAGNOSIS — D509 Iron deficiency anemia, unspecified: Secondary | ICD-10-CM

## 2018-12-14 DIAGNOSIS — I2510 Atherosclerotic heart disease of native coronary artery without angina pectoris: Secondary | ICD-10-CM | POA: Diagnosis not present

## 2018-12-14 NOTE — Patient Instructions (Addendum)
After Visit Summary:  We will be checking the following labs today - NONE   Medication Instructions:    Continue with your current medicines.    If you need a refill on your cardiac medications before your next appointment, please call your pharmacy.     Testing/Procedures To Be Arranged:  N/A  Follow-Up:   See me in 6 months with fasting labs    At [facility], you and your health needs are our priority.  As part of our continuing mission to provide you with exceptional heart care, we have created designated Provider Care Teams.  These Care Teams include your primary Cardiologist (physician) and Advanced Practice Providers (APPs -  Physician Assistants and Nurse Practitioners) who all work together to provide you with the care you need, when you need it.  Special Instructions:  . Stay safe, stay home, wash your hands for at least 20 seconds and wear a mask when out in public.  . It was good to talk with you today.  . Try to do more walking.    Call the [place] office at [phone] if you have any questions, problems or concerns.

## 2018-12-15 DIAGNOSIS — N6324 Unspecified lump in the left breast, lower inner quadrant: Secondary | ICD-10-CM | POA: Diagnosis not present

## 2018-12-15 DIAGNOSIS — R928 Other abnormal and inconclusive findings on diagnostic imaging of breast: Secondary | ICD-10-CM | POA: Diagnosis not present

## 2019-01-10 ENCOUNTER — Other Ambulatory Visit: Payer: Self-pay | Admitting: Endocrinology

## 2019-02-10 ENCOUNTER — Other Ambulatory Visit: Payer: Self-pay | Admitting: Endocrinology

## 2019-02-10 ENCOUNTER — Other Ambulatory Visit: Payer: Self-pay | Admitting: Nurse Practitioner

## 2019-02-14 ENCOUNTER — Other Ambulatory Visit (INDEPENDENT_AMBULATORY_CARE_PROVIDER_SITE_OTHER): Payer: Medicaid Other

## 2019-02-14 ENCOUNTER — Other Ambulatory Visit: Payer: Self-pay

## 2019-02-14 DIAGNOSIS — E063 Autoimmune thyroiditis: Secondary | ICD-10-CM | POA: Diagnosis not present

## 2019-02-14 DIAGNOSIS — E1140 Type 2 diabetes mellitus with diabetic neuropathy, unspecified: Secondary | ICD-10-CM | POA: Diagnosis not present

## 2019-02-14 LAB — COMPREHENSIVE METABOLIC PANEL
ALT: 13 U/L (ref 0–35)
AST: 14 U/L (ref 0–37)
Albumin: 4.1 g/dL (ref 3.5–5.2)
Alkaline Phosphatase: 35 U/L — ABNORMAL LOW (ref 39–117)
BUN: 10 mg/dL (ref 6–23)
CO2: 29 mEq/L (ref 19–32)
Calcium: 9.7 mg/dL (ref 8.4–10.5)
Chloride: 102 mEq/L (ref 96–112)
Creatinine, Ser: 0.69 mg/dL (ref 0.40–1.20)
GFR: 85.45 mL/min (ref >60.00)
Glucose, Bld: 124 mg/dL — ABNORMAL HIGH (ref 70–99)
Potassium: 4.3 mEq/L (ref 3.5–5.1)
Sodium: 140 mEq/L (ref 135–145)
Total Bilirubin: 0.5 mg/dL (ref 0.2–1.2)
Total Protein: 6.7 g/dL (ref 6.0–8.3)

## 2019-02-14 LAB — HEMOGLOBIN A1C: Hgb A1c MFr Bld: 7 % — ABNORMAL HIGH (ref 4.6–6.5)

## 2019-02-14 LAB — TSH: TSH: 3.32 u[IU]/mL (ref 0.35–4.50)

## 2019-02-17 ENCOUNTER — Ambulatory Visit (INDEPENDENT_AMBULATORY_CARE_PROVIDER_SITE_OTHER): Payer: Medicaid Other | Admitting: Endocrinology

## 2019-02-17 ENCOUNTER — Encounter: Payer: Self-pay | Admitting: Endocrinology

## 2019-02-17 ENCOUNTER — Other Ambulatory Visit: Payer: Self-pay

## 2019-02-17 VITALS — BP 140/70 | HR 64 | Ht 62.0 in | Wt 129.6 lb

## 2019-02-17 DIAGNOSIS — E1142 Type 2 diabetes mellitus with diabetic polyneuropathy: Secondary | ICD-10-CM | POA: Diagnosis not present

## 2019-02-17 DIAGNOSIS — D509 Iron deficiency anemia, unspecified: Secondary | ICD-10-CM | POA: Diagnosis not present

## 2019-02-17 DIAGNOSIS — E1140 Type 2 diabetes mellitus with diabetic neuropathy, unspecified: Secondary | ICD-10-CM

## 2019-02-17 DIAGNOSIS — E039 Hypothyroidism, unspecified: Secondary | ICD-10-CM | POA: Diagnosis not present

## 2019-02-17 DIAGNOSIS — E782 Mixed hyperlipidemia: Secondary | ICD-10-CM | POA: Diagnosis not present

## 2019-02-17 NOTE — Progress Notes (Signed)
Patient ID: [name] [name], female   DOB: 1955-12-23, 63 y.o.   MRN: [id number]           Reason for Appointment: Follow-up for Type 2 Diabetes   History of Present Illness:          Diagnosis: Type 2 diabetes mellitus, date of diagnosis: ?  2014   Past history:   Her son thinks that she may have had diabetes diagnosed when she was in [place] When she moved to [facility] in 2014 she was not on any medications She was however found to have significant hyperglycemia with glucose 364 and A1c of 11.7 when she was admitted for her coronary artery disease in 04/2013. At that time she was started on premixed insulin twice a day Her level of control had not been adequate with persistently high A1c readings She did not tolerate Victoza well and was having decreased appetite, malaise and nausea and this was stopped subsequently  Recent history:        Oral hypoglycemic drugs:     Metformin ER 500 mg 4 tablets daily, Invokana 100 mg daily, Precose 25 mg before meals   Her A1c has been stable, now 7%   Current blood sugar patterns and problems:  Her blood sugars at home are excellent with her trying to monitor her blood sugars at various times of the day including after meals  On her last visit she was having symptoms of decreased appetite and taste and she was told to leave off the acarbose for a few days.  However with this she did not feel any change and still is having the same symptoms which probably started several months ago  Her highest blood sugar is only 168 and she is having the highest blood sugars overall after dinner  She does try to walk inside her home but not any formal exercise  Her weight is about the same  Has been consistent with taking her medications as directed including Precose before the meals  No excessive yeast infection with Invokana  Hypoglycemia: None    Side effects from medications have been: None  Self-care: The diet that the patient has been  following is: None, has vegetarian diet Usually having vegetables and roti with lunch and dinner and sometimes having lentils.   She does not like yogurt as it apparently causes heartburn  Usually not eating fried food except for some snacks            Exercise:  Trying to walk indoors regularly      Dietician visit, none    Glucose monitoring:  done about 1-2 times a day       Glucometer: Accu-Chek  Blood Glucose readings by meter download and average readings as follows:   PRE-MEAL Fasting Lunch Dinner Bedtime Overall  Glucose range:       Mean/median:  118     119   POST-MEAL PC Breakfast PC Lunch PC Dinner  Glucose range:   95-168  75-149  Mean/median:   113  128   Previous readings:   PRE-MEAL Fasting Lunch Dinner Bedtime Overall  Glucose range:  98-143     80-157  Mean/median:      114   POST-MEAL PC Breakfast PC Lunch PC Dinner  Glucose range:  108-157  83-130  87-140  Mean/median:        Weight history:   Wt Readings from Last 3 Encounters:  02/17/19 129 lb 9.6 oz (58.8 kg)  12/14/18 127  lb 12.8 oz (58 kg)  11/17/18 129 lb 6.4 oz (58.7 kg)    Glycemic control:   Lab Results  Component Value Date   HGBA1C 7.0 (H) 02/14/2019   HGBA1C 6.9 (H) 11/15/2018   HGBA1C 6.9 (H) 08/10/2018   Lab Results  Component Value Date   MICROALBUR 2.3 (H) 08/10/2018   LDLCALC 43 05/25/2018   CREATININE 0.69 02/14/2019    Lab on 02/14/2019  Component Date Value Ref Range Status   TSH 02/14/2019 3.32  0.35 - 4.50 uIU/mL Final   Sodium 02/14/2019 140  135 - 145 mEq/L Final   Potassium 02/14/2019 4.3  3.5 - 5.1 mEq/L Final   Chloride 02/14/2019 102  96 - 112 mEq/L Final   CO2 02/14/2019 29  19 - 32 mEq/L Final   Glucose, Bld 02/14/2019 124* 70 - 99 mg/dL Final   BUN 02/14/2019 10  6 - 23 mg/dL Final   Creatinine, Ser 02/14/2019 0.69  0.40 - 1.20 mg/dL Final   Total Bilirubin 02/14/2019 0.5  0.2 - 1.2 mg/dL Final   Alkaline Phosphatase 02/14/2019 35* 39 -  117 U/L Final   AST 02/14/2019 14  0 - 37 U/L Final   ALT 02/14/2019 13  0 - 35 U/L Final   Total Protein 02/14/2019 6.7  6.0 - 8.3 g/dL Final   Albumin 02/14/2019 4.1  3.5 - 5.2 g/dL Final   Calcium 02/14/2019 9.7  8.4 - 10.5 mg/dL Final   GFR 02/14/2019 85.45  >60.00 mL/min Final   Hgb A1c MFr Bld 02/14/2019 7.0* 4.6 - 6.5 % Final   Glycemic Control Guidelines for People with Diabetes:Non Diabetic:  <6%Goal of Therapy: <7%Additional Action Suggested:  >8%       Allergies as of 02/17/2019   No Known Allergies     Medication List       Accurate as of February 17, 2019  4:41 PM. If you have any questions, ask your nurse or doctor.        acarbose 25 MG tablet Commonly known as: PRECOSE TAKE 1 TABLET BY MOUTH AT BREAKFAST THEN TAKE 2 TABLETS BY MOUTH AT LUNCH AND 1 TABLET BY MOUTH AT DINNER   Accu-Chek FastClix Lancets Misc USE TO CHECK BLOOD SUGAR 3 TIMES PER DAY   Accu-Chek SmartView test strip Generic drug: glucose blood USE THREE TIMES PER DAY TO CHECK BLOOD SUGAR   aspirin EC 81 MG tablet Take 1 tablet (81 mg total) by mouth daily.   atorvastatin 80 MG tablet Commonly known as: LIPITOR Take 1 tablet (80 mg total) by mouth daily at 6 PM.   gabapentin 100 MG capsule Commonly known as: NEURONTIN TAKE 2 CAPSULES (200 MG TOTAL) BY MOUTH AT BEDTIME.   Invokana 100 MG Tabs tablet Generic drug: canagliflozin TAKE 1 TABLET (100 MG TOTAL) BY MOUTH DAILY.   levothyroxine 25 MCG tablet Commonly known as: SYNTHROID Take 1 tablet (25 mcg total) by mouth daily before breakfast.   lisinopril 20 MG tablet Commonly known as: ZESTRIL Take 1 tablet (20 mg total) by mouth daily.   metFORMIN 500 MG 24 hr tablet Commonly known as: GLUCOPHAGE-XR TAKE 4 TABLETS BY MOUTH DAILY WITH SUPPER   metoprolol tartrate 25 MG tablet Commonly known as: LOPRESSOR TAKE ONE-HALF TABLET (12.5 MG TOTAL) BY MOUTH 2 (TWO) TIMES DAILY   nitroGLYCERIN 0.4 MG SL tablet Commonly known as:  NITROSTAT Place 1 tablet (0.4 mg total) under the tongue every 5 (five) minutes as needed for chest pain.  pantoprazole 40 MG tablet Commonly known as: PROTONIX TAKE 1 TABLET (40 MG TOTAL) BY MOUTH DAILY.       Allergies: No Known Allergies  Past Medical History:  Diagnosis Date   Anemia    CAD (coronary artery disease)    a. 04/2013 Inf STEMI/PCI: LM nl, LAD min irregs, D1/2/3 nl, LCX 33m (2.5x18 Xience DES), OM1/2/3 nl, RCA nl, PDA nl, RPAV nl, RPL nl, EF 55%;  b. 04/2013 Echo: EF 55-60%, no rwma, mild MR, mildly dil LA.   Diabetes mellitus type 2, uncontrolled ([place])    a. 04/2013 HbA1c = 11.7.   Hyperlipidemia    Hypertension    Hypothyroidism     Past Surgical History:  Procedure Laterality Date   CESAREAN SECTION     LEFT HEART CATH  05-15-13   LEFT HEART CATHETERIZATION WITH CORONARY ANGIOGRAM N/A 05/15/2013   Procedure: LEFT HEART CATHETERIZATION WITH CORONARY ANGIOGRAM;  Surgeon: Wellington Hampshire, MD;  Location: [facility] CATH [facility];  Service: Cardiovascular;  Laterality: N/A;   PERCUTANEOUS STENT INTERVENTION  05/15/2013   Procedure: PERCUTANEOUS STENT INTERVENTION;  Surgeon: Wellington Hampshire, MD;  Location: [facility] CATH [facility];  Service: Cardiovascular;;    Family History  Problem Relation Age of Onset   Heart disease Mother    Diabetes Mother    Hypertension Mother    Heart disease Father    Diabetes Father    Hypertension Father    Diabetes Brother     Social History:  reports that she has never smoked. She has never used smokeless tobacco. She reports that she does not drink alcohol or use drugs.    Review of Systems   HYPOTHYROIDISM: Screening TSH has been done because of her complaints of carpal tunnel syndrome  She does feel tired at times but is not complaining as much now Because of her high TSH she was told to start levothyroxine 25 mcg daily  She is taking this before breakfast daily with water No history of goiter  Last TSH:  Lab  Results  Component Value Date   TSH 3.32 02/14/2019   TSH 2.18 08/10/2018   TSH 2.46 04/26/2018   FREET4 0.68 09/03/2017     NEUROPATHY: She has mild intermittent symptoms of numbness in her hands and mostly when waking up  Her symptoms of burning in her lower arms especially in contact with clothes are controlled with gabapentin Periodically has nocturnal pains in her feet and lower legs with some stiffness   Lipids: She is treated with Lipitor 80 mg for several years Lipids are well controlled, has history of CAD followed by cardiologist      Lab Results  Component Value Date   CHOL 103 05/25/2018   HDL 38 (L) 05/25/2018   LDLCALC 43 05/25/2018   TRIG 112 05/25/2018   CHOLHDL 2.7 05/25/2018                 HYPERTENSION   The blood pressure has been treated with lisinopril 20 mg  Is using a blood pressure meter from [employer] for home monitoring She is also on Invokana    BP Readings from Last 3 Encounters:  02/17/19 140/70  12/14/18 128/80  11/17/18 120/70    LABS:  Lab on 02/14/2019  Component Date Value Ref Range Status   TSH 02/14/2019 3.32  0.35 - 4.50 uIU/mL Final   Sodium 02/14/2019 140  135 - 145 mEq/L Final   Potassium 02/14/2019 4.3  3.5 - 5.1 mEq/L Final  Chloride 02/14/2019 102  96 - 112 mEq/L Final   CO2 02/14/2019 29  19 - 32 mEq/L Final   Glucose, Bld 02/14/2019 124* 70 - 99 mg/dL Final   BUN 02/14/2019 10  6 - 23 mg/dL Final   Creatinine, Ser 02/14/2019 0.69  0.40 - 1.20 mg/dL Final   Total Bilirubin 02/14/2019 0.5  0.2 - 1.2 mg/dL Final   Alkaline Phosphatase 02/14/2019 35* 39 - 117 U/L Final   AST 02/14/2019 14  0 - 37 U/L Final   ALT 02/14/2019 13  0 - 35 U/L Final   Total Protein 02/14/2019 6.7  6.0 - 8.3 g/dL Final   Albumin 02/14/2019 4.1  3.5 - 5.2 g/dL Final   Calcium 02/14/2019 9.7  8.4 - 10.5 mg/dL Final   GFR 02/14/2019 85.45  >60.00 mL/min Final   Hgb A1c MFr Bld 02/14/2019 7.0* 4.6 - 6.5 % Final   Glycemic  Control Guidelines for People with Diabetes:Non Diabetic:  <6%Goal of Therapy: <7%Additional Action Suggested:  >8%     Physical Examination:  BP 140/70 (BP Location: Left Arm, Patient Position: Sitting, Cuff Size: Normal)    Pulse 64    Ht 5\' 2"  (1.575 m)    Wt 129 lb 9.6 oz (58.8 kg)    SpO2 98%    BMI 23.70 kg/m     ASSESSMENT:  Diabetes type 2, non-insulin requiring  See history of present illness for discussion of current diabetes management, blood sugar patterns and problems identified  A1c is unchanged at 7%  She is on Invokana, metformin and Precose  Blood sugars are consistent throughout the day Postprandial readings are controlled with Precose Fasting readings are relatively higher than normal averaging 118 but usually under 130  She is generally eating small portion Weight is stable  She will continue the same regimen now   Hypothyroidism: Mild and well controlled on 25 mcg levothyroxine and TSH consistently normal  Decreased appetite and loss of taste: Etiology unclear as this has been persistent without associated weight loss She can try to reduce her metformin to 500 mg for about a week or so to see if symptoms improve otherwise follow-up with her PCP   HYPERTENSION: Blood pressure is controlled  Continue lisinopril   PLAN:    As above She will call if she has improved appetite with stopping 3 of her metformin pills and may consider alternative  Given list of PCPs to establish with  Follow-up in 4 months   There are no Patient Instructions on file for this visit.  Elayne Snare 02/17/2019, 4:41 PM    Note: This office note was prepared with Dragon voice recognition system technology. Any transcriptional errors that result from this process are unintentional.

## 2019-02-28 ENCOUNTER — Telehealth: Payer: Self-pay | Admitting: Endocrinology

## 2019-02-28 ENCOUNTER — Other Ambulatory Visit: Payer: Self-pay

## 2019-02-28 NOTE — Telephone Encounter (Signed)
If her blood sugars are more than 120 in the morning she should start taking 1 tablet of metformin at bedtime only

## 2019-02-28 NOTE — Telephone Encounter (Signed)
Called pt's son and gave him MD message. He verbalized understanding of this.

## 2019-02-28 NOTE — Telephone Encounter (Signed)
Patients family member states since the patient has been off her metFORMIN (GLUCOPHAGE-XR) 500 MG 24 hr tablet for a week,  she has gotten her appetite back.  Please Advise, Thanks

## 2019-04-12 ENCOUNTER — Other Ambulatory Visit: Payer: Self-pay | Admitting: Endocrinology

## 2019-05-14 ENCOUNTER — Other Ambulatory Visit: Payer: Self-pay | Admitting: Endocrinology

## 2019-05-14 ENCOUNTER — Other Ambulatory Visit: Payer: Self-pay | Admitting: Nurse Practitioner

## 2019-05-14 DIAGNOSIS — I10 Essential (primary) hypertension: Secondary | ICD-10-CM

## 2019-05-19 NOTE — Progress Notes (Signed)
CARDIOLOGY OFFICE NOTE  Date:  05/24/2019    [name] [name] Date of Birth: 1955-12-09 Medical Record [id number]  PCP:  Shawna Clamp, MD  Cardiologist:  Marisa Cyphers   Chief Complaint  Patient presents with  . Follow-up    History of Present Illness: [name] [name] is a 63 y.o. female who presents today for a 6 month check.  Former patient of Dr. Claris Gladden - to transition to Dr. Marlou Porch however has not been seen yet.Has primarily seen me.  She has ahistory of diabetes, HTN, and CAD s/p DES tomLCxin the setting of inferior STEMI in 04/2013.EF was preserved.Other PMH includes HTN, T2 DM, hypothyroidism, anemiaand HLD.  Seen by me back in Novemberof 2018- she was leaving to return to [place] for several months. She was doing well. No PCP. Some mild anemia noted.She has been back and forth out of [place] - sometimes out of medicines - still issues with anemia. Finally got her to GI. She has had colonoscopy and EGD -found to be iron deficient.  Last seen in June - she was doing well but not getting out due to the pandemic.   The patient and son do not have symptoms concerning for COVID-19 infection (fever, chills, cough, or new shortness of breath).   Comes in today. Here with her son who translates. She is not getting out. Not very active at all with the pandemic. Tired most of the time. She has some various aches and pains in her back and shoulders - worse with movement - likes to soak in the tub in the hot water for relief. BP is ok. Not dizzy. She is fasting today.   Past Medical History:  Diagnosis Date  . Anemia   . CAD (coronary artery disease)    a. 04/2013 Inf STEMI/PCI: LM nl, LAD min irregs, D1/2/3 nl, LCX 91m (2.5x18 Xience DES), OM1/2/3 nl, RCA nl, PDA nl, RPAV nl, RPL nl, EF 55%;  b. 04/2013 Echo: EF 55-60%, no rwma, mild MR, mildly dil LA.  . Diabetes mellitus type 2, uncontrolled ([place])    a. 04/2013 HbA1c = 11.7.  . Hyperlipidemia   .  Hypertension   . Hypothyroidism     Past Surgical History:  Procedure Laterality Date  . CESAREAN SECTION    . LEFT HEART CATH  05-15-13  . LEFT HEART CATHETERIZATION WITH CORONARY ANGIOGRAM N/A 05/15/2013   Procedure: LEFT HEART CATHETERIZATION WITH CORONARY ANGIOGRAM;  Surgeon: Wellington Hampshire, MD;  Location: [facility] CATH [facility];  Service: Cardiovascular;  Laterality: N/A;  . PERCUTANEOUS STENT INTERVENTION  05/15/2013   Procedure: PERCUTANEOUS STENT INTERVENTION;  Surgeon: Wellington Hampshire, MD;  Location: [facility] CATH [facility];  Service: Cardiovascular;;     Medications: Current Meds  Medication Sig  . acarbose (PRECOSE) 25 MG tablet TAKE 1 TABLET BY MOUTH AT BREAKFAST THEN TAKE 2 TABLETS BY MOUTH AT LUNCH AND 1 TABLET BY MOUTH AT DINNER  . Accu-Chek FastClix Lancets MISC USE TO CHECK BLOOD SUGAR 3 TIMES PER DAY  . ACCU-CHEK SMARTVIEW test strip USE THREE TIMES PER DAY TO CHECK BLOOD SUGAR  . aspirin EC 81 MG tablet Take 1 tablet (81 mg total) by mouth daily.  Marland Kitchen atorvastatin (LIPITOR) 80 MG tablet TAKE 1 TABLET (80 MG TOTAL) BY MOUTH DAILY AT 6 PM.  . gabapentin (NEURONTIN) 100 MG capsule TAKE 2 CAPSULES BY MOUTH AT BEDTIME.  Marland Kitchen INVOKANA 100 MG TABS tablet TAKE 1 TABLET (100 MG TOTAL) BY MOUTH DAILY.  Marland Kitchen levothyroxine (  SYNTHROID) 25 MCG tablet TAKE 1 TABLET (25 MCG TOTAL) BY MOUTH DAILY BEFORE BREAKFAST.  Marland Kitchen lisinopril (ZESTRIL) 20 MG tablet TAKE ONE TABLET BY MOUTH ONCE DAILY  . metFORMIN (GLUCOPHAGE-XR) 500 MG 24 hr tablet TAKE 4 TABLETS BY MOUTH DAILY WITH SUPPER  . metoprolol tartrate (LOPRESSOR) 25 MG tablet TAKE ONE-HALF TABLET (1/2) BY MOUTH 2 (TWO) TIMES DAILY  . nitroGLYCERIN (NITROSTAT) 0.4 MG SL tablet Place 1 tablet (0.4 mg total) under the tongue every 5 (five) minutes as needed for chest pain.  . pantoprazole (PROTONIX) 40 MG tablet TAKE 1 TABLET (40 MG TOTAL) BY MOUTH DAILY.  . [DISCONTINUED] nitroGLYCERIN (NITROSTAT) 0.4 MG SL tablet Place 1 tablet (0.4 mg total) under the tongue every  5 (five) minutes as needed for chest pain.     Allergies: No Known Allergies  Social History: The patient  reports that she has never smoked. She has never used smokeless tobacco. She reports that she does not drink alcohol or use drugs.   Family History: The patient's family history includes Diabetes in her brother, father, and mother; Heart disease in her father and mother; Hypertension in her father and mother.   Review of Systems: Please see the history of present illness.   All other systems are reviewed and negative.   Physical Exam: VS:  BP 122/78   Pulse 70   Ht 5\' 2"  (1.575 m)   Wt 130 lb 1.9 oz (59 kg)   SpO2 97%   BMI 23.80 kg/m  .  BMI Body mass index is 23.8 kg/m.  Wt Readings from Last 3 Encounters:  05/24/19 130 lb 1.9 oz (59 kg)  02/17/19 129 lb 9.6 oz (58.8 kg)  12/14/18 127 lb 12.8 oz (58 kg)    General: Pleasant. Alert and in no acute distress. She does not speak [place].   HEENT: Normal.  Neck: Supple, no JVD, carotid bruits, or masses noted.  Cardiac: Regular rate and rhythm. No murmurs, rubs, or gallops. No edema.  Respiratory:  Lungs are clear to auscultation bilaterally with normal work of breathing.  GI: Soft and nontender.  MS: No deformity or atrophy. Gait and ROM intact.  Skin: Warm and dry. Color is normal.  Neuro:  Strength and sensation are intact and no gross focal deficits noted.  Psych: Alert, appropriate and with normal affect.   LABORATORY DATA:  EKG:  EKG is not ordered today.  Lab Results  Component Value Date   WBC 5.3 11/03/2018   HGB 13.0 11/03/2018   HCT 39.1 11/03/2018   PLT 269.0 11/03/2018   GLUCOSE 124 (H) 02/14/2019   CHOL 103 05/25/2018   TRIG 112 05/25/2018   HDL 38 (L) 05/25/2018   LDLCALC 43 05/25/2018   ALT 13 02/14/2019   AST 14 02/14/2019   NA 140 02/14/2019   K 4.3 02/14/2019   CL 102 02/14/2019   CREATININE 0.69 02/14/2019   BUN 10 02/14/2019   CO2 29 02/14/2019   TSH 3.32 02/14/2019   HGBA1C  7.0 (H) 02/14/2019   MICROALBUR 2.3 (H) 08/10/2018     BNP (last 3 results) No results for input(s): BNP in the last 8760 hours.  ProBNP (last 3 results) No results for input(s): PROBNP in the last 8760 hours.   Other Studies Reviewed Today:  EchoStudy Conclusionsfrom 2014  - Left ventricle: The cavity size was normal. Wall thickness was increased in a pattern of mild LVH. Systolic function was normal. The estimated ejection fraction was in the range of  55% to 60%. Wall motion was normal; there were no regional wall motion abnormalities. - Mitral valve: Mild regurgitation. - Left atrium: The atrium was mildly dilated. - Atrial septum: Redundant but no obvious PFO/ASD No defect or patent foramen ovale was identified.   Coronary angiography2014: Coronary dominance:Right   Left Main: Normal   Left Anterior Descending (LAD): Normal in size with minor irregularities.  1st diagonal (D1): Normal in size with no significant disease.  2nd diagonal (D2): Medium in size with no significant disease.  3rd diagonal (D3): Small in size with no significant disease.  Circumflex (LCx): Normal in size with minor irregularities proximally. There is a 99% hazy stenosis in the midsegment which is likely the culprit for inferior ST elevation MI. The stenosis is at the origin of OM 2.  1st obtuse marginal: Small in size with minor irregularities.  2nd obtuse marginal: Normal in size with minor irregularities.  3rd obtuse marginal: Normal in size with no significant disease.  Right Coronary Artery: normal in size with no significant disease.  Posterior descending artery: Normal  Posterior AV segment: Small in size with no significant disease.  Posterolateral branchs: Small and normal posterolateral branches.  Left ventriculography: Left ventricular systolic function is normal , LVEF is estimated at 55 %, there is no significant mitral regurgitation    PCI Note:Following the diagnostic procedure, the decision was made to proceed with PCI. Weight-based bivalirudin was given for anticoagulation. Once a therapeutic ACT was achieved, a 6 [place] XB 3.0 guide catheter was inserted. A intuition coronary guidewire was used to cross the lesion. The lesion was predilated with a 2.5 x 12 balloon. The lesion was then stented with a 2.5 x 18 mm Xience expedition drug-eluting stent. There was a significant vessel mismatch between the proximal and the distal segment of the stent. The stent was postdilated with a 3.0 x 12 noncompliant balloon. Following PCI, there was 0% residual stenosis and TIMI-3 flow. Final angiography confirmed an excellent result. The patient tolerated the procedure well. There were no immediate procedural complications. A TR band was used for radial hemostasis. The patient was transferred to the post catheterization recovery area for further monitoring.  PCI Data: Vessel - left circumflex artery/Segment - mid Percent Stenosis (pre) 99% TIMI-flow 3 Stent : 2.5 x 18 mm Xience expedition drug-eluting stent postdilated with a 3.0 noncompliant balloon in the mid and proximal segment Percent Stenosis (post) 0% TIMI-flow (post) 3   Final Conclusions:  1.Severe one-vessel coronary artery disease with 99% hazy mid left circumflex stenosis which is the culprit for inferior ST elevation MI. 2. Normal LV systolic function and mildly elevated left ventricular end-diastolic pressure. 3. Successful angioplasty and drug-eluting stent placement to the left circumflex artery.  Recommendations:  Continue dual antiplatelet therapy for at least one year. Aggressive treatment of risk factors is recommended.  Muhammad AridaMD, [facility] 05/15/2013, 2:51 AM   Assessment/Plan:  1. CAD - prior inferior STEMI in 2014 with PCI/DES to LCX- had single vessel disease and EF normal. She is no longer on DAPT - just aspirin due to her anemia. She  has no exertional symptoms. No NTG use - NTG is refilled for her today. CV risk factor modification is challenging with our current stay at home situation.   2. HTN - BP is ok - no changes made today   3. HLD - on statin - lab today. She is fasting.   4. Hypothyroid  5. Iron deficiency anemia - recheck CBC today.  6. DM - per Endocrine  7. Chronic back pain/general OA - probably exacerbated by her being so sedentary.   8. COVID-19 Education: The signs and symptoms of COVID-19 were discussed with the patient and how to seek care for testing (follow up with PCP or arrange E-visit).  The importance of social distancing, staying at home, hand hygiene and wearing a mask when out in public were discussed today.  Current medicines are reviewed with the patient today.  The patient does not have concerns regarding medicines other than what has been noted above.  The following changes have been made:  See above.  Labs/ tests ordered today include:    Orders Placed This Encounter  Procedures  . Basic metabolic panel  . CBC no Diff  . Hepatic function panel  . Lipid Profile     Disposition:   FU with me in 6 months.   Patient is agreeable to this plan and will call if any problems develop in the interim.   SignedTruitt Merle, NP  05/24/2019 8:25 AM  [facility] [address] [place], [place]  [address] Phone: [phone] Fax: [phone]

## 2019-05-24 ENCOUNTER — Ambulatory Visit: Payer: Medicaid Other | Admitting: Nurse Practitioner

## 2019-05-24 ENCOUNTER — Encounter: Payer: Self-pay | Admitting: Nurse Practitioner

## 2019-05-24 ENCOUNTER — Other Ambulatory Visit: Payer: Self-pay

## 2019-05-24 VITALS — BP 122/78 | HR 70 | Ht 62.0 in | Wt 130.1 lb

## 2019-05-24 DIAGNOSIS — D509 Iron deficiency anemia, unspecified: Secondary | ICD-10-CM | POA: Diagnosis not present

## 2019-05-24 DIAGNOSIS — I10 Essential (primary) hypertension: Secondary | ICD-10-CM | POA: Diagnosis not present

## 2019-05-24 DIAGNOSIS — Z7189 Other specified counseling: Secondary | ICD-10-CM | POA: Diagnosis not present

## 2019-05-24 DIAGNOSIS — E785 Hyperlipidemia, unspecified: Secondary | ICD-10-CM

## 2019-05-24 DIAGNOSIS — I2510 Atherosclerotic heart disease of native coronary artery without angina pectoris: Secondary | ICD-10-CM

## 2019-05-24 LAB — HEPATIC FUNCTION PANEL
ALT: 9 IU/L (ref 0–32)
AST: 14 IU/L (ref 0–40)
Albumin: 4.4 g/dL (ref 3.8–4.8)
Alkaline Phosphatase: 46 IU/L (ref 39–117)
Bilirubin Total: 0.5 mg/dL (ref 0.0–1.2)
Bilirubin, Direct: 0.15 mg/dL (ref 0.00–0.40)
Total Protein: 6.8 g/dL (ref 6.0–8.5)

## 2019-05-24 LAB — LIPID PANEL
Chol/HDL Ratio: 2.8 ratio (ref 0.0–4.4)
Cholesterol, Total: 91 mg/dL — ABNORMAL LOW (ref 100–199)
HDL: 32 mg/dL — ABNORMAL LOW (ref >39)
LDL Chol Calc (NIH): 41 mg/dL (ref 0–99)
Triglycerides: 94 mg/dL (ref 0–149)
VLDL Cholesterol Cal: 18 mg/dL (ref 5–40)

## 2019-05-24 LAB — CBC
Hematocrit: 38.9 % (ref 34.0–46.6)
Hemoglobin: 12.8 g/dL (ref 11.1–15.9)
MCH: 28.8 pg (ref 26.6–33.0)
MCHC: 32.9 g/dL (ref 31.5–35.7)
MCV: 87 fL (ref 79–97)
Platelets: 293 10*3/uL (ref 150–450)
RBC: 4.45 x10E6/uL (ref 3.77–5.28)
RDW: 13 % (ref 11.7–15.4)
WBC: 6 10*3/uL (ref 3.4–10.8)

## 2019-05-24 LAB — BASIC METABOLIC PANEL
BUN/Creatinine Ratio: 13 (ref 12–28)
BUN: 9 mg/dL (ref 8–27)
CO2: 23 mmol/L (ref 20–29)
Calcium: 10.2 mg/dL (ref 8.7–10.3)
Chloride: 101 mmol/L (ref 96–106)
Creatinine, Ser: 0.72 mg/dL (ref 0.57–1.00)
GFR calc Af Amer: 102 mL/min/{1.73_m2} (ref >59)
GFR calc non Af Amer: 88 mL/min/{1.73_m2} (ref >59)
Glucose: 119 mg/dL — ABNORMAL HIGH (ref 65–99)
Potassium: 4.8 mmol/L (ref 3.5–5.2)
Sodium: 139 mmol/L (ref 134–144)

## 2019-05-24 MED ORDER — NITROGLYCERIN 0.4 MG SL SUBL: 0.4000 mg | SUBLINGUAL_TABLET | SUBLINGUAL | 3 refills | Status: DC | PRN

## 2019-05-24 NOTE — Patient Instructions (Addendum)
After Visit Summary:  We will be checking the following labs today - BMET, CBC, HPF and Lipids   Medication Instructions:    Continue with your current medicines.   We refilled the NTG today.    If you need a refill on your cardiac medications before your next appointment, please call your pharmacy.     Testing/Procedures To Be Arranged:  N/A  Follow-Up:   See me in 6 months    At [facility], you and your health needs are our priority.  As part of our continuing mission to provide you with exceptional heart care, we have created designated Provider Care Teams.  These Care Teams include your primary Cardiologist (physician) and Advanced Practice Providers (APPs -  Physician Assistants and Nurse Practitioners) who all work together to provide you with the care you need, when you need it.  Special Instructions:  . Stay safe, stay home, wash your hands for at least 20 seconds and wear a mask when out in public.  . It was good to talk with you today.    Call the [place] office at [phone] if you have any questions, problems or concerns.

## 2019-05-27 NOTE — Progress Notes (Signed)
This has to be sent to patient PCP, I used to see this patient before, not anymore.

## 2019-06-13 ENCOUNTER — Other Ambulatory Visit: Payer: Self-pay | Admitting: Endocrinology

## 2019-06-20 ENCOUNTER — Other Ambulatory Visit: Payer: Self-pay

## 2019-06-20 ENCOUNTER — Other Ambulatory Visit (INDEPENDENT_AMBULATORY_CARE_PROVIDER_SITE_OTHER): Payer: Medicaid Other

## 2019-06-20 DIAGNOSIS — E1140 Type 2 diabetes mellitus with diabetic neuropathy, unspecified: Secondary | ICD-10-CM | POA: Diagnosis not present

## 2019-06-20 DIAGNOSIS — D509 Iron deficiency anemia, unspecified: Secondary | ICD-10-CM

## 2019-06-20 DIAGNOSIS — E039 Hypothyroidism, unspecified: Secondary | ICD-10-CM

## 2019-06-20 LAB — LIPID PANEL
Cholesterol: 91 mg/dL (ref 0–200)
HDL: 27.7 mg/dL — ABNORMAL LOW (ref >39.00)
LDL Cholesterol: 40 mg/dL (ref 0–99)
NonHDL: 63.56
Total CHOL/HDL Ratio: 3
Triglycerides: 118 mg/dL (ref 0.0–149.0)
VLDL: 23.6 mg/dL (ref 0.0–40.0)

## 2019-06-20 LAB — BASIC METABOLIC PANEL
BUN: 13 mg/dL (ref 6–23)
CO2: 28 mEq/L (ref 19–32)
Calcium: 9.6 mg/dL (ref 8.4–10.5)
Chloride: 101 mEq/L (ref 96–112)
Creatinine, Ser: 0.77 mg/dL (ref 0.40–1.20)
GFR: 75.21 mL/min (ref >60.00)
Glucose, Bld: 133 mg/dL — ABNORMAL HIGH (ref 70–99)
Potassium: 4.7 mEq/L (ref 3.5–5.1)
Sodium: 139 mEq/L (ref 135–145)

## 2019-06-20 LAB — MICROALBUMIN / CREATININE URINE RATIO
Creatinine,U: 130.7 mg/dL
Microalb Creat Ratio: 1.2 mg/g (ref 0.0–30.0)
Microalb, Ur: 1.5 mg/dL (ref 0.0–1.9)

## 2019-06-20 LAB — T4, FREE: Free T4: 0.96 ng/dL (ref 0.60–1.60)

## 2019-06-20 LAB — TSH: TSH: 3.18 u[IU]/mL (ref 0.35–4.50)

## 2019-06-20 LAB — VITAMIN B12: Vitamin B-12: 78 pg/mL — ABNORMAL LOW (ref 211–911)

## 2019-06-20 LAB — HEMOGLOBIN A1C: Hgb A1c MFr Bld: 7 % — ABNORMAL HIGH (ref 4.6–6.5)

## 2019-06-23 ENCOUNTER — Other Ambulatory Visit: Payer: Self-pay

## 2019-06-23 ENCOUNTER — Encounter: Payer: Self-pay | Admitting: Endocrinology

## 2019-06-23 ENCOUNTER — Ambulatory Visit (INDEPENDENT_AMBULATORY_CARE_PROVIDER_SITE_OTHER): Payer: Medicaid Other | Admitting: Endocrinology

## 2019-06-23 VITALS — BP 110/68 | HR 70 | Ht 62.0 in | Wt 131.4 lb

## 2019-06-23 DIAGNOSIS — I10 Essential (primary) hypertension: Secondary | ICD-10-CM | POA: Diagnosis not present

## 2019-06-23 DIAGNOSIS — E538 Deficiency of other specified B group vitamins: Secondary | ICD-10-CM | POA: Diagnosis not present

## 2019-06-23 DIAGNOSIS — E1140 Type 2 diabetes mellitus with diabetic neuropathy, unspecified: Secondary | ICD-10-CM

## 2019-06-23 DIAGNOSIS — E039 Hypothyroidism, unspecified: Secondary | ICD-10-CM | POA: Diagnosis not present

## 2019-06-23 NOTE — Patient Instructions (Addendum)
Start 2000ug  B12 once daily  For  yeast infection she will get the miconazole or any generic Monistat cream  Less sugar testing in am

## 2019-06-23 NOTE — Progress Notes (Signed)
Patient ID: [name] [name], female   DOB: 01-12-1956, 64 y.o.   MRN: [id number]           Reason for Appointment: Follow-up for Type 2 Diabetes   History of Present Illness:          Diagnosis: Type 2 diabetes mellitus, date of diagnosis: ?  2014   Past history:   Her son thinks that she may have had diabetes diagnosed when she was in [place] When she moved to [facility] in 2014 she was not on any medications She was however found to have significant hyperglycemia with glucose 364 and A1c of 11.7 when she was admitted for her coronary artery disease in 04/2013. At that time she was started on premixed insulin twice a day Her level of control had not been adequate with persistently high A1c readings She did not tolerate Victoza well and was having decreased appetite, malaise and nausea and this was stopped subsequently  Recent history:        Oral hypoglycemic drugs:     Metformin ER 500 mg 4 tablets daily, Invokana 100 mg daily, Precose 25 mg before meals   Her A1c has been stable, again 7%   Current blood sugar patterns and problems:  She was asked to try leaving off her Metformin for a week after her last visit to see if she would have better appetite but she did not feel any different  She has excellent blood sugar throughout the day with only mild fluctuation  Usually eating small portions with most of her meals although it may not always get protein with every meal  Postprandial readings are averaging below 140 on an average  She does try to walk inside her home, cannot do much exercise otherwise  Her weight is very slightly on the uptrend  She tries to take her Precose before starting to eat  Although she has been given Diflucan periodically for yeast infection she says that she tends to have persistent itching in the vaginal area  Hypoglycemia: None    Side effects from medications have been: None  Self-care: The diet that the patient has been following  is: None, has vegetarian diet Usually having vegetables and roti with lunch and dinner and sometimes having lentils.   She does not like yogurt as it apparently causes heartburn  Usually not eating fried food except for some snacks            Exercise:  Trying to walk indoors regularly      Dietician visit, none    Glucose monitoring:  done about 1-2 times a day       Glucometer: Accu-Chek  Blood Glucose readings by meter download and average readings as follows:   PRE-MEAL Fasting Lunch Dinner Bedtime Overall  Glucose range:      68-169  Mean/median:  120     126   POST-MEAL PC Breakfast PC Lunch PC Dinner  Glucose range:     Mean/median:   126  131   Previous readings:   PRE-MEAL Fasting Lunch Dinner Bedtime Overall  Glucose range:       Mean/median:  118     119   POST-MEAL PC Breakfast PC Lunch PC Dinner  Glucose range:   95-168  75-149  Mean/median:   113  128     Weight history:   Wt Readings from Last 3 Encounters:  06/23/19 131 lb 6.4 oz (59.6 kg)  05/24/19 130 lb 1.9 oz (59 kg)  02/17/19 129 lb 9.6 oz (58.8 kg)    Glycemic control:   Lab Results  Component Value Date   HGBA1C 7.0 (H) 06/20/2019   HGBA1C 7.0 (H) 02/14/2019   HGBA1C 6.9 (H) 11/15/2018   Lab Results  Component Value Date   MICROALBUR 1.5 06/20/2019   LDLCALC 40 06/20/2019   CREATININE 0.77 06/20/2019    Lab on 06/20/2019  Component Date Value Ref Range Status  . Vitamin B-12 06/20/2019 78* 211 - 911 pg/mL Final  . Free T4 06/20/2019 0.96  0.60 - 1.60 ng/dL Final   Comment: Specimens from patients who are undergoing biotin therapy and /or ingesting biotin supplements may contain high levels of biotin.  The higher biotin concentration in these specimens interferes with this Free T4 assay.  Specimens that contain high levels  of biotin may cause false high results for this Free T4 assay.  Please interpret results in light of the total clinical presentation of the patient.    Marland Kitchen TSH  06/20/2019 3.18  0.35 - 4.50 uIU/mL Final  . Cholesterol 06/20/2019 91  0 - 200 mg/dL Final   ATP III Classification       Desirable:  < 200 mg/dL               Borderline High:  200 - 239 mg/dL          High:  > = 240 mg/dL  . Triglycerides 06/20/2019 118.0  0.0 - 149.0 mg/dL Final   Normal:  <150 mg/dLBorderline High:  150 - 199 mg/dL  . HDL 06/20/2019 27.70* >39.00 mg/dL Final  . VLDL 06/20/2019 23.6  0.0 - 40.0 mg/dL Final  . LDL Cholesterol 06/20/2019 40  0 - 99 mg/dL Final  . Total CHOL/HDL Ratio 06/20/2019 3   Final                  Men          Women1/2 Average Risk     3.4          3.3Average Risk          5.0          4.42X Average Risk          9.6          7.13X Average Risk          15.0          11.0                      . NonHDL 06/20/2019 63.56   Final   NOTE:  Non-HDL goal should be 30 mg/dL higher than patient's LDL goal (i.e. LDL goal of < 70 mg/dL, would have non-HDL goal of < 100 mg/dL)  . Microalb, Ur 06/20/2019 1.5  0.0 - 1.9 mg/dL Final  . Creatinine,U 06/20/2019 130.7  mg/dL Final  . Microalb Creat Ratio 06/20/2019 1.2  0.0 - 30.0 mg/g Final  . Sodium 06/20/2019 139  135 - 145 mEq/L Final  . Potassium 06/20/2019 4.7  3.5 - 5.1 mEq/L Final  . Chloride 06/20/2019 101  96 - 112 mEq/L Final  . CO2 06/20/2019 28  19 - 32 mEq/L Final  . Glucose, Bld 06/20/2019 133* 70 - 99 mg/dL Final  . BUN 06/20/2019 13  6 - 23 mg/dL Final  . Creatinine, Ser 06/20/2019 0.77  0.40 - 1.20 mg/dL Final  . GFR 06/20/2019 75.21  >60.00 mL/min Final  . Calcium 06/20/2019 9.6  8.4 - 10.5 mg/dL Final  . Hgb A1c MFr Bld 06/20/2019 7.0* 4.6 - 6.5 % Final   Glycemic Control Guidelines for People with Diabetes:Non Diabetic:  <6%Goal of Therapy: <7%Additional Action Suggested:  >8%       Allergies as of 06/23/2019   No Known Allergies     Medication List       Accurate as of June 23, 2019  4:33 PM. If you have any questions, ask your nurse or doctor.        acarbose 25 MG tablet  Commonly known as: PRECOSE TAKE 1 TABLET BY MOUTH AT BREAKFAST THEN TAKE 2 TABLETS BY MOUTH AT LUNCH AND 1 TABLET BY MOUTH AT DINNER   Accu-Chek FastClix Lancets Misc USE TO CHECK BLOOD SUGAR 3 TIMES PER DAY   Accu-Chek SmartView test strip Generic drug: glucose blood USE THREE TIMES PER DAY TO CHECK BLOOD SUGAR   aspirin EC 81 MG tablet Take 1 tablet (81 mg total) by mouth daily.   atorvastatin 80 MG tablet Commonly known as: LIPITOR TAKE 1 TABLET (80 MG TOTAL) BY MOUTH DAILY AT 6 PM.   gabapentin 100 MG capsule Commonly known as: NEURONTIN TAKE 2 CAPSULES BY MOUTH AT BEDTIME.   Invokana 100 MG Tabs tablet Generic drug: canagliflozin TAKE 1 TABLET (100 MG TOTAL) BY MOUTH DAILY.   levothyroxine 25 MCG tablet Commonly known as: SYNTHROID TAKE 1 TABLET (25 MCG TOTAL) BY MOUTH DAILY BEFORE BREAKFAST.   lisinopril 20 MG tablet Commonly known as: ZESTRIL TAKE ONE TABLET BY MOUTH ONCE DAILY   metFORMIN 500 MG 24 hr tablet Commonly known as: GLUCOPHAGE-XR TAKE 4 TABLETS BY MOUTH DAILY WITH SUPPER   metoprolol tartrate 25 MG tablet Commonly known as: LOPRESSOR TAKE ONE-HALF TABLET (1/2) BY MOUTH 2 (TWO) TIMES DAILY   nitroGLYCERIN 0.4 MG SL tablet Commonly known as: NITROSTAT Place 1 tablet (0.4 mg total) under the tongue every 5 (five) minutes as needed for chest pain.   pantoprazole 40 MG tablet Commonly known as: PROTONIX TAKE 1 TABLET (40 MG TOTAL) BY MOUTH DAILY.       Allergies: No Known Allergies  Past Medical History:  Diagnosis Date  . Anemia   . CAD (coronary artery disease)    a. 04/2013 Inf STEMI/PCI: LM nl, LAD min irregs, D1/2/3 nl, LCX 92m (2.5x18 Xience DES), OM1/2/3 nl, RCA nl, PDA nl, RPAV nl, RPL nl, EF 55%;  b. 04/2013 Echo: EF 55-60%, no rwma, mild MR, mildly dil LA.  . Diabetes mellitus type 2, uncontrolled ([place])    a. 04/2013 HbA1c = 11.7.  . Hyperlipidemia   . Hypertension   . Hypothyroidism     Past Surgical History:  Procedure  Laterality Date  . CESAREAN SECTION    . LEFT HEART CATH  05-15-13  . LEFT HEART CATHETERIZATION WITH CORONARY ANGIOGRAM N/A 05/15/2013   Procedure: LEFT HEART CATHETERIZATION WITH CORONARY ANGIOGRAM;  Surgeon: Wellington Hampshire, MD;  Location: [facility] C[facility];  Service: Cardiovascular;  Laterality: N/A;  . PERCUTANEOUS STENT INTERVENTION  05/15/2013   Procedure: PERCUTANEOUS STENT INTERVENTION;  Surgeon: Wellington Hampshire, MD;  Location: [facility];  Service: Cardiovascular;;    Family History  Problem Relation Age of Onset  . Heart disease Mother   . Diabetes Mother   . Hypertension Mother   . Heart disease Father   . Diabetes Father   . Hypertension Father   . Diabetes Brother     Social History:  reports that she has  never smoked. She has never used smokeless tobacco. She reports that she does not drink alcohol or use drugs.    Review of Systems   HYPOTHYROIDISM: Screening TSH had been done because of her complaints of carpal tunnel syndrome  She does feel tired at times  Because of her high TSH she was told to start levothyroxine 25 mcg daily  She is taking this before breakfast daily With levothyroxine she may be feeling slightly less tired and TSH is consistently normal  No history of goiter  Last TSH:  Lab Results  Component Value Date   TSH 3.18 06/20/2019   TSH 3.32 02/14/2019   TSH 2.18 08/10/2018   FREET4 0.96 06/20/2019   FREET4 0.68 09/03/2017     NEUROPATHY: She has  symptoms of numbness and some tingling in her hands and more when waking up Also has symptoms of burning in her lower arms especially in contact with clothes Feet may also have some pain at night Symptoms are improved with gabapentin but still persistent  Lipids: She is treated with Lipitor 80 mg for several years Lipids are well controlled, has history of CAD followed by cardiologist      Lab Results  Component Value Date   CHOL 91 06/20/2019   HDL 27.70 (L) 06/20/2019   LDLCALC 40  06/20/2019   TRIG 118.0 06/20/2019   CHOLHDL 3 06/20/2019                 HYPERTENSION   The blood pressure has been treated with lisinopril 20 mg  Has a blood pressure meter from [employer] for home monitoring although has not checked recently She is also on Invokana    BP Readings from Last 3 Encounters:  06/23/19 110/68  05/24/19 122/78  02/17/19 140/70   She had a screening B12 level done because of neuropathic symptoms and long-term Metformin use and this is low  Lab Results  Component Value Date   VITAMINB12 78 (L) 06/20/2019     Lab Results  Component Value Date   HGB 12.8 05/24/2019    LABS:  Lab on 06/20/2019  Component Date Value Ref Range Status  . Vitamin B-12 06/20/2019 78* 211 - 911 pg/mL Final  . Free T4 06/20/2019 0.96  0.60 - 1.60 ng/dL Final   Comment: Specimens from patients who are undergoing biotin therapy and /or ingesting biotin supplements may contain high levels of biotin.  The higher biotin concentration in these specimens interferes with this Free T4 assay.  Specimens that contain high levels  of biotin may cause false high results for this Free T4 assay.  Please interpret results in light of the total clinical presentation of the patient.    Marland Kitchen TSH 06/20/2019 3.18  0.35 - 4.50 uIU/mL Final  . Cholesterol 06/20/2019 91  0 - 200 mg/dL Final   ATP III Classification       Desirable:  < 200 mg/dL               Borderline High:  200 - 239 mg/dL          High:  > = 240 mg/dL  . Triglycerides 06/20/2019 118.0  0.0 - 149.0 mg/dL Final   Normal:  <150 mg/dLBorderline High:  150 - 199 mg/dL  . HDL 06/20/2019 27.70* >39.00 mg/dL Final  . VLDL 06/20/2019 23.6  0.0 - 40.0 mg/dL Final  . LDL Cholesterol 06/20/2019 40  0 - 99 mg/dL Final  . Total CHOL/HDL Ratio 06/20/2019 3  Final                  Men          Women1/2 Average Risk     3.4          3.3Average Risk          5.0          4.42X Average Risk          9.6          7.13X Average Risk          15.0           11.0                      . NonHDL 06/20/2019 63.56   Final   NOTE:  Non-HDL goal should be 30 mg/dL higher than patient's LDL goal (i.e. LDL goal of < 70 mg/dL, would have non-HDL goal of < 100 mg/dL)  . Microalb, Ur 06/20/2019 1.5  0.0 - 1.9 mg/dL Final  . Creatinine,U 06/20/2019 130.7  mg/dL Final  . Microalb Creat Ratio 06/20/2019 1.2  0.0 - 30.0 mg/g Final  . Sodium 06/20/2019 139  135 - 145 mEq/L Final  . Potassium 06/20/2019 4.7  3.5 - 5.1 mEq/L Final  . Chloride 06/20/2019 101  96 - 112 mEq/L Final  . CO2 06/20/2019 28  19 - 32 mEq/L Final  . Glucose, Bld 06/20/2019 133* 70 - 99 mg/dL Final  . BUN 06/20/2019 13  6 - 23 mg/dL Final  . Creatinine, Ser 06/20/2019 0.77  0.40 - 1.20 mg/dL Final  . GFR 06/20/2019 75.21  >60.00 mL/min Final  . Calcium 06/20/2019 9.6  8.4 - 10.5 mg/dL Final  . Hgb A1c MFr Bld 06/20/2019 7.0* 4.6 - 6.5 % Final   Glycemic Control Guidelines for People with Diabetes:Non Diabetic:  <6%Goal of Therapy: <7%Additional Action Suggested:  >8%     Physical Examination:  BP 110/68 (BP Location: Left Arm, Patient Position: Sitting, Cuff Size: Normal)   Pulse 70   Ht 5\' 2"  (1.575 m)   Wt 131 lb 6.4 oz (59.6 kg)   SpO2 98%   BMI 24.03 kg/m     ASSESSMENT:  Diabetes type 2, non-insulin requiring  See history of present illness for discussion of current diabetes management, blood sugar patterns and problems identified  A1c is unchanged at 7%  She is on Invokana, metformin 2 g daily and Precose  She has maintained her level of control and blood sugars including postprandially are well regulated Fasting blood sugars are averaging about 120 She is fairly consistent with taking her medications as directed also  She will continue her regimen unchanged  Hypothyroidism: Mild and well controlled on 25 mcg levothyroxine  TSH consistently normal  Decreased appetite and loss of taste: Etiology unclear as this has been present for quite some time and  does not appear to be reduced by holding her medications including Precose and Metformin She will need to discuss with PCP especially since her son thinks that it started at least partly after tooth extraction  Vitamin B12 deficiency: She does appear to have a very low vitamin B12 level which may or may not be related to her Metformin treatment  HYPERTENSION: Blood pressure is well controlled on lisinopril Microalbumin normal  PLAN:   She can try using Monistat for persistent symptoms of genital itching She can cut back on monitoring blood sugars in the mornings  and 2 more readings after meals Encouraged her to walk regularly  Restart checking blood pressure at home and if low normal may consider reducing lisinopril She does need to start vitamin B12 2000 mcg daily Discussed that if her levels are not  Given list of PCPs to establish with  Follow-up in 4 months   Patient Instructions  Start 2000ug  B12 once daily  For  yeast infection she will get the miconazole or any generic Monistat cream  Less sugar testing in am      Elayne Snare 06/23/2019, 4:33 PM    Note: This office note was prepared with Dragon voice recognition system technology. Any transcriptional errors that result from this process are unintentional.

## 2019-06-24 NOTE — Progress Notes (Signed)
I used to be her PCP. I don't see her anymore.

## 2019-07-11 DIAGNOSIS — N6324 Unspecified lump in the left breast, lower inner quadrant: Secondary | ICD-10-CM | POA: Diagnosis not present

## 2019-07-11 DIAGNOSIS — R928 Other abnormal and inconclusive findings on diagnostic imaging of breast: Secondary | ICD-10-CM | POA: Diagnosis not present

## 2019-08-09 ENCOUNTER — Other Ambulatory Visit: Payer: Self-pay | Admitting: Endocrinology

## 2019-09-08 ENCOUNTER — Other Ambulatory Visit: Payer: Self-pay | Admitting: Endocrinology

## 2019-09-21 DIAGNOSIS — Z23 Encounter for immunization: Secondary | ICD-10-CM | POA: Diagnosis not present

## 2019-09-26 ENCOUNTER — Other Ambulatory Visit (INDEPENDENT_AMBULATORY_CARE_PROVIDER_SITE_OTHER): Payer: Medicaid Other

## 2019-09-26 ENCOUNTER — Other Ambulatory Visit: Payer: Self-pay

## 2019-09-26 DIAGNOSIS — E538 Deficiency of other specified B group vitamins: Secondary | ICD-10-CM | POA: Diagnosis not present

## 2019-09-26 DIAGNOSIS — E1140 Type 2 diabetes mellitus with diabetic neuropathy, unspecified: Secondary | ICD-10-CM

## 2019-09-26 DIAGNOSIS — E039 Hypothyroidism, unspecified: Secondary | ICD-10-CM | POA: Diagnosis not present

## 2019-09-26 LAB — CBC
HCT: 38.2 % (ref 36.0–46.0)
Hemoglobin: 12.5 g/dL (ref 12.0–15.0)
MCHC: 32.8 g/dL (ref 30.0–36.0)
MCV: 88.1 fl (ref 78.0–100.0)
Platelets: 227 10*3/uL (ref 150.0–400.0)
RBC: 4.34 Mil/uL (ref 3.87–5.11)
RDW: 14.2 % (ref 11.5–15.5)
WBC: 4.2 10*3/uL (ref 4.0–10.5)

## 2019-09-26 LAB — COMPREHENSIVE METABOLIC PANEL
ALT: 15 U/L (ref 0–35)
AST: 14 U/L (ref 0–37)
Albumin: 4.2 g/dL (ref 3.5–5.2)
Alkaline Phosphatase: 27 U/L — ABNORMAL LOW (ref 39–117)
BUN: 10 mg/dL (ref 6–23)
CO2: 27 mEq/L (ref 19–32)
Calcium: 9.4 mg/dL (ref 8.4–10.5)
Chloride: 104 mEq/L (ref 96–112)
Creatinine, Ser: 0.74 mg/dL (ref 0.40–1.20)
GFR: 78.67 mL/min (ref >60.00)
Glucose, Bld: 131 mg/dL — ABNORMAL HIGH (ref 70–99)
Potassium: 4.9 mEq/L (ref 3.5–5.1)
Sodium: 139 mEq/L (ref 135–145)
Total Bilirubin: 0.6 mg/dL (ref 0.2–1.2)
Total Protein: 6.7 g/dL (ref 6.0–8.3)

## 2019-09-26 LAB — TSH: TSH: 3.71 u[IU]/mL (ref 0.35–4.50)

## 2019-09-26 LAB — HEMOGLOBIN A1C: Hgb A1c MFr Bld: 7 % — ABNORMAL HIGH (ref 4.6–6.5)

## 2019-09-26 LAB — VITAMIN B12: Vitamin B-12: >1500 pg/mL — ABNORMAL HIGH (ref 211–911)

## 2019-09-29 ENCOUNTER — Ambulatory Visit (INDEPENDENT_AMBULATORY_CARE_PROVIDER_SITE_OTHER): Payer: Medicaid Other | Admitting: Endocrinology

## 2019-09-29 ENCOUNTER — Encounter: Payer: Self-pay | Admitting: Endocrinology

## 2019-09-29 ENCOUNTER — Other Ambulatory Visit: Payer: Self-pay

## 2019-09-29 VITALS — BP 110/70 | HR 68 | Ht 62.0 in | Wt 133.0 lb

## 2019-09-29 DIAGNOSIS — I10 Essential (primary) hypertension: Secondary | ICD-10-CM | POA: Diagnosis not present

## 2019-09-29 DIAGNOSIS — E1140 Type 2 diabetes mellitus with diabetic neuropathy, unspecified: Secondary | ICD-10-CM | POA: Diagnosis not present

## 2019-09-29 DIAGNOSIS — E039 Hypothyroidism, unspecified: Secondary | ICD-10-CM

## 2019-09-29 DIAGNOSIS — E538 Deficiency of other specified B group vitamins: Secondary | ICD-10-CM | POA: Diagnosis not present

## 2019-09-29 DIAGNOSIS — E782 Mixed hyperlipidemia: Secondary | ICD-10-CM

## 2019-09-29 NOTE — Patient Instructions (Signed)
Check blood sugars on waking up days a week  Also check blood sugars about 2 hours after meals and do this after different meals by rotation  Recommended blood sugar levels on waking up are 90-130 and about 2 hours after meal is 130-160  Please bring your blood sugar monitor to each visit, thank you   

## 2019-09-29 NOTE — Progress Notes (Signed)
Patient ID: [name] [name], female   DOB: 13-Nov-1955, 64 y.o.   MRN: [id number]           Reason for Appointment: Follow-up    History of Present Illness:          Diagnosis: Type 2 diabetes mellitus, date of diagnosis: ?  2014   Past history:   Her son thinks that she may have had diabetes diagnosed when she was in [place] When she moved to [facility] in 2014 she was not on any medications She was however found to have significant hyperglycemia with glucose 364 and A1c of 11.7 when she was admitted for her coronary artery disease in 04/2013. At that time she was started on premixed insulin twice a day Her level of control had not been adequate with persistently high A1c readings She did not tolerate Victoza well and was having decreased appetite, malaise and nausea and this was stopped subsequently  Recent history:        Oral hypoglycemic drugs:     Metformin ER 500 mg 3 tablets daily, Invokana 100 mg daily, Precose 25 mg before meals   Her A1c has been stable, again 7%   Current blood sugar patterns and problems:  She is taking Metformin with each meal  Previously may have had some decreased appetite with the highest dose  Her weight stays about the same  Recently has been trying to do more walking although having some pains on walking  No excessive yeast infections with Invokana now  Blood sugars are fairly controlled throughout the day with only some high readings overnight, not clear if she is having some snacks during the night since bedtime readings are not significantly high  Has been able to take her Precose before starting to eat fairly regularly  Although she has been given Diflucan periodically for yeast infection she says that she tends to have persistent itching in the vaginal area  Hypoglycemia: None    Side effects from medications have been: None  Self-care: The diet that the patient has been following is: None, has vegetarian diet Usually  having vegetables and roti with lunch and dinner and sometimes having lentils.   She does not like yogurt as it apparently causes heartburn  Usually not eating fried food             Exercise:  Trying to walk  regularly      Dietician visit, none    Glucose monitoring:  done about 1-2 times a day       Glucometer: Accu-Chek  Blood Glucose readings by meter download and average readings as follows:   PRE-MEAL Fasting Lunch Dinner  overnight Overall  Glucose range:     79-175  Mean/median:  104  128  125  141  124   POST-MEAL PC Breakfast PC Lunch PC Dinner  Glucose range:     Mean/median:    123    Previous readings:  PRE-MEAL Fasting Lunch Dinner Bedtime Overall  Glucose range:      68-169  Mean/median:  120     126   POST-MEAL PC Breakfast PC Lunch PC Dinner  Glucose range:     Mean/median:   126  131     Weight history:   Wt Readings from Last 3 Encounters:  09/29/19 133 lb (60.3 kg)  06/23/19 131 lb 6.4 oz (59.6 kg)  05/24/19 130 lb 1.9 oz (59 kg)    Glycemic control:   Lab Results  Component  Value Date   HGBA1C 7.0 (H) 09/26/2019   HGBA1C 7.0 (H) 06/20/2019   HGBA1C 7.0 (H) 02/14/2019   Lab Results  Component Value Date   MICROALBUR 1.5 06/20/2019   LDLCALC 40 06/20/2019   CREATININE 0.74 09/26/2019    Lab on 09/26/2019  Component Date Value Ref Range Status  . Vitamin B-12 09/26/2019 >1500* 211 - 911 pg/mL Final  . WBC 09/26/2019 4.2  4.0 - 10.5 K/uL Final  . RBC 09/26/2019 4.34  3.87 - 5.11 Mil/uL Final  . Platelets 09/26/2019 227.0  150.0 - 400.0 K/uL Final  . Hemoglobin 09/26/2019 12.5  12.0 - 15.0 g/dL Final  . HCT 09/26/2019 38.2  36.0 - 46.0 % Final  . MCV 09/26/2019 88.1  78.0 - 100.0 fl Final  . MCHC 09/26/2019 32.8  30.0 - 36.0 g/dL Final  . RDW 09/26/2019 14.2  11.5 - 15.5 % Final  . TSH 09/26/2019 3.71  0.35 - 4.50 uIU/mL Final  . Sodium 09/26/2019 139  135 - 145 mEq/L Final  . Potassium 09/26/2019 4.9  3.5 - 5.1 mEq/L Final  .  Chloride 09/26/2019 104  96 - 112 mEq/L Final  . CO2 09/26/2019 27  19 - 32 mEq/L Final  . Glucose, Bld 09/26/2019 131* 70 - 99 mg/dL Final  . BUN 09/26/2019 10  6 - 23 mg/dL Final  . Creatinine, Ser 09/26/2019 0.74  0.40 - 1.20 mg/dL Final  . Total Bilirubin 09/26/2019 0.6  0.2 - 1.2 mg/dL Final  . Alkaline Phosphatase 09/26/2019 27* 39 - 117 U/L Final  . AST 09/26/2019 14  0 - 37 U/L Final  . ALT 09/26/2019 15  0 - 35 U/L Final  . Total Protein 09/26/2019 6.7  6.0 - 8.3 g/dL Final  . Albumin 09/26/2019 4.2  3.5 - 5.2 g/dL Final  . GFR 09/26/2019 78.67  >60.00 mL/min Final  . Calcium 09/26/2019 9.4  8.4 - 10.5 mg/dL Final  . Hgb A1c MFr Bld 09/26/2019 7.0* 4.6 - 6.5 % Final   Glycemic Control Guidelines for People with Diabetes:Non Diabetic:  <6%Goal of Therapy: <7%Additional Action Suggested:  >8%       Allergies as of 09/29/2019   No Known Allergies     Medication List       Accurate as of September 29, 2019 11:56 AM. If you have any questions, ask your nurse or doctor.        acarbose 25 MG tablet Commonly known as: PRECOSE TAKE 1 TABLET BY MOUTH AT BREAKFAST THEN TAKE 2 TABLETS BY MOUTH AT LUNCH AND 1 TABLET BY MOUTH AT DINNER   Accu-Chek FastClix Lancets Misc USE TO CHECK BLOOD SUGAR 3 TIMES PER DAY   Accu-Chek SmartView test strip Generic drug: glucose blood USE THREE TIMES PER DAY TO CHECK BLOOD SUGAR   aspirin EC 81 MG tablet Take 1 tablet (81 mg total) by mouth daily.   atorvastatin 80 MG tablet Commonly known as: LIPITOR TAKE 1 TABLET (80 MG TOTAL) BY MOUTH DAILY AT 6 PM.   gabapentin 100 MG capsule Commonly known as: NEURONTIN TAKE 2 CAPSULES BY MOUTH AT BEDTIME.   Invokana 100 MG Tabs tablet Generic drug: canagliflozin TAKE 1 TABLET (100 MG TOTAL) BY MOUTH DAILY.   levothyroxine 25 MCG tablet Commonly known as: SYNTHROID TAKE 1 TABLET (25 MCG TOTAL) BY MOUTH DAILY BEFORE BREAKFAST.   lisinopril 20 MG tablet Commonly known as: ZESTRIL TAKE ONE  TABLET BY MOUTH ONCE DAILY   metFORMIN 500 MG 24  hr tablet Commonly known as: GLUCOPHAGE-XR TAKE 4 TABLETS BY MOUTH DAILY WITH SUPPER   metoprolol tartrate 25 MG tablet Commonly known as: LOPRESSOR TAKE ONE-HALF TABLET (1/2) BY MOUTH 2 (TWO) TIMES DAILY   nitroGLYCERIN 0.4 MG SL tablet Commonly known as: NITROSTAT Place 1 tablet (0.4 mg total) under the tongue every 5 (five) minutes as needed for chest pain.   pantoprazole 40 MG tablet Commonly known as: PROTONIX TAKE 1 TABLET (40 MG TOTAL) BY MOUTH DAILY.       Allergies: No Known Allergies  Past Medical History:  Diagnosis Date  . Anemia   . CAD (coronary artery disease)    a. 04/2013 Inf STEMI/PCI: LM nl, LAD min irregs, D1/2/3 nl, LCX 36m (2.5x18 Xience DES), OM1/2/3 nl, RCA nl, PDA nl, RPAV nl, RPL nl, EF 55%;  b. 04/2013 Echo: EF 55-60%, no rwma, mild MR, mildly dil LA.  . Diabetes mellitus type 2, uncontrolled ([place])    a. 04/2013 HbA1c = 11.7.  . Hyperlipidemia   . Hypertension   . Hypothyroidism     Past Surgical History:  Procedure Laterality Date  . CESAREAN SECTION    . LEFT HEART CATH  05-15-13  . LEFT HEART CATHETERIZATION WITH CORONARY ANGIOGRAM N/A 05/15/2013   Procedure: LEFT HEART CATHETERIZATION WITH CORONARY ANGIOGRAM;  Surgeon: Wellington Hampshire, MD;  Location: [place] CATH LAB;  Service: Cardiovascular;  Laterality: N/A;  . PERCUTANEOUS STENT INTERVENTION  05/15/2013   Procedure: PERCUTANEOUS STENT INTERVENTION;  Surgeon: Wellington Hampshire, MD;  Location: [place] C[facility];  Service: Cardiovascular;;    Family History  Problem Relation Age of Onset  . Heart disease Mother   . Diabetes Mother   . Hypertension Mother   . Heart disease Father   . Diabetes Father   . Hypertension Father   . Diabetes Brother     Social History:  reports that she has never smoked. She has never used smokeless tobacco. She reports that she does not drink alcohol or use drugs.    Review of Systems    HYPOTHYROIDISM: Screening TSH had been done because of her complaints of carpal tunnel syndrome and some fatigue Because of her high TSH she was told to start levothyroxine 25 mcg daily  She is taking this before eating in the morning daily  TSH is consistently normal  No history of goiter  Last TSH:  Lab Results  Component Value Date   TSH 3.71 09/26/2019   TSH 3.18 06/20/2019   TSH 3.32 02/14/2019   FREET4 0.96 06/20/2019   FREET4 0.68 09/03/2017     NEUROPATHY: She has  symptoms of numbness and some tingling in her hands and usually when waking up Has only minimal pains in her feet or burning  Symptoms are improved with gabapentin but still persistent  Also asking about pain in the back of her legs in the lower part on walking including back of the heel  Lipids: She is treated with Lipitor 80 mg for several years Lipids are well controlled, has history of CAD followed by cardiologist      Lab Results  Component Value Date   CHOL 91 06/20/2019   HDL 27.70 (L) 06/20/2019   LDLCALC 40 06/20/2019   TRIG 118.0 06/20/2019   CHOLHDL 3 06/20/2019                 HYPERTENSION   The blood pressure has been controlled with lisinopril 20 mg  Has a blood pressure meter from [employer]  for home monitoring She is also on Invokana    BP Readings from Last 3 Encounters:  09/29/19 110/70  06/23/19 110/68  05/24/19 122/78   She had a screening B12 level done because of neuropathic symptoms and long-term Metformin use and this is low at baseline but now it is higher from the current dose of her supplement  Lab Results  Component Value Date   VITAMINB12 >1500 (H) 09/26/2019    No history of anemia  Lab Results  Component Value Date   HGB 12.5 09/26/2019    She had the J&J Covid vaccine about a week ago and has done well so far  LABS:  Lab on 09/26/2019  Component Date Value Ref Range Status  . Vitamin B-12 09/26/2019 >1500* 211 - 911 pg/mL Final  . WBC  09/26/2019 4.2  4.0 - 10.5 K/uL Final  . RBC 09/26/2019 4.34  3.87 - 5.11 Mil/uL Final  . Platelets 09/26/2019 227.0  150.0 - 400.0 K/uL Final  . Hemoglobin 09/26/2019 12.5  12.0 - 15.0 g/dL Final  . HCT 09/26/2019 38.2  36.0 - 46.0 % Final  . MCV 09/26/2019 88.1  78.0 - 100.0 fl Final  . MCHC 09/26/2019 32.8  30.0 - 36.0 g/dL Final  . RDW 09/26/2019 14.2  11.5 - 15.5 % Final  . TSH 09/26/2019 3.71  0.35 - 4.50 uIU/mL Final  . Sodium 09/26/2019 139  135 - 145 mEq/L Final  . Potassium 09/26/2019 4.9  3.5 - 5.1 mEq/L Final  . Chloride 09/26/2019 104  96 - 112 mEq/L Final  . CO2 09/26/2019 27  19 - 32 mEq/L Final  . Glucose, Bld 09/26/2019 131* 70 - 99 mg/dL Final  . BUN 09/26/2019 10  6 - 23 mg/dL Final  . Creatinine, Ser 09/26/2019 0.74  0.40 - 1.20 mg/dL Final  . Total Bilirubin 09/26/2019 0.6  0.2 - 1.2 mg/dL Final  . Alkaline Phosphatase 09/26/2019 27* 39 - 117 U/L Final  . AST 09/26/2019 14  0 - 37 U/L Final  . ALT 09/26/2019 15  0 - 35 U/L Final  . Total Protein 09/26/2019 6.7  6.0 - 8.3 g/dL Final  . Albumin 09/26/2019 4.2  3.5 - 5.2 g/dL Final  . GFR 09/26/2019 78.67  >60.00 mL/min Final  . Calcium 09/26/2019 9.4  8.4 - 10.5 mg/dL Final  . Hgb A1c MFr Bld 09/26/2019 7.0* 4.6 - 6.5 % Final   Glycemic Control Guidelines for People with Diabetes:Non Diabetic:  <6%Goal of Therapy: <7%Additional Action Suggested:  >8%     Physical Examination:  BP 110/70 (BP Location: Left Arm, Patient Position: Sitting, Cuff Size: Normal)   Pulse 68   Ht 5\' 2"  (1.575 m)   Wt 133 lb (60.3 kg)   SpO2 98%   BMI 24.33 kg/m    Diabetic Foot Exam - Simple   Simple Foot Form Diabetic Foot exam was performed with the following findings: Yes   Visual Inspection No deformities, no ulcerations, no other skin breakdown bilaterally: Yes Sensation Testing Intact to touch and monofilament testing bilaterally: Yes Pulse Check Posterior Tibialis and Dorsalis pulse intact bilaterally: Yes Comments      ASSESSMENT:  Diabetes type 2, non-insulin requiring  See history of present illness for discussion of current diabetes management, blood sugar patterns and problems identified  A1c is unchanged at 7%  She is on Invokana, metformin 1.5 g daily and Precose  She has fairly stable blood sugar control and minimal fluctuation except some occasional high  readings postprandially including with snacks at night Weight is only slightly higher  She will continue her 3 drug regimen unchanged  Hypothyroidism: May have been subclinical but TSH is normal again on 25 mcg levothyroxine and she will continue  Carpal tunnel syndrome and neuropathy symptoms: She will discuss further with PCP especially her symptoms in the hands  She does have nonspecific leg pain which is unrelated to neuropathy or vascular disease  Vitamin B12 deficiency: She is taking an unknown dose of her supplement and since her level is over 1500 she can cut down to half the dose   HYPERTENSION: Blood pressure is consistently controlled on lisinopril Microalbumin has been normal  PLAN:   Discussed when to check blood sugars and timing of blood sugars as well as blood sugar targets Make sure she takes because at the start of each meal Avoid high carbohydrate meals or snacks  Her medications will be continued unchanged  Follow-up in 4 months   Patient Instructions  Check blood sugars on waking up days a week  Also check blood sugars about 2 hours after meals and do this after different meals by rotation  Recommended blood sugar levels on waking up are 90-130 and about 2 hours after meal is 130-160  Please bring your blood sugar monitor to each visit, thank you     Elayne Snare 09/29/2019, 11:56 AM    Note: This office note was prepared with Dragon voice recognition system technology. Any transcriptional errors that result from this process are unintentional.

## 2019-10-06 DIAGNOSIS — I10 Essential (primary) hypertension: Secondary | ICD-10-CM | POA: Diagnosis not present

## 2019-10-06 DIAGNOSIS — E7849 Other hyperlipidemia: Secondary | ICD-10-CM | POA: Diagnosis not present

## 2019-10-06 DIAGNOSIS — E119 Type 2 diabetes mellitus without complications: Secondary | ICD-10-CM | POA: Diagnosis not present

## 2019-10-06 DIAGNOSIS — M25551 Pain in right hip: Secondary | ICD-10-CM | POA: Diagnosis not present

## 2019-10-07 ENCOUNTER — Other Ambulatory Visit: Payer: Self-pay | Admitting: Endocrinology

## 2019-11-16 NOTE — Progress Notes (Signed)
CARDIOLOGY OFFICE NOTE  Date:  11/29/2019    [name] Date of Birth: [birth year] Medical Record [id number]  PCP:  Dixie Dials, MD  Cardiologist:  Marisa Cyphers    Chief Complaint  Patient presents with  . Follow-up    History of Present Illness: Raesha Borin is a 64 y.o. female who presents today for a follow up visit.  Former patient of Dr. Claris Gladden - to transition to Dr. Marlou Porch however has not been seen yet.Has primarily seen me.  She has ahistory of diabetes, HTN, and CAD s/p DES tomLCxin the setting of inferior STEMI back in 04/2013.EF was preserved.Other PMH includes HTN, T2 DM, hypothyroidism, anemiaand HLD.She has been found to have anemia - ended up referring to GI and found to be iron deficient. No longer on Plavix. Last seen in December by me. Not getting out due to the pandemic - some fatigue and various aches and pain but cardiac status felt to be ok.   The patient does not have symptoms concerning for COVID-19 infection (fever, chills, cough, or new shortness of breath).   Comes in today. Here with her son who helps translate for her and augments the history. She has been vaccinated.  She is doing well. No chest pain. Breathing is ok. Limited walking due to lots of orthopedic issues/aches/pains in her back and legs. Weight is stable. No bleeding. No falls. Overall, he feels like she is doing well.   Past Medical History:  Diagnosis Date  . Anemia   . CAD (coronary artery disease)    a. 04/2013 Inf STEMI/PCI: LM nl, LAD min irregs, D1/2/3 nl, LCX 46m (2.5x18 Xience DES), OM1/2/3 nl, RCA nl, PDA nl, RPAV nl, RPL nl, EF 55%;  b. 04/2013 Echo: EF 55-60%, no rwma, mild MR, mildly dil LA.  . Diabetes mellitus type 2, uncontrolled ([place])    a. 04/2013 HbA1c = 11.7.  . Hyperlipidemia   . Hypertension   . Hypothyroidism     Past Surgical History:  Procedure Laterality Date  . CESAREAN SECTION    . LEFT HEART CATH  05-15-13  . LEFT HEART  CATHETERIZATION WITH CORONARY ANGIOGRAM N/A 05/15/2013   Procedure: LEFT HEART CATHETERIZATION WITH CORONARY ANGIOGRAM;  Surgeon: Wellington Hampshire, MD;  Location: [place] CATH [facility];  Service: Cardiovascular;  Laterality: N/A;  . PERCUTANEOUS STENT INTERVENTION  05/15/2013   Procedure: PERCUTANEOUS STENT INTERVENTION;  Surgeon: Wellington Hampshire, MD;  Location: [facility] C[facility];  Service: Cardiovascular;;     Medications: Current Meds  Medication Sig  . acarbose (PRECOSE) 25 MG tablet TAKE 1 TABLET BY MOUTH AT BREAKFAST THEN TAKE 2 TABLETS BY MOUTH AT LUNCH AND 1 TABLET BY MOUTH AT DINNER  . Accu-Chek FastClix Lancets MISC USE TO CHECK BLOOD SUGAR 3 TIMES PER DAY  . ACCU-CHEK SMARTVIEW test strip USE THREE TIMES PER DAY TO CHECK BLOOD SUGAR  . aspirin EC 81 MG tablet Take 1 tablet (81 mg total) by mouth daily.  Marland Kitchen atorvastatin (LIPITOR) 80 MG tablet TAKE 1 TABLET (80 MG TOTAL) BY MOUTH DAILY AT 6 PM.  . gabapentin (NEURONTIN) 100 MG capsule TAKE 2 CAPSULES BY MOUTH AT BEDTIME.  Marland Kitchen INVOKANA 100 MG TABS tablet TAKE 1 TABLET (100 MG TOTAL) BY MOUTH DAILY.  Marland Kitchen levothyroxine (SYNTHROID) 25 MCG tablet TAKE 1 TABLET (25 MCG TOTAL) BY MOUTH DAILY BEFORE BREAKFAST.  Marland Kitchen lisinopril (ZESTRIL) 20 MG tablet TAKE ONE TABLET BY MOUTH ONCE DAILY  . metFORMIN (GLUCOPHAGE-XR) 500 MG 24  hr tablet TAKE 4 TABLETS BY MOUTH DAILY WITH SUPPER  . metoprolol tartrate (LOPRESSOR) 25 MG tablet TAKE ONE-HALF TABLET (1/2) BY MOUTH 2 (TWO) TIMES DAILY  . nitroGLYCERIN (NITROSTAT) 0.4 MG SL tablet Place 1 tablet (0.4 mg total) under the tongue every 5 (five) minutes as needed for chest pain.  . pantoprazole (PROTONIX) 40 MG tablet TAKE 1 TABLET (40 MG TOTAL) BY MOUTH DAILY.     Allergies: No Known Allergies  Social History: The patient  reports that she has never smoked. She has never used smokeless tobacco. She reports that she does not drink alcohol and does not use drugs.   Family History: The patient's family history includes  Diabetes in her brother, father, and mother; Heart disease in her father and mother; Hypertension in her father and mother.   Review of Systems: Please see the history of present illness.   All other systems are reviewed and negative.   Physical Exam: VS:  BP 106/70   Pulse 66   Ht 5\' 2"  (1.575 m)   Wt 130 lb 12.8 oz (59.3 kg)   SpO2 98%   BMI 23.92 kg/m  .  BMI Body mass index is 23.92 kg/m.  Wt Readings from Last 3 Encounters:  11/29/19 130 lb 12.8 oz (59.3 kg)  09/29/19 133 lb (60.3 kg)  06/23/19 131 lb 6.4 oz (59.6 kg)    General: Pleasant. Alert and in no acute distress.   Cardiac: Regular rate and rhythm. No murmurs, rubs, or gallops. No edema.  Respiratory:  Lungs are clear to auscultation bilaterally with normal work of breathing.  GI: Soft and nontender.  MS: No deformity or atrophy. Gait and ROM intact.  Skin: Warm and dry. Color is normal.  Neuro:  Strength and sensation are intact and no gross focal deficits noted.  Psych: Alert, appropriate and with normal affect.   LABORATORY DATA:  EKG:  EKG is ordered today.  Personally reviewed by me. This demonstrates sinus rhythm with inferolateral T wave changes. HR is 66. Tracing unchanged.   Lab Results  Component Value Date   WBC 4.2 09/26/2019   HGB 12.5 09/26/2019   HCT 38.2 09/26/2019   PLT 227.0 09/26/2019   GLUCOSE 131 (H) 09/26/2019   CHOL 91 06/20/2019   TRIG 118.0 06/20/2019   HDL 27.70 (L) 06/20/2019   LDLCALC 40 06/20/2019   ALT 15 09/26/2019   AST 14 09/26/2019   NA 139 09/26/2019   K 4.9 09/26/2019   CL 104 09/26/2019   CREATININE 0.74 09/26/2019   BUN 10 09/26/2019   CO2 27 09/26/2019   TSH 3.71 09/26/2019   HGBA1C 7.0 (H) 09/26/2019   MICROALBUR 1.5 06/20/2019     BNP (last 3 results) No results for input(s): BNP in the last 8760 hours.  ProBNP (last 3 results) No results for input(s): PROBNP in the last 8760 hours.   Other Studies Reviewed Today:  EchoStudy Conclusionsfrom  2014  - Left ventricle: The cavity size was normal. Wall thickness was increased in a pattern of mild LVH. Systolic function was normal. The estimated ejection fraction was in the range of 55% to 60%. Wall motion was normal; there were no regional wall motion abnormalities. - Mitral valve: Mild regurgitation. - Left atrium: The atrium was mildly dilated. - Atrial septum: Redundant but no obvious PFO/ASD No defect or patent foramen ovale was identified.   Coronary angiography2014: Coronary dominance:Right   Left Main: Normal   Left Anterior Descending (LAD): Normal in size  with minor irregularities.  1st diagonal (D1): Normal in size with no significant disease.  2nd diagonal (D2): Medium in size with no significant disease.  3rd diagonal (D3): Small in size with no significant disease.  Circumflex (LCx): Normal in size with minor irregularities proximally. There is a 99% hazy stenosis in the midsegment which is likely the culprit for inferior ST elevation MI. The stenosis is at the origin of OM 2.  1st obtuse marginal: Small in size with minor irregularities.  2nd obtuse marginal: Normal in size with minor irregularities.  3rd obtuse marginal: Normal in size with no significant disease.  Right Coronary Artery: normal in size with no significant disease.  Posterior descending artery: Normal  Posterior AV segment: Small in size with no significant disease.  Posterolateral branchs: Small and normal posterolateral branches.  Left ventriculography: Left ventricular systolic function is normal , LVEF is estimated at 55 %, there is no significant mitral regurgitation   PCI Note:Following the diagnostic procedure, the decision was made to proceed with PCI. Weight-based bivalirudin was given for anticoagulation. Once a therapeutic ACT was achieved, a 6 [place] XB 3.0 guide catheter was inserted. A intuition coronary guidewire was used to cross  the lesion. The lesion was predilated with a 2.5 x 12 balloon. The lesion was then stented with a 2.5 x 18 mm Xience expedition drug-eluting stent. There was a significant vessel mismatch between the proximal and the distal segment of the stent. The stent was postdilated with a 3.0 x 12 noncompliant balloon. Following PCI, there was 0% residual stenosis and TIMI-3 flow. Final angiography confirmed an excellent result. The patient tolerated the procedure well. There were no immediate procedural complications. A TR band was used for radial hemostasis. The patient was transferred to the post catheterization recovery area for further monitoring.  PCI Data: Vessel - left circumflex artery/Segment - mid Percent Stenosis (pre) 99% TIMI-flow 3 Stent : 2.5 x 18 mm Xience expedition drug-eluting stent postdilated with a 3.0 noncompliant balloon in the mid and proximal segment Percent Stenosis (post) 0% TIMI-flow (post) 3   Final Conclusions:  1.Severe one-vessel coronary artery disease with 99% hazy mid left circumflex stenosis which is the culprit for inferior ST elevation MI. 2. Normal LV systolic function and mildly elevated left ventricular end-diastolic pressure. 3. Successful angioplasty and drug-eluting stent placement to the left circumflex artery.  Recommendations:  Continue dual antiplatelet therapy for at least one year. Aggressive treatment of risk factors is recommended.  Muhammad AridaMD, [facility] 05/15/2013, 2:51 AM   Assessment/Plan:  1. CAD - prior inferior STEMI from 2014 with PCI/DES to LCX - had single vessel disease at that time and EF was normal. Only on aspirin. Continue statin and beta blocker therapy.   2. HTN - BP is fine - no changes made today - would continue Lopressor and Lisinopril.    3. HLD - on statin - lab today.   4. History of anemia - CBC today.   5. DM - per PCP  6. Chronic back pain/general OA - activity encouraged. Ok to use Voltaren gel. May  need to see Ortho - [facility] contact given.   Current medicines are reviewed with the patient today.  The patient does not have concerns regarding medicines other than what has been noted above.  The following changes have been made:  See above.  Labs/ tests ordered today include:    Orders Placed This Encounter  Procedures  . EKG 12-Lead     Disposition:  FU with me in 6 months. Labs today. CV risk factor modification encouraged.    Patient is agreeable to this plan and will call if any problems develop in the interim.   SignedTruitt Merle, NP  11/29/2019 8:26 AM  [facility] [address] [place],   [address] Phone: [phone] Fax: [phone]

## 2019-11-29 ENCOUNTER — Ambulatory Visit: Payer: Medicaid Other | Admitting: Nurse Practitioner

## 2019-11-29 ENCOUNTER — Encounter: Payer: Self-pay | Admitting: Nurse Practitioner

## 2019-11-29 ENCOUNTER — Other Ambulatory Visit: Payer: Self-pay

## 2019-11-29 VITALS — BP 106/70 | HR 66 | Ht 62.0 in | Wt 130.8 lb

## 2019-11-29 DIAGNOSIS — I2510 Atherosclerotic heart disease of native coronary artery without angina pectoris: Secondary | ICD-10-CM | POA: Diagnosis not present

## 2019-11-29 DIAGNOSIS — I10 Essential (primary) hypertension: Secondary | ICD-10-CM

## 2019-11-29 DIAGNOSIS — D509 Iron deficiency anemia, unspecified: Secondary | ICD-10-CM | POA: Diagnosis not present

## 2019-11-29 DIAGNOSIS — E785 Hyperlipidemia, unspecified: Secondary | ICD-10-CM

## 2019-11-29 LAB — HEPATIC FUNCTION PANEL
ALT: 12 IU/L (ref 0–32)
AST: 16 IU/L (ref 0–40)
Albumin: 4.6 g/dL (ref 3.8–4.8)
Alkaline Phosphatase: 40 IU/L — ABNORMAL LOW (ref 48–121)
Bilirubin Total: 0.5 mg/dL (ref 0.0–1.2)
Bilirubin, Direct: 0.17 mg/dL (ref 0.00–0.40)
Total Protein: 7 g/dL (ref 6.0–8.5)

## 2019-11-29 LAB — BASIC METABOLIC PANEL
BUN/Creatinine Ratio: 11 — ABNORMAL LOW (ref 12–28)
BUN: 9 mg/dL (ref 8–27)
CO2: 24 mmol/L (ref 20–29)
Calcium: 10.4 mg/dL — ABNORMAL HIGH (ref 8.7–10.3)
Chloride: 100 mmol/L (ref 96–106)
Creatinine, Ser: 0.83 mg/dL (ref 0.57–1.00)
GFR calc Af Amer: 86 mL/min/{1.73_m2} (ref >59)
GFR calc non Af Amer: 74 mL/min/{1.73_m2} (ref >59)
Glucose: 144 mg/dL — ABNORMAL HIGH (ref 65–99)
Potassium: 4.6 mmol/L (ref 3.5–5.2)
Sodium: 139 mmol/L (ref 134–144)

## 2019-11-29 LAB — CBC
Hematocrit: 40.9 % (ref 34.0–46.6)
Hemoglobin: 12.9 g/dL (ref 11.1–15.9)
MCH: 27.9 pg (ref 26.6–33.0)
MCHC: 31.5 g/dL (ref 31.5–35.7)
MCV: 88 fL (ref 79–97)
Platelets: 263 10*3/uL (ref 150–450)
RBC: 4.63 x10E6/uL (ref 3.77–5.28)
RDW: 13.2 % (ref 11.7–15.4)
WBC: 6 10*3/uL (ref 3.4–10.8)

## 2019-11-29 LAB — LIPID PANEL
Chol/HDL Ratio: 2.9 ratio (ref 0.0–4.4)
Cholesterol, Total: 87 mg/dL — ABNORMAL LOW (ref 100–199)
HDL: 30 mg/dL — ABNORMAL LOW (ref >39)
LDL Chol Calc (NIH): 36 mg/dL (ref 0–99)
Triglycerides: 114 mg/dL (ref 0–149)
VLDL Cholesterol Cal: 21 mg/dL (ref 5–40)

## 2019-11-29 NOTE — Patient Instructions (Addendum)
After Visit Summary:  We will be checking the following labs today - BMET, CBC, HPF, Lipids   Medication Instructions:    Continue with your current medicines.    If you need a refill on your cardiac medications before your next appointment, please call your pharmacy.     Testing/Procedures To Be Arranged:  N/A  Follow-Up:   See me in 6 months.     At [facility], you and your health needs are our priority.  As part of our continuing mission to provide you with exceptional heart care, we have created designated Provider Care Teams.  These Care Teams include your primary Cardiologist (physician) and Advanced Practice Providers (APPs -  Physician Assistants and Nurse Practitioners) who all work together to provide you with the care you need, when you need it.  Special Instructions:  . Stay safe, wash your hands for at least 20 seconds and wear a mask when needed.  . It was good to talk with you today. Madaline Brilliant to try some over the counter Voltaren gel (Diclofenac)  . Ok to try [facility] if needed - over at [employer] . Try to keep up with the walking   Call the [place] office at [phone] if you have any questions, problems or concerns.

## 2019-12-12 ENCOUNTER — Other Ambulatory Visit: Payer: Self-pay | Admitting: Endocrinology

## 2020-01-11 ENCOUNTER — Ambulatory Visit
Admission: RE | Admit: 2020-01-11 | Discharge: 2020-01-11 | Disposition: A | Discharge status: Home / Self Care | Payer: Medicaid Other | Source: Ambulatory Visit | Attending: Cardiovascular Disease | Admitting: Cardiovascular Disease

## 2020-01-11 ENCOUNTER — Other Ambulatory Visit: Payer: Self-pay | Admitting: Cardiovascular Disease

## 2020-01-11 DIAGNOSIS — M545 Low back pain, unspecified: Secondary | ICD-10-CM

## 2020-01-11 DIAGNOSIS — M47816 Spondylosis without myelopathy or radiculopathy, lumbar region: Secondary | ICD-10-CM | POA: Diagnosis not present

## 2020-01-11 DIAGNOSIS — M25511 Pain in right shoulder: Secondary | ICD-10-CM

## 2020-01-11 DIAGNOSIS — M4319 Spondylolisthesis, multiple sites in spine: Secondary | ICD-10-CM | POA: Diagnosis not present

## 2020-01-11 DIAGNOSIS — M47817 Spondylosis without myelopathy or radiculopathy, lumbosacral region: Secondary | ICD-10-CM | POA: Diagnosis not present

## 2020-01-11 DIAGNOSIS — M19011 Primary osteoarthritis, right shoulder: Secondary | ICD-10-CM | POA: Diagnosis not present

## 2020-01-13 ENCOUNTER — Other Ambulatory Visit: Payer: Self-pay | Admitting: Endocrinology

## 2020-01-13 ENCOUNTER — Other Ambulatory Visit: Payer: Self-pay | Admitting: Nurse Practitioner

## 2020-01-30 ENCOUNTER — Other Ambulatory Visit (INDEPENDENT_AMBULATORY_CARE_PROVIDER_SITE_OTHER): Payer: Medicaid Other

## 2020-01-30 ENCOUNTER — Other Ambulatory Visit: Payer: Self-pay

## 2020-01-30 DIAGNOSIS — E039 Hypothyroidism, unspecified: Secondary | ICD-10-CM | POA: Diagnosis not present

## 2020-01-30 DIAGNOSIS — E1140 Type 2 diabetes mellitus with diabetic neuropathy, unspecified: Secondary | ICD-10-CM

## 2020-01-30 LAB — BASIC METABOLIC PANEL
BUN: 13 mg/dL (ref 6–23)
CO2: 28 mEq/L (ref 19–32)
Calcium: 9.9 mg/dL (ref 8.4–10.5)
Chloride: 104 mEq/L (ref 96–112)
Creatinine, Ser: 0.77 mg/dL (ref 0.40–1.20)
GFR: 75.06 mL/min (ref >60.00)
Glucose, Bld: 130 mg/dL — ABNORMAL HIGH (ref 70–99)
Potassium: 4.9 mEq/L (ref 3.5–5.1)
Sodium: 140 mEq/L (ref 135–145)

## 2020-01-30 LAB — HEMOGLOBIN A1C: Hgb A1c MFr Bld: 6.9 % — ABNORMAL HIGH (ref 4.6–6.5)

## 2020-01-30 LAB — TSH: TSH: 3.3 u[IU]/mL (ref 0.35–4.50)

## 2020-02-03 ENCOUNTER — Encounter: Payer: Self-pay | Admitting: Endocrinology

## 2020-02-03 ENCOUNTER — Ambulatory Visit (INDEPENDENT_AMBULATORY_CARE_PROVIDER_SITE_OTHER): Payer: Medicaid Other | Admitting: Endocrinology

## 2020-02-03 ENCOUNTER — Other Ambulatory Visit: Payer: Self-pay

## 2020-02-03 VITALS — BP 110/70 | HR 66 | Ht 62.0 in | Wt 131.0 lb

## 2020-02-03 DIAGNOSIS — E1140 Type 2 diabetes mellitus with diabetic neuropathy, unspecified: Secondary | ICD-10-CM

## 2020-02-03 DIAGNOSIS — E782 Mixed hyperlipidemia: Secondary | ICD-10-CM

## 2020-02-03 DIAGNOSIS — I10 Essential (primary) hypertension: Secondary | ICD-10-CM

## 2020-02-03 DIAGNOSIS — E039 Hypothyroidism, unspecified: Secondary | ICD-10-CM

## 2020-02-03 NOTE — Progress Notes (Signed)
Patient ID: [name] [name], female   DOB: 1956-03-17, 64 y.o.   MRN: [id number]           Reason for Appointment: Follow-up    History of Present Illness:          Diagnosis: Type 2 diabetes mellitus, date of diagnosis: ?  2014   Past history:   Her son thinks that she may have had diabetes diagnosed when she was in [place] When she moved to [facility] in 2014 she was not on any medications She was however found to have significant hyperglycemia with glucose 364 and A1c of 11.7 when she was admitted for her coronary artery disease in 04/2013. At that time she was started on premixed insulin twice a day Her level of control had not been adequate with persistently high A1c readings She did not tolerate Victoza well and was having decreased appetite, malaise and nausea and this was stopped subsequently  Recent history:        Oral hypoglycemic drugs:     Metformin ER 500 mg 4 tablets daily, Invokana 100 mg daily, Precose 25 mg before meals   Her A1c has been stable,  7%   Current blood sugar patterns and problems:  She is taking Metformin 2000 mg a day without any difficulty  However she still has a generally decreased appetite but she is able to eat 3 meals and keep her weight constant  She is only able to do some walking indoors and does not do any formal exercise  She checks her blood sugars consistently, on an average about twice a day at various times  No recent yeast infections with Invokana   Blood sugars are near normal throughout the day with mild increase above 100 fasting  Highest blood sugars on an average are after dinner but still averaging below 140  Does not complain of any nausea, diarrhea or gaseousness with her current regimen including acarbose   Side effects from medications have been: None  Self-care: The diet that the patient has been following is: None, has vegetarian diet Usually having vegetables and roti with lunch and dinner and sometimes  having lentils.   She does not like yogurt as it apparently causes heartburn  Usually not eating fried food                  Dietician visit, none   Glucose monitoring:  done usually 2 times a day       Glucometer: Accu-Chek  Blood Glucose readings by meter download and average readings as follows:   PRE-MEAL Fasting Lunch Dinner Bedtime Overall  Glucose range:  100-144      Mean/median:  115    122   POST-MEAL PC Breakfast PC Lunch PC Dinner  Glucose range:   94-150  103-180  Mean/median:  118  124  128  Previous data:  PRE-MEAL Fasting Lunch Dinner  overnight Overall  Glucose range:     79-175  Mean/median:  104  128  125  141  124   POST-MEAL PC Breakfast PC Lunch PC Dinner  Glucose range:     Mean/median:    123      Weight history:   Wt Readings from Last 3 Encounters:  02/03/20 131 lb (59.4 kg)  11/29/19 130 lb 12.8 oz (59.3 kg)  09/29/19 133 lb (60.3 kg)    Glycemic control:   Lab Results  Component Value Date   HGBA1C 6.9 (H) 01/30/2020   HGBA1C 7.0 (  H) 09/26/2019   HGBA1C 7.0 (H) 06/20/2019   Lab Results  Component Value Date   MICROALBUR 1.5 06/20/2019   LDLCALC 36 11/29/2019   CREATININE 0.77 01/30/2020    Lab on 01/30/2020  Component Date Value Ref Range Status  . TSH 01/30/2020 3.30  0.35 - 4.50 uIU/mL Final  . Sodium 01/30/2020 140  135 - 145 mEq/L Final  . Potassium 01/30/2020 4.9  3.5 - 5.1 mEq/L Final  . Chloride 01/30/2020 104  96 - 112 mEq/L Final  . CO2 01/30/2020 28  19 - 32 mEq/L Final  . Glucose, Bld 01/30/2020 130* 70 - 99 mg/dL Final  . BUN 01/30/2020 13  6 - 23 mg/dL Final  . Creatinine, Ser 01/30/2020 0.77  0.40 - 1.20 mg/dL Final  . GFR 01/30/2020 75.06  >60.00 mL/min Final  . Calcium 01/30/2020 9.9  8.4 - 10.5 mg/dL Final  . Hgb A1c MFr Bld 01/30/2020 6.9* 4.6 - 6.5 % Final   Glycemic Control Guidelines for People with Diabetes:Non Diabetic:  <6%Goal of Therapy: <7%Additional Action Suggested:  >8%       Allergies  as of 02/03/2020   No Known Allergies     Medication List       Accurate as of February 03, 2020 11:59 PM. If you have any questions, ask your nurse or doctor.        acarbose 25 MG tablet Commonly known as: PRECOSE TAKE 1 TABLET BY MOUTH AT BREAKFAST THEN TAKE 2 TABLETS BY MOUTH AT LUNCH AND 1 TABLET BY MOUTH AT DINNER   Accu-Chek FastClix Lancets Misc USE TO CHECK BLOOD SUGAR 3 TIMES PER DAY   Accu-Chek SmartView test strip Generic drug: glucose blood USE THREE TIMES PER DAY TO CHECK BLOOD SUGAR   aspirin EC 81 MG tablet Take 1 tablet (81 mg total) by mouth daily.   atorvastatin 80 MG tablet Commonly known as: LIPITOR TAKE 1 TABLET (80 MG TOTAL) BY MOUTH DAILY AT 6 PM.   gabapentin 100 MG capsule Commonly known as: NEURONTIN TAKE 2 CAPSULES BY MOUTH AT BEDTIME.   Invokana 100 MG Tabs tablet Generic drug: canagliflozin TAKE 1 TABLET (100 MG TOTAL) BY MOUTH DAILY.   levothyroxine 25 MCG tablet Commonly known as: SYNTHROID TAKE 1 TABLET (25 MCG TOTAL) BY MOUTH DAILY BEFORE BREAKFAST.   lisinopril 20 MG tablet Commonly known as: ZESTRIL TAKE ONE TABLET BY MOUTH ONCE DAILY   metFORMIN 500 MG 24 hr tablet Commonly known as: GLUCOPHAGE-XR TAKE 4 TABLETS BY MOUTH DAILY WITH SUPPER   metoprolol tartrate 25 MG tablet Commonly known as: LOPRESSOR TAKE ONE-HALF TABLET (1/2) BY MOUTH 2 (TWO) TIMES DAILY   nitroGLYCERIN 0.4 MG SL tablet Commonly known as: NITROSTAT Place 1 tablet (0.4 mg total) under the tongue every 5 (five) minutes as needed for chest pain.   pantoprazole 40 MG tablet Commonly known as: PROTONIX TAKE 1 TABLET (40 MG TOTAL) BY MOUTH DAILY.       Allergies: No Known Allergies  Past Medical History:  Diagnosis Date  . Anemia   . CAD (coronary artery disease)    a. 04/2013 Inf STEMI/PCI: LM nl, LAD min irregs, D1/2/3 nl, LCX 24m (2.5x18 Xience DES), OM1/2/3 nl, RCA nl, PDA nl, RPAV nl, RPL nl, EF 55%;  b. 04/2013 Echo: EF 55-60%, no rwma, mild MR,  mildly dil LA.  . Diabetes mellitus type 2, uncontrolled ([place])    a. 04/2013 HbA1c = 11.7.  . Hyperlipidemia   . Hypertension   .  Hypothyroidism     Past Surgical History:  Procedure Laterality Date  . CESAREAN SECTION    . LEFT HEART CATH  05-15-13  . LEFT HEART CATHETERIZATION WITH CORONARY ANGIOGRAM N/A 05/15/2013   Procedure: LEFT HEART CATHETERIZATION WITH CORONARY ANGIOGRAM;  Surgeon: Wellington Hampshire, MD;  Location: [facility];  Service: Cardiovascular;  Laterality: N/A;  . PERCUTANEOUS STENT INTERVENTION  05/15/2013   Procedure: PERCUTANEOUS STENT INTERVENTION;  Surgeon: Wellington Hampshire, MD;  Location: [facility];  Service: Cardiovascular;;    Family History  Problem Relation Age of Onset  . Heart disease Mother   . Diabetes Mother   . Hypertension Mother   . Heart disease Father   . Diabetes Father   . Hypertension Father   . Diabetes Brother     Social History:  reports that she has never smoked. She has never used smokeless tobacco. She reports that she does not drink alcohol and does not use drugs.    Review of Systems   HYPOTHYROIDISM: Screening TSH had been done because of her complaints of carpal tunnel syndrome and some fatigue Because of her high TSH she has been on levothyroxine 25 mcg daily No recent fatigue  She is taking this before breakfast in the morning daily  TSH is consistently normal  No history of goiter  Last TSH:  Lab Results  Component Value Date   TSH 3.30 01/30/2020   TSH 3.71 09/26/2019   TSH 3.18 06/20/2019   FREET4 0.96 06/20/2019   FREET4 0.68 09/03/2017     NEUROPATHY: She has  symptoms of numbness and some tingling in her hands and usually when waking up Has only minimal pains in her feet or burning  Symptoms are improved with gabapentin  Lipids: She is treated with Lipitor 80 mg for several years Lipids are well controlled, has history of CAD followed by cardiologist      Lab Results  Component Value Date    CHOL 87 (L) 11/29/2019   HDL 30 (L) 11/29/2019   LDLCALC 36 11/29/2019   TRIG 114 11/29/2019   CHOLHDL 2.9 11/29/2019                 HYPERTENSION   The blood pressure has been controlled with lisinopril 20 mg  Has a blood pressure meter from [employer] for home monitoring, not checking recently She is also on Invokana No lightheadedness, also recently followed by cardiology nurse practitioner with low changes recommended despite low normal readings    BP Readings from Last 3 Encounters:  02/03/20 110/70  11/29/19 106/70  09/29/19 110/70   She had a screening B12 level done because of neuropathic symptoms and long-term Metformin Had a low level at baseline and has been supplemented  Lab Results  Component Value Date   VITAMINB12 >1500 (H) 09/26/2019    No history of anemia  Lab Results  Component Value Date   HGB 12.9 11/29/2019    She had the J&J Covid vaccine in April 21  LABS:  Lab on 01/30/2020  Component Date Value Ref Range Status  . TSH 01/30/2020 3.30  0.35 - 4.50 uIU/mL Final  . Sodium 01/30/2020 140  135 - 145 mEq/L Final  . Potassium 01/30/2020 4.9  3.5 - 5.1 mEq/L Final  . Chloride 01/30/2020 104  96 - 112 mEq/L Final  . CO2 01/30/2020 28  19 - 32 mEq/L Final  . Glucose, Bld 01/30/2020 130* 70 - 99 mg/dL Final  . BUN 01/30/2020 13  6 - 23 mg/dL Final  . Creatinine, Ser 01/30/2020 0.77  0.40 - 1.20 mg/dL Final  . GFR 01/30/2020 75.06  >60.00 mL/min Final  . Calcium 01/30/2020 9.9  8.4 - 10.5 mg/dL Final  . Hgb A1c MFr Bld 01/30/2020 6.9* 4.6 - 6.5 % Final   Glycemic Control Guidelines for People with Diabetes:Non Diabetic:  <6%Goal of Therapy: <7%Additional Action Suggested:  >8%     Physical Examination:  BP 110/70 (BP Location: Left Arm, Patient Position: Sitting, Cuff Size: Normal)   Pulse 66   Ht 5\' 2"  (1.575 m)   Wt 131 lb (59.4 kg)   SpO2 96%   BMI 23.96 kg/m     ASSESSMENT:  Diabetes type 2, non-insulin requiring  See history of  present illness for discussion of current diabetes management, blood sugar patterns and problems identified  A1c is excellent at 6.9 and stable  She is on Invokana, metformin 2 g daily and Precose  She has near normal blood sugars throughout the day, averaging under 140 after meals also and highest blood sugar 180 Also fasting blood sugars are fairly good She seems to be tolerating maximum dose Metformin along with Invokana without any adverse reaction Weight is stable  Hypothyroidism: Mild; TSH is normal again on 25 mcg levothyroxine daily   HYPERTENSION: Blood pressure is low normal on lisinopril 20 mg prescribed by PCP recently, no change in renal function or orthostatic dizziness Microalbumin has been normal  PLAN:   She can cut back on morning blood sugar monitoring as she has little variability in in the morning Recommend no change in medication regimen To check readings about 2 hours after meals periodically Encourage her to walk at least 15 to 20 minutes daily  Periodically check blood pressure at home, consider reducing lisinopril especially if she has any change in renal function Continue 25 mcg levothyroxine  Will schedule appointment with ophthalmologist as she is overdue for exam, most recent exam was about 2 years ago in [place] and she has some nonspecific blurred vision and excessive lacrimation  Follow-up in 4 months   There are no Patient Instructions on file for this visit.  [name] [name] 02/04/2020, 11:20 AM    Note: This office note was prepared with Dragon voice recognition system technology. Any transcriptional errors that result from this process are unintentional.

## 2020-02-10 ENCOUNTER — Other Ambulatory Visit: Payer: Self-pay | Admitting: Endocrinology

## 2020-03-17 ENCOUNTER — Encounter (HOSPITAL_COMMUNITY): Payer: Self-pay | Admitting: *Deleted

## 2020-03-17 ENCOUNTER — Ambulatory Visit (INDEPENDENT_AMBULATORY_CARE_PROVIDER_SITE_OTHER): Payer: Medicaid Other

## 2020-03-17 ENCOUNTER — Ambulatory Visit (HOSPITAL_COMMUNITY)
Admission: EM | Admit: 2020-03-17 | Discharge: 2020-03-17 | Disposition: A | Discharge status: Home / Self Care | Payer: Medicaid Other | Attending: Emergency Medicine | Admitting: Emergency Medicine

## 2020-03-17 ENCOUNTER — Other Ambulatory Visit: Payer: Self-pay

## 2020-03-17 DIAGNOSIS — J189 Pneumonia, unspecified organism: Secondary | ICD-10-CM

## 2020-03-17 DIAGNOSIS — R0602 Shortness of breath: Secondary | ICD-10-CM

## 2020-03-17 DIAGNOSIS — J1282 Pneumonia due to coronavirus disease 2019: Secondary | ICD-10-CM | POA: Diagnosis not present

## 2020-03-17 DIAGNOSIS — E039 Hypothyroidism, unspecified: Secondary | ICD-10-CM | POA: Diagnosis not present

## 2020-03-17 DIAGNOSIS — U071 COVID-19: Secondary | ICD-10-CM | POA: Diagnosis not present

## 2020-03-17 DIAGNOSIS — I10 Essential (primary) hypertension: Secondary | ICD-10-CM | POA: Insufficient documentation

## 2020-03-17 DIAGNOSIS — R059 Cough, unspecified: Secondary | ICD-10-CM | POA: Diagnosis not present

## 2020-03-17 DIAGNOSIS — Z955 Presence of coronary angioplasty implant and graft: Secondary | ICD-10-CM | POA: Insufficient documentation

## 2020-03-17 DIAGNOSIS — I25110 Atherosclerotic heart disease of native coronary artery with unstable angina pectoris: Secondary | ICD-10-CM | POA: Insufficient documentation

## 2020-03-17 DIAGNOSIS — Z7982 Long term (current) use of aspirin: Secondary | ICD-10-CM | POA: Insufficient documentation

## 2020-03-17 DIAGNOSIS — R5383 Other fatigue: Secondary | ICD-10-CM

## 2020-03-17 DIAGNOSIS — I252 Old myocardial infarction: Secondary | ICD-10-CM | POA: Diagnosis not present

## 2020-03-17 DIAGNOSIS — E785 Hyperlipidemia, unspecified: Secondary | ICD-10-CM | POA: Insufficient documentation

## 2020-03-17 DIAGNOSIS — Z7984 Long term (current) use of oral hypoglycemic drugs: Secondary | ICD-10-CM | POA: Diagnosis not present

## 2020-03-17 DIAGNOSIS — Z7901 Long term (current) use of anticoagulants: Secondary | ICD-10-CM | POA: Insufficient documentation

## 2020-03-17 DIAGNOSIS — Z79899 Other long term (current) drug therapy: Secondary | ICD-10-CM | POA: Diagnosis not present

## 2020-03-17 DIAGNOSIS — K219 Gastro-esophageal reflux disease without esophagitis: Secondary | ICD-10-CM | POA: Insufficient documentation

## 2020-03-17 DIAGNOSIS — R1110 Vomiting, unspecified: Secondary | ICD-10-CM

## 2020-03-17 DIAGNOSIS — E119 Type 2 diabetes mellitus without complications: Secondary | ICD-10-CM | POA: Diagnosis not present

## 2020-03-17 HISTORY — DX: Gastro-esophageal reflux disease without esophagitis: K21.9

## 2020-03-17 LAB — SARS CORONAVIRUS 2 (TAT 6-24 HRS): SARS Coronavirus 2: POSITIVE — AB

## 2020-03-17 MED ORDER — DM-GUAIFENESIN ER 30-600 MG PO TB12
1.0000 | ORAL_TABLET | Freq: Two times a day (BID) | ORAL | 0 refills | Status: DC
Start: 2020-03-17 — End: 2020-05-29

## 2020-03-17 MED ORDER — BENZONATATE 200 MG PO CAPS
200.0000 mg | ORAL_CAPSULE | Freq: Three times a day (TID) | ORAL | 0 refills | Status: AC | PRN
Start: 2020-03-17 — End: 2020-03-24

## 2020-03-17 MED ORDER — DOXYCYCLINE HYCLATE 100 MG PO CAPS
100.0000 mg | ORAL_CAPSULE | Freq: Two times a day (BID) | ORAL | 0 refills | Status: DC
Start: 2020-03-17 — End: 2020-05-29

## 2020-03-17 MED ORDER — ONDANSETRON 4 MG PO TBDP
4.0000 mg | ORAL_TABLET | Freq: Three times a day (TID) | ORAL | 0 refills | Status: AC | PRN
Start: 2020-03-17 — End: ?

## 2020-03-17 NOTE — Discharge Instructions (Signed)
Covid test pending, monitor my chart for results Begin doxycycline twice daily to cover atypical pneumonia seen on chest x-ray Tessalon/benzonatate every 8 hours for cough Mucinex DM twice daily for cough/congestion Rest and fluids Tylenol and ibuprofen as needed for body aches, fevers Follow-up if not improving or worsening

## 2020-03-17 NOTE — ED Triage Notes (Signed)
Per son, pt started with fever, dry throat, cough, fatigue 3 days ago.  C/O feeling SOB, especially after ambulating to exam room.  Had one episode vomiting few days ago.  Reports tolerating PO fluids well.

## 2020-03-17 NOTE — ED Provider Notes (Signed)
[place]    CSN: [id number] Arrival date & time: 03/17/20  1120      History   Chief Complaint Chief Complaint  Patient presents with   Cough   Shortness of Breath    HPI [name] [name] is a 64 y.o. female history of CAD, DM type II, hypertension, hyperlipidemia presenting today for evaluation of URI symptoms and shortness of breath.  Patient developed some mild URI symptoms last weekend, approximately 1 week ago.  Earlier this week developed subjective fevers.  Pain complaint has been sore dry throat, cough and fatigue.  She has had decreased appetite.  One isolated episode of vomiting yesterday, but no persistent GI symptoms.  Has had increased shortness of breath with any exertion, son has also noticed her becoming winded very easily.  Denies chest pain.  Denies close sick contacts.  Denies history of underlying asthma, COPD or tobacco use.  HPI  Past Medical History:  Diagnosis Date   Anemia    CAD (coronary artery disease)    a. 04/2013 Inf STEMI/PCI: LM nl, LAD min irregs, D1/2/3 nl, LCX 53m (2.5x18 Xience DES), OM1/2/3 nl, RCA nl, PDA nl, RPAV nl, RPL nl, EF 55%;  b. 04/2013 Echo: EF 55-60%, no rwma, mild MR, mildly dil LA.   Diabetes mellitus type 2, uncontrolled ([place])    a. 04/2013 HbA1c = 11.7.   GERD (gastroesophageal reflux disease)    Hyperlipidemia    Hypertension    Hypothyroidism     Patient Active Problem List   Diagnosis Date Noted   DM (diabetes mellitus) ([place]) 11/16/2013   ST elevation myocardial infarction (STEMI) of inferior wall, initial episode of care [facility]) 05/17/2013   CAD (coronary artery disease) 05/17/2013   Diabetes mellitus type 2, uncontrolled ([place]) 05/17/2013   Hyperlipidemia 05/17/2013   STEMI (ST elevation myocardial infarction) ([place]) 05/16/2013   Chest pain 05/14/2013   Diabetes mellitus ([place]) 05/14/2013   HTN (hypertension) 05/14/2013   Unstable angina ([place]) 05/14/2013    Past Surgical History:    Procedure Laterality Date   CESAREAN SECTION     LEFT HEART CATH  05-15-13   LEFT HEART CATHETERIZATION WITH CORONARY ANGIOGRAM N/A 05/15/2013   Procedure: LEFT HEART CATHETERIZATION WITH CORONARY ANGIOGRAM;  Surgeon: Wellington Hampshire, MD;  Location: [place] [facility] CATH LAB;  Service: Cardiovascular;  Laterality: N/A;   PERCUTANEOUS STENT INTERVENTION  05/15/2013   Procedure: PERCUTANEOUS STENT INTERVENTION;  Surgeon: Wellington Hampshire, MD;  Location: [place] CATH LAB;  Service: Cardiovascular;;    OB History   No obstetric history on file.      Home Medications    Prior to Admission medications   Medication Sig Start Date End Date Taking? Authorizing Provider  aspirin EC 81 MG tablet Take 1 tablet (81 mg total) by mouth daily. 05/24/13  Yes Weaver, Scott T, PA-C  atorvastatin (LIPITOR) 80 MG tablet TAKE 1 TABLET (80 MG TOTAL) BY MOUTH DAILY AT 6 PM. 05/16/19  Yes Burtis Junes, NP  gabapentin (NEURONTIN) 100 MG capsule TAKE 2 CAPSULES BY MOUTH AT BEDTIME. 01/15/20  Yes Elayne Snare, MD  INVOKANA 100 MG TABS tablet TAKE 1 TABLET (100 MG TOTAL) BY MOUTH DAILY. 12/12/19  Yes Elayne Snare, MD  levothyroxine (SYNTHROID) 25 MCG tablet TAKE 1 TABLET (25 MCG TOTAL) BY MOUTH DAILY BEFORE BREAKFAST. 05/15/19  Yes Elayne Snare, MD  lisinopril (ZESTRIL) 20 MG tablet TAKE ONE TABLET BY MOUTH ONCE DAILY 05/16/19  Yes Burtis Junes, NP  metFORMIN (GLUCOPHAGE-XR) 500 MG  24 hr tablet TAKE 4 TABLETS BY MOUTH DAILY WITH SUPPER 10/07/19  Yes Elayne Snare, MD  metoprolol tartrate (LOPRESSOR) 25 MG tablet TAKE ONE-HALF TABLET (1/2) BY MOUTH 2 (TWO) TIMES DAILY 05/16/19  Yes Burtis Junes, NP  pantoprazole (PROTONIX) 40 MG tablet TAKE 1 TABLET (40 MG TOTAL) BY MOUTH DAILY. 01/16/20  Yes Burtis Junes, NP  acarbose (PRECOSE) 25 MG tablet TAKE 1 TABLET BY MOUTH AT BREAKFAST THEN TAKE 2 TABLETS BY MOUTH AT LUNCH AND 1 TABLET BY MOUTH AT Wonda Cheng 02/10/20   Elayne Snare, MD  Accu-Chek FastClix Lancets MISC USE TO CHECK BLOOD  SUGAR 3 TIMES PER DAY 12/12/19   Elayne Snare, MD  ACCU-CHEK SMARTVIEW test strip USE THREE TIMES PER DAY TO CHECK BLOOD SUGAR 01/15/20   Elayne Snare, MD  benzonatate (TESSALON) 200 MG capsule Take 1 capsule (200 mg total) by mouth 3 (three) times daily as needed for up to 7 days for cough. 03/17/20 03/24/20  Tyrez Berrios C, PA-C  dextromethorphan-guaiFENesin (MUCINEX DM) 30-600 MG 12hr tablet Take 1 tablet by mouth 2 (two) times daily. 03/17/20   Annabelle Rexroad C, PA-C  doxycycline (VIBRAMYCIN) 100 MG capsule Take 1 capsule (100 mg total) by mouth 2 (two) times daily. 03/17/20   Terris Bodin C, PA-C  nitroGLYCERIN (NITROSTAT) 0.4 MG SL tablet Place 1 tablet (0.4 mg total) under the tongue every 5 (five) minutes as needed for chest pain. 05/24/19   Burtis Junes, NP  ondansetron (ZOFRAN ODT) 4 MG disintegrating tablet Take 1 tablet (4 mg total) by mouth every 8 (eight) hours as needed for nausea or vomiting. 03/17/20   Baneen Wieseler, Elesa Hacker, PA-C    Family History Family History  Problem Relation Age of Onset   Heart disease Mother    Diabetes Mother    Hypertension Mother    Heart disease Father    Diabetes Father    Hypertension Father    Diabetes Brother     Social History Social History   Tobacco Use   Smoking status: Never Smoker   Smokeless tobacco: Never Used  Scientific laboratory technician Use: Never used  Substance Use Topics   Alcohol use: No   Drug use: No     Allergies   Patient has no known allergies.   Review of Systems Review of Systems  Constitutional: Positive for appetite change, fatigue and fever. Negative for activity change and chills.  HENT: Positive for congestion, rhinorrhea and sore throat. Negative for ear pain, sinus pressure and trouble swallowing.   Eyes: Negative for discharge and redness.  Respiratory: Positive for cough and shortness of breath. Negative for chest tightness.   Cardiovascular: Negative for chest pain.  Gastrointestinal:  Negative for abdominal pain, diarrhea, nausea and vomiting.  Musculoskeletal: Negative for myalgias.  Skin: Negative for rash.  Neurological: Negative for dizziness, light-headedness and headaches.     Physical Exam Triage Vital Signs ED Triage Vitals  Enc Vitals Group     BP 03/17/20 1304 129/84     Pulse Rate 03/17/20 1304 97     Resp 03/17/20 1304 (!) 32     Temp 03/17/20 1304 99.3 F (37.4 C)     Temp Source 03/17/20 1304 Temporal     SpO2 03/17/20 1304 95 %     Weight --      Height --      Head Circumference --      Peak Flow --      Pain Score 03/17/20  1305 2     Pain Loc --      Pain Edu? --      Excl. in [place]? --    No data found.  Updated Vital Signs BP 129/84    Pulse 97    Temp 99.3 F (37.4 C) (Temporal)    Resp (!) 32    SpO2 95%   Visual Acuity Right Eye Distance:   Left Eye Distance:   Bilateral Distance:    Right Eye Near:   Left Eye Near:    Bilateral Near:     Physical Exam Vitals and nursing note reviewed.  Constitutional:      Appearance: She is well-developed.     Comments: No acute distress  HENT:     Head: Normocephalic and atraumatic.     Ears:     Comments: Bilateral ears without tenderness to palpation of external auricle, tragus and mastoid, EAC's without erythema or swelling, TM's with good bony landmarks and cone of light. Non erythematous.     Nose: Nose normal.     Mouth/Throat:     Comments: Oral mucosa pink and moist, no tonsillar enlargement or exudate. Posterior pharynx patent and nonerythematous, no uvula deviation or swelling. Normal phonation. Eyes:     Conjunctiva/sclera: Conjunctivae normal.  Cardiovascular:     Rate and Rhythm: Normal rate and regular rhythm.  Pulmonary:     Effort: Pulmonary effort is normal. No respiratory distress.     Comments: Breathing comfortably at rest, CTABL, no wheezing, rales or other adventitious sounds auscultated Abdominal:     General: There is no distension.  Musculoskeletal:          General: Normal range of motion.     Cervical back: Neck supple.  Skin:    General: Skin is warm and dry.  Neurological:     Mental Status: She is alert and oriented to person, place, and time.      UC Treatments / Results  Labs (all labs ordered are listed, but only abnormal results are displayed) Labs Reviewed  SARS CORONAVIRUS 2 (TAT 6-24 HRS)    EKG   Radiology DG Chest 2 View  Result Date: 03/17/2020 CLINICAL DATA:  Per son, pt started with fever, dry throat, cough, fatigue 3 days ago. C/O feeling SOB, especially after ambulating to exam room. Had one episode vomiting few days ago. Reports tolerating PO fluids well. EXAM: CHEST - 2 VIEW COMPARISON:  06/04/2013 FINDINGS: Ill-defined patchy peripheral airspace opacities bilaterally in the lung bases. No overt interstitial edema. Heart size and mediastinal contours are within normal limits. Aortic Atherosclerosis (ICD10-170.0). No effusion.  No pneumothorax. Visualized bones unremarkable. IMPRESSION: Patchy peripheral bibasilar airspace opacities . Electronically Signed   By: Lucrezia Europe M.D.   On: 03/17/2020 13:51    Procedures Procedures (including critical care time)  Medications Ordered in UC Medications - No data to display  Initial Impression / Assessment and Plan / UC Course  I have reviewed the triage vital signs and the nursing notes.  Pertinent labs & imaging results that were available during my care of the patient were reviewed by me and considered in my medical decision making (see chart for details).     Atypical versus viral pneumonia noted on chest x-ray, O2 stable at 95% currently, placing on doxycycline, Covid test pending.  Tessalon and Mucinex for cough and congestion.  Patient to follow-up in emergency room if having worsening shortness of breath, lethargy dizziness or lightheadedness.  Rest and fluids.  Zofran as needed for nausea in order to increase oral intake.  Discussed strict return  precautions. Patient verbalized understanding and is agreeable with plan.  Final Clinical Impressions(s) / UC Diagnoses   Final diagnoses:  Cough  Shortness of breath  Atypical pneumonia     Discharge Instructions     Covid test pending, monitor my chart for results Begin doxycycline twice daily to cover atypical pneumonia seen on chest x-ray Tessalon/benzonatate every 8 hours for cough Mucinex DM twice daily for cough/congestion Rest and fluids Tylenol and ibuprofen as needed for body aches, fevers Follow-up if not improving or worsening    ED Prescriptions    Medication Sig Dispense Auth. Provider   doxycycline (VIBRAMYCIN) 100 MG capsule Take 1 capsule (100 mg total) by mouth 2 (two) times daily. 20 capsule Phala Schraeder C, PA-C   benzonatate (TESSALON) 200 MG capsule Take 1 capsule (200 mg total) by mouth 3 (three) times daily as needed for up to 7 days for cough. 28 capsule Daisee Centner C, PA-C   dextromethorphan-guaiFENesin (MUCINEX DM) 30-600 MG 12hr tablet Take 1 tablet by mouth 2 (two) times daily. 20 tablet Lief Palmatier C, PA-C   ondansetron (ZOFRAN ODT) 4 MG disintegrating tablet Take 1 tablet (4 mg total) by mouth every 8 (eight) hours as needed for nausea or vomiting. 20 tablet Jaquari Reckner, [place] C, PA-C     PDMP not reviewed this encounter.   Janith Lima, PA-C 03/17/20 1406

## 2020-03-18 ENCOUNTER — Other Ambulatory Visit: Payer: Self-pay | Admitting: Physician Assistant

## 2020-03-18 ENCOUNTER — Telehealth: Payer: Self-pay | Admitting: Physician Assistant

## 2020-03-18 DIAGNOSIS — E1165 Type 2 diabetes mellitus with hyperglycemia: Secondary | ICD-10-CM

## 2020-03-18 DIAGNOSIS — U071 COVID-19: Secondary | ICD-10-CM

## 2020-03-18 DIAGNOSIS — I2510 Atherosclerotic heart disease of native coronary artery without angina pectoris: Secondary | ICD-10-CM

## 2020-03-18 DIAGNOSIS — I10 Essential (primary) hypertension: Secondary | ICD-10-CM

## 2020-03-18 NOTE — Telephone Encounter (Signed)
Called to Discuss with patient about Covid symptoms and the use of the monoclonal antibody infusion for those with mild to moderate Covid symptoms and at a high risk of hospitalization.     Pt appears to qualify for this infusion due to co-morbid conditions and/or a member of an at-risk group in accordance with the FDA Emergency Use Authorization.    Unable to reach pt. Pt requires [place] interpreter. VM was full. I did send a MyChart message.   Qualifies for age, CAD/HTN, DM, SVI   Courtney Paris 03/18/2020, 8:16 AM [place] [address] [place],  [address]

## 2020-03-18 NOTE — Progress Notes (Signed)
I connected by phone with [name] on 03/18/2020 at 1:50 PM to discuss the potential use of a new treatment for mild to moderate COVID-19 viral infection in non-hospitalized patients.  This patient is a 64 y.o. female that meets the FDA criteria for Emergency Use Authorization of COVID monoclonal antibody casirivimab/imdevimab or bamlanivimab/eteseviamb.  Has a (+) direct SARS-CoV-2 viral test result  Has mild or moderate COVID-19   Is NOT hospitalized due to COVID-19  Is within 10 days of symptom onset  Has at least one of the high risk factor(s) for progression to severe COVID-19 and/or hospitalization as defined in EUA.  Specific high risk criteria : Older age (>/= 66 yo), Diabetes and Cardiovascular disease or hypertension   I have spoken and communicated the following to the patient or parent/caregiver regarding COVID monoclonal antibody treatment:  1. FDA has authorized the emergency use for the treatment of mild to moderate COVID-19 in adults and pediatric patients with positive results of direct SARS-CoV-2 viral testing who are 54 years of age and older weighing at least 40 kg, and who are at high risk for progressing to severe COVID-19 and/or hospitalization.  2. The significant known and potential risks and benefits of COVID monoclonal antibody, and the extent to which such potential risks and benefits are unknown.  3. Information on available alternative treatments and the risks and benefits of those alternatives, including clinical trials.  4. Patients treated with COVID monoclonal antibody should continue to self-isolate and use infection control measures (e.g., wear mask, isolate, social distance, avoid sharing personal items, clean and disinfect "high touch" surfaces, and frequent handwashing) according to CDC guidelines.   5. The patient or parent/caregiver has the option to accept or refuse COVID monoclonal antibody treatment.  After reviewing this information with  the patient, the patient has agreed to receive one of the available covid 19 monoclonal antibodies and will be provided an appropriate fact sheet prior to infusion.   [place], [place] 03/18/2020 1:50 PM

## 2020-03-19 ENCOUNTER — Ambulatory Visit (HOSPITAL_COMMUNITY)
Admission: RE | Admit: 2020-03-19 | Discharge: 2020-03-19 | Disposition: A | Discharge status: Home / Self Care | Payer: Medicaid Other | Source: Ambulatory Visit | Attending: Pulmonary Disease | Admitting: Pulmonary Disease

## 2020-03-19 DIAGNOSIS — U071 COVID-19: Secondary | ICD-10-CM | POA: Diagnosis not present

## 2020-03-19 MED ORDER — EPINEPHRINE 0.3 MG/0.3ML IJ SOAJ: 0.3000 mg | Freq: Once | INTRAMUSCULAR | Status: DC | PRN

## 2020-03-19 MED ORDER — DIPHENHYDRAMINE HCL 50 MG/ML IJ SOLN: 50.0000 mg | Freq: Once | INTRAMUSCULAR | Status: DC | PRN

## 2020-03-19 MED ORDER — SODIUM CHLORIDE 0.9 % IV SOLN
1200.0000 mg | Freq: Once | INTRAVENOUS | Status: AC
  Administered 2020-03-19: 1200 mg via INTRAVENOUS

## 2020-03-19 MED ORDER — ALBUTEROL SULFATE HFA 108 (90 BASE) MCG/ACT IN AERS: 2.0000 | INHALATION_SPRAY | Freq: Once | RESPIRATORY_TRACT | Status: DC | PRN

## 2020-03-19 MED ORDER — FAMOTIDINE IN NACL 20-0.9 MG/50ML-% IV SOLN: 20.0000 mg | Freq: Once | INTRAVENOUS | Status: DC | PRN

## 2020-03-19 MED ORDER — METHYLPREDNISOLONE SODIUM SUCC 125 MG IJ SOLR: 125.0000 mg | Freq: Once | INTRAMUSCULAR | Status: DC | PRN

## 2020-03-19 MED ORDER — SODIUM CHLORIDE 0.9 % IV SOLN: INTRAVENOUS | Status: DC | PRN

## 2020-03-19 NOTE — Discharge Instructions (Signed)

## 2020-03-19 NOTE — Progress Notes (Signed)
Patient ID: [name] [name], female   DOB: 1955-08-20, 64 y.o.   MRN: [id number]   Diagnosis: EFEOF-12  Physician: Doylene Canard  Procedure: Covid Infusion Clinic Med: casirivimab\imdevimab infusion - Provided patient with casirivimab\imdevimab fact sheet for patients, parents and caregivers prior to infusion.  Complications: No immediate complications noted.  Discharge: Discharged home   [name] 03/19/2020

## 2020-03-21 ENCOUNTER — Ambulatory Visit (HOSPITAL_COMMUNITY)
Admission: EM | Admit: 2020-03-21 | Discharge: 2020-03-21 | Disposition: A | Discharge status: Home / Self Care | Payer: Medicaid Other | Attending: Family Medicine | Admitting: Family Medicine

## 2020-03-21 ENCOUNTER — Encounter (HOSPITAL_COMMUNITY): Payer: Self-pay | Admitting: Emergency Medicine

## 2020-03-21 ENCOUNTER — Other Ambulatory Visit: Payer: Self-pay

## 2020-03-21 ENCOUNTER — Ambulatory Visit (INDEPENDENT_AMBULATORY_CARE_PROVIDER_SITE_OTHER): Payer: Medicaid Other

## 2020-03-21 DIAGNOSIS — R0602 Shortness of breath: Secondary | ICD-10-CM | POA: Insufficient documentation

## 2020-03-21 DIAGNOSIS — U071 COVID-19: Secondary | ICD-10-CM | POA: Diagnosis not present

## 2020-03-21 LAB — COMPREHENSIVE METABOLIC PANEL
ALT: 17 U/L (ref 0–44)
AST: 22 U/L (ref 15–41)
Albumin: 3.4 g/dL — ABNORMAL LOW (ref 3.5–5.0)
Alkaline Phosphatase: 34 U/L — ABNORMAL LOW (ref 38–126)
Anion gap: 11 (ref 5–15)
BUN: 13 mg/dL (ref 8–23)
CO2: 25 mmol/L (ref 22–32)
Calcium: 9.8 mg/dL (ref 8.9–10.3)
Chloride: 99 mmol/L (ref 98–111)
Creatinine, Ser: 0.64 mg/dL (ref 0.44–1.00)
GFR calc non Af Amer: >60 mL/min (ref >60)
Glucose, Bld: 165 mg/dL — ABNORMAL HIGH (ref 70–99)
Potassium: 4.8 mmol/L (ref 3.5–5.1)
Sodium: 135 mmol/L (ref 135–145)
Total Bilirubin: 0.7 mg/dL (ref 0.3–1.2)
Total Protein: 7.3 g/dL (ref 6.5–8.1)

## 2020-03-21 LAB — CBC WITH DIFFERENTIAL/PLATELET
Abs Immature Granulocytes: 0.02 10*3/uL (ref 0.00–0.07)
Basophils Absolute: 0 10*3/uL (ref 0.0–0.1)
Basophils Relative: 0 %
Eosinophils Absolute: 0 10*3/uL (ref 0.0–0.5)
Eosinophils Relative: 1 %
HCT: 41.1 % (ref 36.0–46.0)
Hemoglobin: 13.4 g/dL (ref 12.0–15.0)
Immature Granulocytes: 0 %
Lymphocytes Relative: 26 %
Lymphs Abs: 1.3 10*3/uL (ref 0.7–4.0)
MCH: 27.3 pg (ref 26.0–34.0)
MCHC: 32.6 g/dL (ref 30.0–36.0)
MCV: 83.9 fL (ref 80.0–100.0)
Monocytes Absolute: 0.6 10*3/uL (ref 0.1–1.0)
Monocytes Relative: 12 %
Neutro Abs: 3.2 10*3/uL (ref 1.7–7.7)
Neutrophils Relative %: 61 %
Platelets: 400 10*3/uL (ref 150–400)
RBC: 4.9 MIL/uL (ref 3.87–5.11)
RDW: 12.7 % (ref 11.5–15.5)
WBC: 5.2 10*3/uL (ref 4.0–10.5)
nRBC: 0 % (ref 0.0–0.2)

## 2020-03-21 MED ORDER — PREDNISONE 10 MG PO TABS: 40.0000 mg | ORAL_TABLET | Freq: Every day | ORAL | 0 refills | Status: AC

## 2020-03-21 MED ORDER — ALBUTEROL SULFATE HFA 108 (90 BASE) MCG/ACT IN AERS: 1.0000 | INHALATION_SPRAY | Freq: Four times a day (QID) | RESPIRATORY_TRACT | 0 refills | Status: DC | PRN

## 2020-03-21 NOTE — ED Notes (Signed)
Assisted patient with dressing prior to discharge

## 2020-03-21 NOTE — ED Notes (Signed)
Notified dr hagler of patient and respiratory rate

## 2020-03-21 NOTE — Discharge Instructions (Addendum)
X ray was mildly worsened since a few days ago.  Oxygen levels are 100% today.  No concerns on the blood work EKG normal.  We can give inhaler to use as needed for cough, wheezing, SOB.  Prednisone daily x 5 days with food For worsening symptoms go to the ER

## 2020-03-21 NOTE — ED Provider Notes (Signed)
[place]    CSN: [id number] Arrival date & time: 03/21/20  1106      History   Chief Complaint Chief Complaint  Patient presents with  . Shortness of Breath    HPI [name] [name] is a 64 y.o. female.   Patient is a 64 year old female past medical history of anemia, CAD, STEMI, DM, GERD, hyperlipidemia, hypertension, hypothyroidism, unstable angina.  She presents today with worsening shortness of breath.  Patient was diagnosed with Covid on 03/17/2020.  She is also found the time to have Covid pneumonia.  She then had the antibody infusion on 03/19/2020.  Per her son she has had difficulty eating, doing pretty much any activity without becoming shortness of breath.  She is okay when sitting partly still.  Today her oxygen saturations are 100% but she is tachypnea.  No specific history of anemia but appears to be pale.      Past Medical History:  Diagnosis Date  . Anemia   . CAD (coronary artery disease)    a. 04/2013 Inf STEMI/PCI: LM nl, LAD min irregs, D1/2/3 nl, LCX 21m (2.5x18 Xience DES), OM1/2/3 nl, RCA nl, PDA nl, RPAV nl, RPL nl, EF 55%;  b. 04/2013 Echo: EF 55-60%, no rwma, mild MR, mildly dil LA.  . Diabetes mellitus type 2, uncontrolled ([place])    a. 04/2013 HbA1c = 11.7.  . GERD (gastroesophageal reflux disease)   . Hyperlipidemia   . Hypertension   . Hypothyroidism     Patient Active Problem List   Diagnosis Date Noted  . DM (diabetes mellitus) ([place]) 11/16/2013  . ST elevation myocardial infarction (STEMI) of inferior wall, initial episode of care ([place]) 05/17/2013  . CAD (coronary artery disease) 05/17/2013  . Diabetes mellitus type 2, uncontrolled ([place]) 05/17/2013  . Hyperlipidemia 05/17/2013  . STEMI (ST elevation myocardial infarction) ([place]) 05/16/2013  . Chest pain 05/14/2013  . Diabetes mellitus ([place]) 05/14/2013  . HTN (hypertension) 05/14/2013  . Unstable angina ([place]) 05/14/2013    Past Surgical History:  Procedure Laterality Date  .  CESAREAN SECTION    . LEFT HEART CATH  05-15-13  . LEFT HEART CATHETERIZATION WITH CORONARY ANGIOGRAM N/A 05/15/2013   Procedure: LEFT HEART CATHETERIZATION WITH CORONARY ANGIOGRAM;  Surgeon: Wellington Hampshire, MD;  Location: [facility] CATH [facility];  Service: Cardiovascular;  Laterality: N/A;  . PERCUTANEOUS STENT INTERVENTION  05/15/2013   Procedure: PERCUTANEOUS STENT INTERVENTION;  Surgeon: Wellington Hampshire, MD;  Location: [facility] CATH LAB;  Service: Cardiovascular;;    OB History   No obstetric history on file.      Home Medications    Prior to Admission medications   Medication Sig Start Date End Date Taking? Authorizing Provider  acarbose (PRECOSE) 25 MG tablet TAKE 1 TABLET BY MOUTH AT BREAKFAST THEN TAKE 2 TABLETS BY MOUTH AT LUNCH AND 1 TABLET BY MOUTH AT Wonda Cheng 02/10/20   Elayne Snare, MD  Accu-Chek FastClix Lancets MISC USE TO CHECK BLOOD SUGAR 3 TIMES PER DAY 12/12/19   Elayne Snare, MD  ACCU-CHEK SMARTVIEW test strip USE THREE TIMES PER DAY TO CHECK BLOOD SUGAR 01/15/20   Elayne Snare, MD  albuterol (VENTOLIN HFA) 108 (90 Base) MCG/ACT inhaler Inhale 1-2 puffs into the lungs every 6 (six) hours as needed for wheezing or shortness of breath. 03/21/20   Loura Halt A, NP  aspirin EC 81 MG tablet Take 1 tablet (81 mg total) by mouth daily. 05/24/13   Richardson Dopp T, PA-C  atorvastatin (LIPITOR) 80 MG tablet  TAKE 1 TABLET (80 MG TOTAL) BY MOUTH DAILY AT 6 PM. 05/16/19   Burtis Junes, NP  benzonatate (TESSALON) 200 MG capsule Take 1 capsule (200 mg total) by mouth 3 (three) times daily as needed for up to 7 days for cough. 03/17/20 03/24/20  Wieters, Hallie C, PA-C  dextromethorphan-guaiFENesin (MUCINEX DM) 30-600 MG 12hr tablet Take 1 tablet by mouth 2 (two) times daily. 03/17/20   Wieters, Hallie C, PA-C  doxycycline (VIBRAMYCIN) 100 MG capsule Take 1 capsule (100 mg total) by mouth 2 (two) times daily. 03/17/20   Wieters, Hallie C, PA-C  gabapentin (NEURONTIN) 100 MG capsule TAKE 2 CAPSULES BY MOUTH AT  BEDTIME. 01/15/20   Elayne Snare, MD  INVOKANA 100 MG TABS tablet TAKE 1 TABLET (100 MG TOTAL) BY MOUTH DAILY. 12/12/19   Elayne Snare, MD  levothyroxine (SYNTHROID) 25 MCG tablet TAKE 1 TABLET (25 MCG TOTAL) BY MOUTH DAILY BEFORE BREAKFAST. 05/15/19   Elayne Snare, MD  lisinopril (ZESTRIL) 20 MG tablet TAKE ONE TABLET BY MOUTH ONCE DAILY 05/16/19   Burtis Junes, NP  metFORMIN (GLUCOPHAGE-XR) 500 MG 24 hr tablet TAKE 4 TABLETS BY MOUTH DAILY WITH SUPPER 10/07/19   Elayne Snare, MD  metoprolol tartrate (LOPRESSOR) 25 MG tablet TAKE ONE-HALF TABLET (1/2) BY MOUTH 2 (TWO) TIMES DAILY 05/16/19   Burtis Junes, NP  nitroGLYCERIN (NITROSTAT) 0.4 MG SL tablet Place 1 tablet (0.4 mg total) under the tongue every 5 (five) minutes as needed for chest pain. 05/24/19   Burtis Junes, NP  ondansetron (ZOFRAN ODT) 4 MG disintegrating tablet Take 1 tablet (4 mg total) by mouth every 8 (eight) hours as needed for nausea or vomiting. 03/17/20   Wieters, Hallie C, PA-C  pantoprazole (PROTONIX) 40 MG tablet TAKE 1 TABLET (40 MG TOTAL) BY MOUTH DAILY. 01/16/20   Burtis Junes, NP  predniSONE (DELTASONE) 10 MG tablet Take 4 tablets (40 mg total) by mouth daily for 5 days. 03/21/20 03/26/20  Orvan July, NP    Family History Family History  Problem Relation Age of Onset  . Heart disease Mother   . Diabetes Mother   . Hypertension Mother   . Heart disease Father   . Diabetes Father   . Hypertension Father   . Diabetes Brother     Social History Social History   Tobacco Use  . Smoking status: Never Smoker  . Smokeless tobacco: Never Used  Vaping Use  . Vaping Use: Never used  Substance Use Topics  . Alcohol use: No  . Drug use: No     Allergies   Patient has no known allergies.   Review of Systems Review of Systems   Physical Exam Triage Vital Signs ED Triage Vitals  Enc Vitals Group     BP 03/21/20 1325 (!) 142/73     Pulse Rate 03/21/20 1325 67     Resp 03/21/20 1325 (!) 32     Temp  03/21/20 1325 98.9 F (37.2 C)     Temp Source 03/21/20 1325 Oral     SpO2 03/21/20 1325 100 %     Weight --      Height --      Head Circumference --      Peak Flow --      Pain Score 03/21/20 1322 0     Pain Loc --      Pain Edu? --      Excl. in [place]? --    No data found.  Updated Vital Signs BP (!) 142/73 (BP Location: Left Arm)   Pulse 67   Temp 98.9 F (37.2 C) (Oral)   Resp (!) 32   SpO2 100%   Visual Acuity Right Eye Distance:   Left Eye Distance:   Bilateral Distance:    Right Eye Near:   Left Eye Near:    Bilateral Near:     Physical Exam Constitutional:      General: She is not in acute distress.    Appearance: She is ill-appearing. She is not toxic-appearing.  HENT:     Head: Normocephalic and atraumatic.  Eyes:     Conjunctiva/sclera: Conjunctivae normal.  Cardiovascular:     Rate and Rhythm: Normal rate and regular rhythm.  Pulmonary:     Effort: Tachypnea present.     Breath sounds: Examination of the right-lower field reveals decreased breath sounds. Examination of the left-lower field reveals decreased breath sounds. Decreased breath sounds present.  Skin:    General: Skin is warm.     Coloration: Skin is pale.  Neurological:     Mental Status: She is alert.      UC Treatments / Results  Labs (all labs ordered are listed, but only abnormal results are displayed) Labs Reviewed  COMPREHENSIVE METABOLIC PANEL - Abnormal; Notable for the following components:      Result Value   Glucose, Bld 165 (*)    Albumin 3.4 (*)    Alkaline Phosphatase 34 (*)    All other components within normal limits  CBC WITH DIFFERENTIAL/PLATELET    EKG   Radiology DG Chest 2 View  Result Date: 03/21/2020 CLINICAL DATA:  Shortness of breath.  COVID-19 positive. EXAM: CHEST - 2 VIEW COMPARISON:  03/17/2020.  06/04/2013. FINDINGS: Midline trachea. Normal heart size. Atherosclerosis in the transverse aorta. No pleural effusion or pneumothorax. Minimal  increase in basilar and peripheral predominant interstitial opacities bilaterally. IMPRESSION: Minimal increase in interstitial opacities which have a morphology and distribution most consistent with COVID-19 pneumonia. Electronically Signed   By: Abigail Miyamoto M.D.   On: 03/21/2020 13:51    Procedures Procedures (including critical care time)  Medications Ordered in UC Medications - No data to display  Initial Impression / Assessment and Plan / UC Course  I have reviewed the triage vital signs and the nursing notes.  Pertinent labs & imaging results that were available during my care of the patient were reviewed by me and considered in my medical decision making (see chart for details).     SOB Pt positive for covid and covid pneumonia She is not hypoxic today.  VSS X ray mildly worsened. Lab work unremarkable.  EKG with normal sinus rhythm and rate.  We will trial prednisone burst Inhaler as needed.  Rest  ER for worsening symptoms   Final Clinical Impressions(s) / UC Diagnoses   Final diagnoses:  SOB (shortness of breath)     Discharge Instructions     X ray was mildly worsened since a few days ago.  Oxygen levels are 100% today.  No concerns on the blood work EKG normal.  We can give inhaler to use as needed for cough, wheezing, SOB.  Prednisone daily x 5 days with food For worsening symptoms go to the ER      ED Prescriptions    Medication Sig Dispense Auth. Provider   albuterol (VENTOLIN HFA) 108 (90 Base) MCG/ACT inhaler Inhale 1-2 puffs into the lungs every 6 (six) hours as needed for wheezing or shortness  of breath. 1 each Navon Kotowski A, NP   predniSONE (DELTASONE) 10 MG tablet Take 4 tablets (40 mg total) by mouth daily for 5 days. 20 tablet Loura Halt A, NP     PDMP not reviewed this encounter.   Loura Halt A, NP 03/22/20 [phone]

## 2020-03-21 NOTE — ED Triage Notes (Addendum)
Patient has difficulty eating because of sob feeling in chest.  Sob with activity, breathing wakes her through the night.  Family reports lying down, breathing ok.  Not only activity, but position causes rapid breathing.  Sitting or standing causes rapid breathing.    Patient is covid positive, patient has had iv therapy.

## 2020-03-21 NOTE — ED Notes (Signed)
Patient placed in gown / given warm blankets

## 2020-04-11 ENCOUNTER — Other Ambulatory Visit: Payer: Self-pay | Admitting: Endocrinology

## 2020-04-11 ENCOUNTER — Other Ambulatory Visit: Payer: Self-pay | Admitting: Nurse Practitioner

## 2020-04-11 DIAGNOSIS — I10 Essential (primary) hypertension: Secondary | ICD-10-CM

## 2020-05-02 LAB — HM DIABETES EYE EXAM

## 2020-05-07 ENCOUNTER — Encounter: Payer: Self-pay | Admitting: *Deleted

## 2020-05-12 ENCOUNTER — Other Ambulatory Visit: Payer: Self-pay | Admitting: Endocrinology

## 2020-05-15 NOTE — Progress Notes (Signed)
CARDIOLOGY OFFICE NOTE  Date:  05/29/2020    [name] Date of Birth: 01-Mar-1956 Medical Record #[id number]  PCP:  Dixie Dials, MD  Cardiologist:  Marisa Cyphers  Chief Complaint  Patient presents with  . Follow-up    Seen for Dr. Marlou Porch    History of Present Illness: [name] [name] is a 64 y.o. female who presents today for a follow up visit. Former patient of Dr. Claris Gladden - to transition to Dr. Marlou Porch however has not been seen yet. Has primarily seen me.    She has a history of diabetes, HTN, and CAD s/p DES to [facility] in the setting of inferior STEMI back in 04/2013.  EF was preserved. Other PMH includes HTN, T2 DM, hypothyroidism, anemia and HLD. She has been found to have anemia - ended up referring to GI and found to be iron deficient. No longer on Plavix. Last seen in June by me. Not getting out much due to the pandemic - some fatigue and various aches and pain but cardiac status felt to be ok. Family felt like she was doing well. She has been vaccinated for COVID 19.    Comes in today. Here with her son who augments the history. She is doing ok. She did have COVID back in October and was pretty sick - they had been to a function in [place] - probable exposure was there. She did have antibody infusion. Getting better. Has had her 3rd COVID vaccine as well. No chest pain. Not short of breath. Coughing resolved. Not really walking but planning to get back to walking in the house. He is trying to keep her safe and inside. She has had recent labs. No problems with the medicines.   Past Medical History:  Diagnosis Date  . Anemia   . CAD (coronary artery disease)    a. 04/2013 Inf STEMI/PCI: LM nl, LAD min irregs, D1/2/3 nl, LCX 41m (2.5x18 Xience DES), OM1/2/3 nl, RCA nl, PDA nl, RPAV nl, RPL nl, EF 55%;  b. 04/2013 Echo: EF 55-60%, no rwma, mild MR, mildly dil LA.  . Diabetes mellitus type 2, uncontrolled ([place])    a. 04/2013 HbA1c = 11.7.  . GERD (gastroesophageal  reflux disease)   . Hyperlipidemia   . Hypertension   . Hypothyroidism     Past Surgical History:  Procedure Laterality Date  . CESAREAN SECTION    . LEFT HEART CATH  05-15-13  . LEFT HEART CATHETERIZATION WITH CORONARY ANGIOGRAM N/A 05/15/2013   Procedure: LEFT HEART CATHETERIZATION WITH CORONARY ANGIOGRAM;  Surgeon: Wellington Hampshire, MD;  Location: [facility] CATH LAB;  Service: Cardiovascular;  Laterality: N/A;  . PERCUTANEOUS STENT INTERVENTION  05/15/2013   Procedure: PERCUTANEOUS STENT INTERVENTION;  Surgeon: Wellington Hampshire, MD;  Location: [facility] CATH LAB;  Service: Cardiovascular;;     Medications: Current Meds  Medication Sig  . acarbose (PRECOSE) 25 MG tablet TAKE 1 TABLET BY MOUTH AT BREAKFAST THEN TAKE 2 TABLETS BY MOUTH AT LUNCH AND 1 TABLET BY MOUTH AT DINNER  . Accu-Chek FastClix Lancets MISC USE TO CHECK BLOOD SUGAR 3 TIMES PER DAY  . ACCU-CHEK SMARTVIEW test strip USE THREE TIMES PER DAY TO CHECK BLOOD SUGAR  . albuterol (VENTOLIN HFA) 108 (90 Base) MCG/ACT inhaler Inhale 1-2 puffs into the lungs every 6 (six) hours as needed for wheezing or shortness of breath.  Marland Kitchen aspirin EC 81 MG tablet Take 1 tablet (81 mg total) by mouth daily.  Marland Kitchen atorvastatin (LIPITOR) 80  MG tablet TAKE 1 TABLET (80 MG TOTAL) BY MOUTH DAILY AT 6 PM.  . gabapentin (NEURONTIN) 100 MG capsule TAKE 2 CAPSULES BY MOUTH AT BEDTIME.  Marland Kitchen INVOKANA 100 MG TABS tablet TAKE 1 TABLET (100 MG TOTAL) BY MOUTH DAILY.  Marland Kitchen levothyroxine (SYNTHROID) 25 MCG tablet TAKE 1 TABLET (25 MCG TOTAL) BY MOUTH DAILY BEFORE BREAKFAST.  Marland Kitchen lisinopril (ZESTRIL) 20 MG tablet TAKE ONE TABLET BY MOUTH ONCE DAILY  . metFORMIN (GLUCOPHAGE-XR) 500 MG 24 hr tablet TAKE 4 TABLETS BY MOUTH DAILY WITH SUPPER  . metoprolol tartrate (LOPRESSOR) 25 MG tablet TAKE ONE-HALF TABLET (1/2) BY MOUTH 2 (TWO) TIMES DAILY  . nitroGLYCERIN (NITROSTAT) 0.4 MG SL tablet Place 1 tablet (0.4 mg total) under the tongue every 5 (five) minutes as needed for chest pain.   Marland Kitchen ondansetron (ZOFRAN ODT) 4 MG disintegrating tablet Take 1 tablet (4 mg total) by mouth every 8 (eight) hours as needed for nausea or vomiting.  . pantoprazole (PROTONIX) 40 MG tablet TAKE 1 TABLET (40 MG TOTAL) BY MOUTH DAILY.     Allergies: No Known Allergies  Social History: The patient  reports that she has never smoked. She has never used smokeless tobacco. She reports that she does not drink alcohol and does not use drugs.   Family History: The patient's family history includes Diabetes in her brother, father, and mother; Heart disease in her father and mother; Hypertension in her father and mother.   Review of Systems: Please see the history of present illness.   All other systems are reviewed and negative.   Physical Exam: VS:  BP 120/70   Pulse 77   Ht 5\' 4"  (1.626 m)   Wt 130 lb 6.4 oz (59.1 kg)   SpO2 97%   BMI 22.38 kg/m  .  BMI Body mass index is 22.38 kg/m.  Wt Readings from Last 3 Encounters:  05/29/20 130 lb 6.4 oz (59.1 kg)  02/03/20 131 lb (59.4 kg)  11/29/19 130 lb 12.8 oz (59.3 kg)    General: Alert and in no acute distress.   Cardiac: Regular rate and rhythm. No murmurs, rubs, or gallops. No edema.  Respiratory:  Lungs are clear but with a few crackles in the bases that clear with coughing - she has normal work of breathing.  GI: Soft and nontender.  MS: No deformity or atrophy. Gait and ROM intact.  Skin: Warm and dry. Color is normal.  Neuro:  Strength and sensation are intact and no gross focal deficits noted.  Psych: Alert, appropriate and with normal affect.   LABORATORY DATA:  EKG:  EKG is not ordered today.   Lab Results  Component Value Date   WBC 5.2 03/21/2020   HGB 13.4 03/21/2020   HCT 41.1 03/21/2020   PLT 400 03/21/2020   GLUCOSE 116 (H) 05/25/2020   CHOL 87 (L) 11/29/2019   TRIG 114 11/29/2019   HDL 30 (L) 11/29/2019   LDLCALC 36 11/29/2019   ALT 17 03/21/2020   AST 22 03/21/2020   NA 140 05/25/2020   K 4.2  05/25/2020   CL 102 05/25/2020   CREATININE 0.70 05/25/2020   BUN 10 05/25/2020   CO2 29 05/25/2020   TSH 3.30 01/30/2020   HGBA1C 6.6 (H) 05/25/2020   MICROALBUR 1.6 05/25/2020       BNP (last 3 results) No results for input(s): BNP in the last 8760 hours.  ProBNP (last 3 results) No results for input(s): PROBNP in the last 8760 hours.  Other Studies Reviewed Today:  Echo Study Conclusions from 2014  - Left ventricle: The cavity size was normal. Wall thickness   was increased in a pattern of mild LVH. Systolic function   was normal. The estimated ejection fraction was in the   range of 55% to 60%. Wall motion was normal; there were no   regional wall motion abnormalities. - Mitral valve: Mild regurgitation. - Left atrium: The atrium was mildly dilated. - Atrial septum: Redundant but no obvious PFO/ASD No defect   or patent foramen ovale was identified.     Coronary angiography 2014: Coronary dominance: Right    Left Main:  Normal   Left Anterior Descending (LAD):  Normal in size with minor irregularities.  1st diagonal (D1):  Normal in size with no significant disease.  2nd diagonal (D2):  Medium in size with no significant disease.  3rd diagonal (D3):  Small in size with no significant disease.  Circumflex (LCx):  Normal in size with minor irregularities proximally. There is a 99% hazy stenosis in the midsegment which is likely the culprit for inferior ST elevation MI. The stenosis is at the origin of OM 2.  1st obtuse marginal:  Small in size with minor irregularities.  2nd obtuse marginal:  Normal in size with minor irregularities.  3rd obtuse marginal:  Normal in size with no significant disease.       Right Coronary Artery: normal in size with no significant disease.  Posterior descending artery: Normal  Posterior AV segment: Small in size with no significant disease.  Posterolateral branchs:  Small and normal posterolateral branches.   Left  ventriculography: Left ventricular systolic function is normal  , LVEF is estimated at 55 %, there is no significant mitral regurgitation    PCI Note:  Following the diagnostic procedure, the decision was made to proceed with PCI.  Weight-based bivalirudin was given for anticoagulation. Once a therapeutic ACT was achieved, a 6 [place] XB 3.0 guide catheter was inserted.  A intuition coronary guidewire was used to cross the lesion.  The lesion was predilated with a 2.5 x 12 balloon.  The lesion was then stented with a 2.5 x 18 mm Xience expedition drug-eluting stent. There was a significant vessel mismatch between the proximal and the distal segment of the stent. The stent was postdilated with a 3.0 x 12 noncompliant balloon.  Following PCI, there was 0% residual stenosis and TIMI-3 flow. Final angiography confirmed an excellent result. The patient tolerated the procedure well. There were no immediate procedural complications. A TR band was used for radial hemostasis. The patient was transferred to the post catheterization recovery area for further monitoring.   PCI Data: Vessel - left circumflex artery/Segment - mid Percent Stenosis (pre)  99% TIMI-flow 3 Stent : 2.5 x 18 mm Xience expedition drug-eluting stent postdilated with a 3.0 noncompliant balloon in the mid and proximal segment Percent Stenosis (post) 0% TIMI-flow (post) 3     Final Conclusions:  1. Severe one-vessel coronary artery disease with 99% hazy mid left circumflex stenosis which is the culprit for inferior ST elevation MI. 2. Normal LV systolic function and mildly elevated left ventricular end-diastolic pressure. 3. Successful angioplasty and drug-eluting stent placement to the left circumflex artery.   Recommendations:  Continue dual antiplatelet therapy for at least one year. Aggressive treatment of risk factors is recommended.   Kathlyn Sacramento MD, [facility] 05/15/2013, 2:51 AM     Assessment/Plan:  1. CAD - prior inferior  STEMI from 2014 with  PCI/DES to LCX - had single vessel CAD at that time and normal EF. Only on aspirin. No longer on Plavix due to anemia. Continue with statin and beta blocker therapy.   2. HTN - BP is fine on her current regimen - would continue Lopressor and Lisinopril.   3. HLD - on statin - labs from June noted - would continue.   4. Anemia - last blood count stable.   5. DM - per PCP  6. Prior COVID 19 illness - recovering. She had been vaccinated as well.   Current medicines are reviewed with the patient today.  The patient does not have concerns regarding medicines other than what has been noted above.  The following changes have been made:  See above.  Labs/ tests ordered today include:   No orders of the defined types were placed in this encounter.    Disposition:   FU with Dr. Marlou Porch in 6 months. They are aware that I am leaving in February.  Patient is agreeable to this plan and will call if any problems develop in the interim.   SignedTruitt Merle, NP  05/29/2020 8:20 AM  [facility] [address] [place], [place]  [address] Phone: [phone] Fax: [phone]

## 2020-05-25 ENCOUNTER — Other Ambulatory Visit (INDEPENDENT_AMBULATORY_CARE_PROVIDER_SITE_OTHER): Payer: Medicaid Other

## 2020-05-25 ENCOUNTER — Other Ambulatory Visit: Payer: Self-pay

## 2020-05-25 DIAGNOSIS — E1140 Type 2 diabetes mellitus with diabetic neuropathy, unspecified: Secondary | ICD-10-CM | POA: Diagnosis not present

## 2020-05-25 LAB — BASIC METABOLIC PANEL
BUN: 10 mg/dL (ref 6–23)
CO2: 29 mEq/L (ref 19–32)
Calcium: 10.1 mg/dL (ref 8.4–10.5)
Chloride: 102 mEq/L (ref 96–112)
Creatinine, Ser: 0.7 mg/dL (ref 0.40–1.20)
GFR: 90.36 mL/min (ref >60.00)
Glucose, Bld: 116 mg/dL — ABNORMAL HIGH (ref 70–99)
Potassium: 4.2 mEq/L (ref 3.5–5.1)
Sodium: 140 mEq/L (ref 135–145)

## 2020-05-25 LAB — MICROALBUMIN / CREATININE URINE RATIO
Creatinine,U: 133.8 mg/dL
Microalb Creat Ratio: 1.2 mg/g (ref 0.0–30.0)
Microalb, Ur: 1.6 mg/dL (ref 0.0–1.9)

## 2020-05-25 LAB — HEMOGLOBIN A1C: Hgb A1c MFr Bld: 6.6 % — ABNORMAL HIGH (ref 4.6–6.5)

## 2020-05-29 ENCOUNTER — Ambulatory Visit (INDEPENDENT_AMBULATORY_CARE_PROVIDER_SITE_OTHER): Payer: Medicaid Other | Admitting: Nurse Practitioner

## 2020-05-29 ENCOUNTER — Other Ambulatory Visit: Payer: Self-pay

## 2020-05-29 ENCOUNTER — Encounter: Payer: Self-pay | Admitting: Nurse Practitioner

## 2020-05-29 VITALS — BP 120/70 | HR 77 | Ht 64.0 in | Wt 130.4 lb

## 2020-05-29 DIAGNOSIS — D509 Iron deficiency anemia, unspecified: Secondary | ICD-10-CM

## 2020-05-29 DIAGNOSIS — E785 Hyperlipidemia, unspecified: Secondary | ICD-10-CM | POA: Diagnosis not present

## 2020-05-29 DIAGNOSIS — I2510 Atherosclerotic heart disease of native coronary artery without angina pectoris: Secondary | ICD-10-CM

## 2020-05-29 DIAGNOSIS — I10 Essential (primary) hypertension: Secondary | ICD-10-CM

## 2020-05-29 NOTE — Patient Instructions (Addendum)
After Visit Summary:  We will be checking the following labs today -  NONE   Medication Instructions:    Continue with your current medicines.    If you need a refill on your cardiac medications before your next appointment, please call your pharmacy.     Testing/Procedures To Be Arranged:  N/A  Follow-Up:   See Dr. Marlou Porch in 6 months.     At [facility], you and your health needs are our priority.  As part of our continuing mission to provide you with exceptional heart care, we have created designated Provider Care Teams.  These Care Teams include your primary Cardiologist (physician) and Advanced Practice Providers (APPs -  Physician Assistants and Nurse Practitioners) who all work together to provide you with the care you need, when you need it.  Special Instructions:  . Stay safe, wash your hands for at least 20 seconds and wear a mask when needed.  . It was good to talk with you today.    Call the [place] office at [phone] if you have any questions, problems or concerns.

## 2020-05-31 ENCOUNTER — Ambulatory Visit (INDEPENDENT_AMBULATORY_CARE_PROVIDER_SITE_OTHER): Payer: Medicaid Other | Admitting: Endocrinology

## 2020-05-31 ENCOUNTER — Encounter: Payer: Self-pay | Admitting: Endocrinology

## 2020-05-31 ENCOUNTER — Other Ambulatory Visit: Payer: Self-pay

## 2020-05-31 VITALS — BP 122/82 | HR 70 | Ht 62.0 in | Wt 132.2 lb

## 2020-05-31 DIAGNOSIS — G5603 Carpal tunnel syndrome, bilateral upper limbs: Secondary | ICD-10-CM | POA: Diagnosis not present

## 2020-05-31 DIAGNOSIS — E039 Hypothyroidism, unspecified: Secondary | ICD-10-CM

## 2020-05-31 DIAGNOSIS — Z23 Encounter for immunization: Secondary | ICD-10-CM

## 2020-05-31 DIAGNOSIS — E1140 Type 2 diabetes mellitus with diabetic neuropathy, unspecified: Secondary | ICD-10-CM

## 2020-05-31 DIAGNOSIS — I10 Essential (primary) hypertension: Secondary | ICD-10-CM | POA: Diagnosis not present

## 2020-05-31 NOTE — Progress Notes (Signed)
Patient ID: [name] [name], female   DOB: 1956/05/01, 64 y.o.   MRN: [id number]           Reason for Appointment: Follow-up    History of Present Illness:          Diagnosis: Type 2 diabetes mellitus, date of diagnosis: ?  2014   Past history:   Her son thinks that she may have had diabetes diagnosed when she was in [place] When she moved to [facility] in 2014 she was not on any medications She was however found to have significant hyperglycemia with glucose 364 and A1c of 11.7 when she was admitted for her coronary artery disease in 04/2013. At that time she was started on premixed insulin twice a day Her level of control had not been adequate with persistently high A1c readings She did not tolerate Victoza well and was having decreased appetite, malaise and nausea and this was stopped subsequently  Recent history:        Oral hypoglycemic drugs:     Metformin ER 500 mg 4 tablets daily, Invokana 100 mg daily, Precose 25 mg before meals   Her A1c has improved to 6.6   Current blood sugar patterns and problems:  She is checking her blood sugars fairly consistently at various times including after meals  She will have relatively high readings after breakfast but not consistently  However she thinks she is consistently taking it because before eating each meal  Only recently has started back some walking, previously had fatigue from Covid  Weight is about the same  Diet is about the same as below   Side effects from medications have been: None  Self-care: The diet that the patient has been following is: None, has vegetarian diet Usually having vegetables and roti with lunch and dinner and sometimes having lentils.   She does not like yogurt as it apparently causes heartburn  Usually not eating fried food                  Dietician visit, none   Glucose monitoring:  done usually 2 times a day       Glucometer: Accu-Chek  Blood Glucose readings by meter download  and average readings as follows:   PRE-MEAL Fasting Lunch Dinner Bedtime Overall  Glucose range:  103-157     87-175  Mean/median:  121      121   POST-MEAL PC Breakfast PC Lunch PC Dinner  Glucose range:  135 174    Mean/median: 156  114  123   Previous data:   PRE-MEAL Fasting Lunch Dinner Bedtime Overall  Glucose range:  100-144      Mean/median:  115    122   POST-MEAL PC Breakfast PC Lunch PC Dinner  Glucose range:   94-150  103-180  Mean/median:  118  124  128    Weight history:   Wt Readings from Last 3 Encounters:  05/31/20 132 lb 3.2 oz (60 kg)  05/29/20 130 lb 6.4 oz (59.1 kg)  02/03/20 131 lb (59.4 kg)    Glycemic control:   Lab Results  Component Value Date   HGBA1C 6.6 (H) 05/25/2020   HGBA1C 6.9 (H) 01/30/2020   HGBA1C 7.0 (H) 09/26/2019   Lab Results  Component Value Date   MICROALBUR 1.6 05/25/2020   LDLCALC 36 11/29/2019   CREATININE 0.70 05/25/2020    Lab on 05/25/2020  Component Date Value Ref Range Status  . Microalb, Ur 05/25/2020 1.6  0.0 - 1.9 mg/dL Final  . Creatinine,U 05/25/2020 133.8  mg/dL Final  . Microalb Creat Ratio 05/25/2020 1.2  0.0 - 30.0 mg/g Final  . Sodium 05/25/2020 140  135 - 145 mEq/L Final  . Potassium 05/25/2020 4.2  3.5 - 5.1 mEq/L Final  . Chloride 05/25/2020 102  96 - 112 mEq/L Final  . CO2 05/25/2020 29  19 - 32 mEq/L Final  . Glucose, Bld 05/25/2020 116* 70 - 99 mg/dL Final  . BUN 05/25/2020 10  6 - 23 mg/dL Final  . Creatinine, Ser 05/25/2020 0.70  0.40 - 1.20 mg/dL Final  . GFR 05/25/2020 90.36  >60.00 mL/min Final   Calculated using the CKD-EPI Creatinine Equation (2021)  . Calcium 05/25/2020 10.1  8.4 - 10.5 mg/dL Final  . Hgb A1c MFr Bld 05/25/2020 6.6* 4.6 - 6.5 % Final   Glycemic Control Guidelines for People with Diabetes:Non Diabetic:  <6%Goal of Therapy: <7%Additional Action Suggested:  >8%       Allergies as of 05/31/2020   No Known Allergies     Medication List       Accurate as of  May 31, 2020  9:58 AM. If you have any questions, ask your nurse or doctor.        acarbose 25 MG tablet Commonly known as: PRECOSE TAKE 1 TABLET BY MOUTH AT BREAKFAST THEN TAKE 2 TABLETS BY MOUTH AT LUNCH AND 1 TABLET BY MOUTH AT DINNER   Accu-Chek FastClix Lancets Misc USE TO CHECK BLOOD SUGAR 3 TIMES PER DAY   Accu-Chek SmartView test strip Generic drug: glucose blood USE THREE TIMES PER DAY TO CHECK BLOOD SUGAR   albuterol 108 (90 Base) MCG/ACT inhaler Commonly known as: VENTOLIN HFA Inhale 1-2 puffs into the lungs every 6 (six) hours as needed for wheezing or shortness of breath.   aspirin EC 81 MG tablet Take 1 tablet (81 mg total) by mouth daily.   atorvastatin 80 MG tablet Commonly known as: LIPITOR TAKE 1 TABLET (80 MG TOTAL) BY MOUTH DAILY AT 6 PM.   gabapentin 100 MG capsule Commonly known as: NEURONTIN TAKE 2 CAPSULES BY MOUTH AT BEDTIME.   Invokana 100 MG Tabs tablet Generic drug: canagliflozin TAKE 1 TABLET (100 MG TOTAL) BY MOUTH DAILY.   levothyroxine 25 MCG tablet Commonly known as: SYNTHROID TAKE 1 TABLET (25 MCG TOTAL) BY MOUTH DAILY BEFORE BREAKFAST.   lisinopril 20 MG tablet Commonly known as: ZESTRIL TAKE ONE TABLET BY MOUTH ONCE DAILY   metFORMIN 500 MG 24 hr tablet Commonly known as: GLUCOPHAGE-XR TAKE 4 TABLETS BY MOUTH DAILY WITH SUPPER   metoprolol tartrate 25 MG tablet Commonly known as: LOPRESSOR TAKE ONE-HALF TABLET (1/2) BY MOUTH 2 (TWO) TIMES DAILY   nitroGLYCERIN 0.4 MG SL tablet Commonly known as: NITROSTAT Place 1 tablet (0.4 mg total) under the tongue every 5 (five) minutes as needed for chest pain.   ondansetron 4 MG disintegrating tablet Commonly known as: Zofran ODT Take 1 tablet (4 mg total) by mouth every 8 (eight) hours as needed for nausea or vomiting.   pantoprazole 40 MG tablet Commonly known as: PROTONIX TAKE 1 TABLET (40 MG TOTAL) BY MOUTH DAILY.       Allergies: No Known Allergies  Past Medical  History:  Diagnosis Date  . Anemia   . CAD (coronary artery disease)    a. 04/2013 Inf STEMI/PCI: LM nl, LAD min irregs, D1/2/3 nl, LCX 43m (2.5x18 Xience DES), OM1/2/3 nl, RCA nl, PDA nl, RPAV nl,  RPL nl, EF 55%;  b. 04/2013 Echo: EF 55-60%, no rwma, mild MR, mildly dil LA.  . Diabetes mellitus type 2, uncontrolled ([place])    a. 04/2013 HbA1c = 11.7.  . GERD (gastroesophageal reflux disease)   . Hyperlipidemia   . Hypertension   . Hypothyroidism     Past Surgical History:  Procedure Laterality Date  . CESAREAN SECTION    . LEFT HEART CATH  05-15-13  . LEFT HEART CATHETERIZATION WITH CORONARY ANGIOGRAM N/A 05/15/2013   Procedure: LEFT HEART CATHETERIZATION WITH CORONARY ANGIOGRAM;  Surgeon: Wellington Hampshire, MD;  Location: [facility] C[facility];  Service: Cardiovascular;  Laterality: N/A;  . PERCUTANEOUS STENT INTERVENTION  05/15/2013   Procedure: PERCUTANEOUS STENT INTERVENTION;  Surgeon: Wellington Hampshire, MD;  Location: [facility];  Service: Cardiovascular;;    Family History  Problem Relation Age of Onset  . Heart disease Mother   . Diabetes Mother   . Hypertension Mother   . Heart disease Father   . Diabetes Father   . Hypertension Father   . Diabetes Brother     Social History:  reports that she has never smoked. She has never used smokeless tobacco. She reports that she does not drink alcohol and does not use drugs.    Review of Systems   HYPOTHYROIDISM: Screening TSH had been done because of her complaints of carpal tunnel syndrome and some fatigue Because of her high TSH she has been on levothyroxine 25 mcg daily No recent fatigue  She is taking this before breakfast in the morning daily  TSH is consistently normal No history of goiter  Last TSH:  Lab Results  Component Value Date   TSH 3.30 01/30/2020   TSH 3.71 09/26/2019   TSH 3.18 06/20/2019   FREET4 0.96 06/20/2019   FREET4 0.68 09/03/2017     NEUROPATHY: She has  symptoms of numbness and some tingling in  her hands and usually when waking up Has only minimal pains in her feet with some burning She is now complaining about the numbness in her hands and tingling during the night despite taking gabapentin  Symptoms are improved with gabapentin  Lipids: She is treated with Lipitor 80 mg for several years Lipids are well controlled, has history of CAD followed by cardiologist      Lab Results  Component Value Date   CHOL 87 (L) 11/29/2019   HDL 30 (L) 11/29/2019   LDLCALC 36 11/29/2019   TRIG 114 11/29/2019   CHOLHDL 2.9 11/29/2019                 HYPERTENSION   The blood pressure has been controlled with lisinopril 20 mg  Has a blood pressure meter from [employer] for home monitoring    BP Readings from Last 3 Encounters:  05/31/20 122/82  05/29/20 120/70  03/21/20 (!) 142/73   She had a screening B12 level done because of neuropathic symptoms and long-term Metformin Had a low level at baseline and has been supplemented  Lab Results  Component Value Date   VITAMINB12 >1500 (H) 09/26/2019    No history of anemia  Lab Results  Component Value Date   HGB 13.4 03/21/2020    She had the J&J Covid vaccine in April 21 and booster in 12/21  LABS:  Lab on 05/25/2020  Component Date Value Ref Range Status  . Microalb, Ur 05/25/2020 1.6  0.0 - 1.9 mg/dL Final  . Creatinine,U 05/25/2020 133.8  mg/dL Final  . Microalb  Creat Ratio 05/25/2020 1.2  0.0 - 30.0 mg/g Final  . Sodium 05/25/2020 140  135 - 145 mEq/L Final  . Potassium 05/25/2020 4.2  3.5 - 5.1 mEq/L Final  . Chloride 05/25/2020 102  96 - 112 mEq/L Final  . CO2 05/25/2020 29  19 - 32 mEq/L Final  . Glucose, Bld 05/25/2020 116* 70 - 99 mg/dL Final  . BUN 05/25/2020 10  6 - 23 mg/dL Final  . Creatinine, Ser 05/25/2020 0.70  0.40 - 1.20 mg/dL Final  . GFR 05/25/2020 90.36  >60.00 mL/min Final   Calculated using the CKD-EPI Creatinine Equation (2021)  . Calcium 05/25/2020 10.1  8.4 - 10.5 mg/dL Final  . Hgb A1c MFr Bld  05/25/2020 6.6* 4.6 - 6.5 % Final   Glycemic Control Guidelines for People with Diabetes:Non Diabetic:  <6%Goal of Therapy: <7%Additional Action Suggested:  >8%     Physical Examination:  BP 122/82   Pulse 70   Ht 5\' 2"  (1.575 m)   Wt 132 lb 3.2 oz (60 kg)   SpO2 97%   BMI 24.18 kg/m    Tinel's sign significantly positive on the left  ASSESSMENT:  Diabetes type 2, non-insulin requiring  See history of present illness for discussion of current diabetes management, blood sugar patterns and problems identified  A1c is excellent at 6.6, previously 6.9 and stable  She is on Invokana, metformin 2 g daily and Precose  Her blood sugars are still very consistently controlled Highest blood sugar recently is 175 As before may have occasional high readings especially after breakfast since she has relatively low protein vegetarian diet  However fasting blood sugars are fairly good and averaging about 120  She does report taking Precose consistently at meals Weight is stable  Hypothyroidism: Mild and supplemented with 25 mcg levothyroxine   HYPERTENSION: Blood pressure is controlled on 20 mg lisinopril  Microalbumin normal  PLAN:   She will make sure she has some protein at each meal Continue regular walking To call if blood sugars are consistently high  Check blood pressure periodically at home  Wrist splint for carpal tunnel prescribed and she will also need to follow-up with a new PCP for general care May need further management if not improved  Follow-up in 4 months   There are no Patient Instructions on file for this visit.  Elayne Snare 05/31/2020, 9:58 AM    Note: This office note was prepared with Dragon voice recognition system technology. Any transcriptional errors that result from this process are unintentional.

## 2020-06-19 ENCOUNTER — Other Ambulatory Visit: Payer: Self-pay | Admitting: Endocrinology

## 2020-07-19 ENCOUNTER — Telehealth: Payer: Self-pay | Admitting: Endocrinology

## 2020-07-19 ENCOUNTER — Telehealth: Payer: Self-pay | Admitting: Nurse Practitioner

## 2020-07-19 DIAGNOSIS — I10 Essential (primary) hypertension: Secondary | ICD-10-CM

## 2020-07-19 MED ORDER — LISINOPRIL 20 MG PO TABS
20.0000 mg | ORAL_TABLET | Freq: Every day | ORAL | 11 refills | Status: DC
Start: 2020-07-19 — End: 2021-06-06

## 2020-07-19 MED ORDER — PANTOPRAZOLE SODIUM 40 MG PO TBEC: 40.0000 mg | DELAYED_RELEASE_TABLET | Freq: Every day | ORAL | 11 refills | Status: DC

## 2020-07-19 MED ORDER — METOPROLOL TARTRATE 25 MG PO TABS: 12.5000 mg | ORAL_TABLET | Freq: Two times a day (BID) | ORAL | 11 refills | Status: DC

## 2020-07-19 MED ORDER — ATORVASTATIN CALCIUM 80 MG PO TABS: 80.0000 mg | ORAL_TABLET | Freq: Every day | ORAL | 11 refills | Status: DC

## 2020-07-19 NOTE — Telephone Encounter (Signed)
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  Pharmacy requesting a call to clarify [phone]

## 2020-07-19 NOTE — Telephone Encounter (Signed)
Pharmacy called asking to speak to a nurse regarding pt's medication.   Ph# [phone] ask for [facility] or Christy Sartorius

## 2020-07-19 NOTE — Telephone Encounter (Signed)
Called and spoke with pharmacist - medication is covered under pt's insurnace.  The issue is Truitt Merle, NP is not listed as an approved provider for Medicaid. Dr Marlou Porch is.  RXs will be resent to pharmacy under his name.

## 2020-07-19 NOTE — Telephone Encounter (Signed)
Pt c/o medication issue:  1. Name of Medication:  pantoprazole (PROTONIX) 40 MG tablet metoprolol tartrate (LOPRESSOR) 25 MG tablet lisinopril (ZESTRIL) 20 MG tablet atorvastatin (LIPITOR) 80 MG tablet  2. How are you currently taking this medication (dosage and times per day)? N/a - patient taking as precribed  3. Are you having a reaction (difficulty breathing--STAT)? N/a  4. What is your medication issue? Dairys with [employer] states the [employer] will not cover these medications. Please return call to discuss further.  Phone #: [phone]

## 2020-07-20 ENCOUNTER — Telehealth: Payer: Self-pay

## 2020-07-20 ENCOUNTER — Other Ambulatory Visit: Payer: Self-pay

## 2020-07-20 MED ORDER — GABAPENTIN 100 MG PO CAPS: 200.0000 mg | ORAL_CAPSULE | Freq: Every day | ORAL | 0 refills | Status: DC

## 2020-07-20 MED ORDER — ACCU-CHEK SMARTVIEW VI STRP: ORAL_STRIP | 99 refills | Status: DC

## 2020-07-20 MED ORDER — METFORMIN HCL ER 500 MG PO TB24: ORAL_TABLET | ORAL | 0 refills | Status: DC

## 2020-07-20 MED ORDER — ACARBOSE 25 MG PO TABS: ORAL_TABLET | ORAL | 0 refills | Status: DC

## 2020-07-20 MED ORDER — ACCU-CHEK FASTCLIX LANCETS MISC: 3 refills | Status: DC

## 2020-07-20 MED ORDER — LEVOTHYROXINE SODIUM 25 MCG PO TABS: 25.0000 ug | ORAL_TABLET | Freq: Every day | ORAL | 0 refills | Status: DC

## 2020-07-20 NOTE — Telephone Encounter (Signed)
Referring to on- call provider Dr Loanne Drilling for 1 time/30 day refill for   [place], [place] Phone:  [phone]  Fax:  [phone]       levothyroxine (SYNTHROID) 25 MCG tablet  gabapentin (NEURONTIN) 100 MG capsule  metFORMIN (GLUCOPHAGE-XR) 500 MG 24 hr tablet  Accu-Chek FastClix Lancets MISC  ACCU-CHEK SMARTVIEW test strip    acarbose (PRECOSE) 25 MG tablet

## 2020-07-20 NOTE — Telephone Encounter (Signed)
Per Dr. Loanne Drilling:  Madaline Brilliant to refill levothyroxine, gabapentin, metformin, Accu-Chek FastClix lancets, Accu-Chek Smartview test strips and Acarbose for a 30 day supply in prescribing provider's absence.

## 2020-07-20 NOTE — Telephone Encounter (Signed)
Dairys with [employer] called again and requests to be called at Ph# [phone] (ask for [employer] or Christy Sartorius)  re: No response to previous messages left and states that Patient is completely out of the following medications:  levothyroxine (SYNTHROID) 25 MCG tablet  gabapentin (NEURONTIN) 100 MG capsule  metFORMIN (GLUCOPHAGE-XR) 500 MG 24 hr tablet  Accu-Chek FastClix Lancets MISC  ACCU-CHEK SMARTVIEW test strip  AND  acarbose (PRECOSE) 25 MG tablet  Dairys states that due to Dr. Dwyane Dee not being enrolled in the [facility] Program, Medicaid will not pay for the above RX's, therefore, Dairys requests that a different Provider send the RX's for the medications listed above to:  [place], [place] Phone:  [phone]  Fax:  [phone]

## 2020-07-20 NOTE — Telephone Encounter (Signed)
Patient's son is calling about situation with all of this patient's refill requests and Dr Dwyane Dee not being shown as a Medicaid provider currently - Pharmacy has called as well.  Apparently Cardiology was able to temporarily bypass this situation by having another provider in their practice refill the RX so that the patient does not go without medication while the Medicaid problem is resolved on our side of things.  Please contact pharmacy and patient with resolution(s)

## 2020-07-23 NOTE — Telephone Encounter (Signed)
I spoke with the pharmacist who said he was able to fill patients prescriptions last month with no problem, but he got an error message this month when he tried to fill it.  He said he did call her [facility] insurance and they said that Dr. Dwyane Dee is out of  network?  Please advise.

## 2020-07-24 NOTE — Telephone Encounter (Signed)
We may be out of network with certain Medicaid plans, Melissa to check.  If so she will need to find someone in network

## 2020-07-24 NOTE — Telephone Encounter (Signed)
I am currently working on this issue. I will provide an update as soon as I receive a response.

## 2020-08-02 DIAGNOSIS — H268 Other specified cataract: Secondary | ICD-10-CM | POA: Diagnosis not present

## 2020-08-16 ENCOUNTER — Other Ambulatory Visit: Payer: Self-pay | Admitting: Endocrinology

## 2020-08-17 ENCOUNTER — Other Ambulatory Visit: Payer: Self-pay | Admitting: Endocrinology

## 2020-09-18 ENCOUNTER — Other Ambulatory Visit: Payer: Self-pay | Admitting: Endocrinology

## 2020-10-01 ENCOUNTER — Other Ambulatory Visit (INDEPENDENT_AMBULATORY_CARE_PROVIDER_SITE_OTHER): Payer: Medicaid Other

## 2020-10-01 ENCOUNTER — Other Ambulatory Visit: Payer: Self-pay

## 2020-10-01 DIAGNOSIS — E039 Hypothyroidism, unspecified: Secondary | ICD-10-CM

## 2020-10-01 DIAGNOSIS — E1140 Type 2 diabetes mellitus with diabetic neuropathy, unspecified: Secondary | ICD-10-CM

## 2020-10-01 LAB — LIPID PANEL
Cholesterol: 101 mg/dL (ref 0–200)
HDL: 36.9 mg/dL — ABNORMAL LOW (ref >39.00)
LDL Cholesterol: 40 mg/dL (ref 0–99)
NonHDL: 63.81
Total CHOL/HDL Ratio: 3
Triglycerides: 118 mg/dL (ref 0.0–149.0)
VLDL: 23.6 mg/dL (ref 0.0–40.0)

## 2020-10-01 LAB — T4, FREE: Free T4: 0.93 ng/dL (ref 0.60–1.60)

## 2020-10-01 LAB — BASIC METABOLIC PANEL
BUN: 14 mg/dL (ref 6–23)
CO2: 29 mEq/L (ref 19–32)
Calcium: 10.2 mg/dL (ref 8.4–10.5)
Chloride: 103 mEq/L (ref 96–112)
Creatinine, Ser: 0.8 mg/dL (ref 0.40–1.20)
GFR: 76.79 mL/min (ref >60.00)
Glucose, Bld: 123 mg/dL — ABNORMAL HIGH (ref 70–99)
Potassium: 4.8 mEq/L (ref 3.5–5.1)
Sodium: 139 mEq/L (ref 135–145)

## 2020-10-01 LAB — TSH: TSH: 7.84 u[IU]/mL — ABNORMAL HIGH (ref 0.35–4.50)

## 2020-10-01 LAB — HEMOGLOBIN A1C: Hgb A1c MFr Bld: 7.1 % — ABNORMAL HIGH (ref 4.6–6.5)

## 2020-10-03 ENCOUNTER — Other Ambulatory Visit: Payer: Self-pay

## 2020-10-03 ENCOUNTER — Encounter: Payer: Self-pay | Admitting: Endocrinology

## 2020-10-03 ENCOUNTER — Ambulatory Visit: Payer: Medicaid Other | Admitting: Endocrinology

## 2020-10-03 VITALS — BP 104/68 | HR 72 | Ht 62.0 in | Wt 129.4 lb

## 2020-10-03 DIAGNOSIS — E1165 Type 2 diabetes mellitus with hyperglycemia: Secondary | ICD-10-CM | POA: Diagnosis not present

## 2020-10-03 DIAGNOSIS — E039 Hypothyroidism, unspecified: Secondary | ICD-10-CM | POA: Diagnosis not present

## 2020-10-03 DIAGNOSIS — I10 Essential (primary) hypertension: Secondary | ICD-10-CM | POA: Diagnosis not present

## 2020-10-03 MED ORDER — LEVOTHYROXINE SODIUM 50 MCG PO TABS: 50.0000 ug | ORAL_TABLET | Freq: Every day | ORAL | 3 refills | Status: DC

## 2020-10-03 NOTE — Patient Instructions (Addendum)
Take 50ug levothyroxine in am  Lisinopril 10mg  not 20mg  daily  Check BP at home  Check blood sugars on waking up 1-2 days a week  Also check blood sugars about 2 hours after meals and do this after different meals by rotation  Recommended blood sugar levels on waking up are 90-130 and about 2 hours after meal is 130-160  Please bring your blood sugar monitor to each visit, thank you

## 2020-10-03 NOTE — Progress Notes (Signed)
Patient ID: [name] [name], female   DOB: Jul 08, 1955, 65 y.o.   MRN: [id number]           Reason for Appointment: Follow-up    History of Present Illness:          Diagnosis: Type 2 diabetes mellitus, date of diagnosis: ?  2014   Past history:   Her son thinks that she may have had diabetes diagnosed when she was in [place] When she moved to [facility] in 2014 she was not on any medications She was however found to have significant hyperglycemia with glucose 364 and A1c of 11.7 when she was admitted for her coronary artery disease in 04/2013. At that time she was started on premixed insulin twice a day Her level of control had not been adequate with persistently high A1c readings She did not tolerate Victoza well and was having decreased appetite, malaise and nausea and this was stopped subsequently  Recent history:        Oral hypoglycemic drugs:     Metformin ER 500 mg 4 tablets daily, Invokana 100 mg daily, Precose 25 mg before meals   Her A1c has gone up slightly to 7.1 compared to 6.6   Current blood sugar patterns and problems:  She is checking her blood sugars fairly consistently at various times but mostly after meals but not fasting  Fasting lab glucose was 123  Overall she still has relatively low appetite because of post COVID symptoms  Has lost weight  Blood sugars are excellent at home with slightly higher readings after breakfast but under 140 average after lunch and dinner  Not clear why her A1c is relatively higher  Diet has not changed  She has had intermittent exercise with walking, has not done much when she had cataract surgery  As before has issues with not getting enough protein especially at breakfast   Side effects from medications have been: None  Self-care: The diet that the patient has been following is: None, has vegetarian diet Usually having vegetables and roti with lunch and dinner and sometimes having lentils.   She does not  like yogurt as it apparently causes heartburn  Usually not eating fried food                  Dietician visit, none   Glucose monitoring:  done usually 2 times a day       Glucometer: Accu-Chek  Blood Glucose readings by meter download and average readings as follows:   PRE-MEAL Fasting Lunch Dinner Bedtime Overall  Glucose range:  127    .  88-190  Mean/median:     138   POST-MEAL PC Breakfast PC Lunch PC Dinner  Glucose range:  135-190    Mean/median:  156  122  139   Previously:  PRE-MEAL Fasting Lunch Dinner Bedtime Overall  Glucose range:  103-157     87-175  Mean/median:  121      121   POST-MEAL PC Breakfast PC Lunch PC Dinner  Glucose range:  135 174    Mean/median: 156  114  123       Weight history:   Wt Readings from Last 3 Encounters:  10/03/20 129 lb 6.4 oz (58.7 kg)  05/31/20 132 lb 3.2 oz (60 kg)  05/29/20 130 lb 6.4 oz (59.1 kg)    Glycemic control:   Lab Results  Component Value Date   HGBA1C 7.1 (H) 10/01/2020   HGBA1C 6.6 (H) 05/25/2020  HGBA1C 6.9 (H) 01/30/2020   Lab Results  Component Value Date   MICROALBUR 1.6 05/25/2020   LDLCALC 40 10/01/2020   CREATININE 0.80 10/01/2020    Lab on 10/01/2020  Component Date Value Ref Range Status  . Free T4 10/01/2020 0.93  0.60 - 1.60 ng/dL Final   Comment: Specimens from patients who are undergoing biotin therapy and /or ingesting biotin supplements may contain high levels of biotin.  The higher biotin concentration in these specimens interferes with this Free T4 assay.  Specimens that contain high levels  of biotin may cause false high results for this Free T4 assay.  Please interpret results in light of the total clinical presentation of the patient.    Marland Kitchen TSH 10/01/2020 7.84* 0.35 - 4.50 uIU/mL Final  . Cholesterol 10/01/2020 101  0 - 200 mg/dL Final   ATP III Classification       Desirable:  < 200 mg/dL               Borderline High:  200 - 239 mg/dL          High:  > = 240 mg/dL  .  Triglycerides 10/01/2020 118.0  0.0 - 149.0 mg/dL Final   Normal:  <150 mg/dLBorderline High:  150 - 199 mg/dL  . HDL 10/01/2020 36.90* >39.00 mg/dL Final  . VLDL 10/01/2020 23.6  0.0 - 40.0 mg/dL Final  . LDL Cholesterol 10/01/2020 40  0 - 99 mg/dL Final  . Total CHOL/HDL Ratio 10/01/2020 3   Final                  Men          Women1/2 Average Risk     3.4          3.3Average Risk          5.0          4.42X Average Risk          9.6          7.13X Average Risk          15.0          11.0                      . NonHDL 10/01/2020 63.81   Final   NOTE:  Non-HDL goal should be 30 mg/dL higher than patient's LDL goal (i.e. LDL goal of < 70 mg/dL, would have non-HDL goal of < 100 mg/dL)  . Sodium 10/01/2020 139  135 - 145 mEq/L Final  . Potassium 10/01/2020 4.8  3.5 - 5.1 mEq/L Final  . Chloride 10/01/2020 103  96 - 112 mEq/L Final  . CO2 10/01/2020 29  19 - 32 mEq/L Final  . Glucose, Bld 10/01/2020 123* 70 - 99 mg/dL Final  . BUN 10/01/2020 14  6 - 23 mg/dL Final  . Creatinine, Ser 10/01/2020 0.80  0.40 - 1.20 mg/dL Final  . GFR 10/01/2020 76.79  >60.00 mL/min Final   Calculated using the CKD-EPI Creatinine Equation (2021)  . Calcium 10/01/2020 10.2  8.4 - 10.5 mg/dL Final  . Hgb A1c MFr Bld 10/01/2020 7.1* 4.6 - 6.5 % Final   Glycemic Control Guidelines for People with Diabetes:Non Diabetic:  <6%Goal of Therapy: <7%Additional Action Suggested:  >8%       Allergies as of 10/03/2020   No Known Allergies     Medication List       Accurate as of October 03, 2020 10:17 AM. If you have any questions, ask your nurse or doctor.        acarbose 25 MG tablet Commonly known as: PRECOSE TAKE 1 TABLET BY MOUTH AT BREAKFAST, THEN TAKE 2 TABLETS BY MOUTH AT [facility] AND 1 TABLET BY MOUTH AT DINNER   Accu-Chek FastClix Lancets Misc USE TO CHECK BLOOD SUGAR 3 TIMES PER DAY   Accu-Chek SmartView test strip Generic drug: glucose blood USE THREE TIMES PER DAY TO CHECK BLOOD SUGAR   aspirin EC 81  MG tablet Take 1 tablet (81 mg total) by mouth daily.   atorvastatin 80 MG tablet Commonly known as: LIPITOR Take 1 tablet (80 mg total) by mouth daily at 6 PM.   gabapentin 100 MG capsule Commonly known as: NEURONTIN TAKE 2 CAPSULES (200 MG TOTAL) BY MOUTH AT BEDTIME.   Invokana 100 MG Tabs tablet Generic drug: canagliflozin TAKE 1 TABLET (100 MG TOTAL) BY MOUTH DAILY.   levothyroxine 25 MCG tablet Commonly known as: SYNTHROID TAKE 1 TABLET (25 MCG TOTAL) BY MOUTH DAILY BEFORE BREAKFAST.   lisinopril 20 MG tablet Commonly known as: ZESTRIL Take 1 tablet (20 mg total) by mouth daily.   metFORMIN 500 MG 24 hr tablet Commonly known as: GLUCOPHAGE-XR TAKE 4 TABLETS BY MOUTH DAILY WITH SUPPER   metoprolol tartrate 25 MG tablet Commonly known as: LOPRESSOR Take 0.5 tablets (12.5 mg total) by mouth 2 (two) times daily.   nitroGLYCERIN 0.4 MG SL tablet Commonly known as: NITROSTAT Place 1 tablet (0.4 mg total) under the tongue every 5 (five) minutes as needed for chest pain.   ondansetron 4 MG disintegrating tablet Commonly known as: Zofran ODT Take 1 tablet (4 mg total) by mouth every 8 (eight) hours as needed for nausea or vomiting.   pantoprazole 40 MG tablet Commonly known as: PROTONIX Take 1 tablet (40 mg total) by mouth daily.       Allergies: No Known Allergies  Past Medical History:  Diagnosis Date  . Anemia   . CAD (coronary artery disease)    a. 04/2013 Inf STEMI/PCI: LM nl, LAD min irregs, D1/2/3 nl, LCX 32m (2.5x18 Xience DES), OM1/2/3 nl, RCA nl, PDA nl, RPAV nl, RPL nl, EF 55%;  b. 04/2013 Echo: EF 55-60%, no rwma, mild MR, mildly dil LA.  . Diabetes mellitus type 2, uncontrolled ([place])    a. 04/2013 HbA1c = 11.7.  . GERD (gastroesophageal reflux disease)   . Hyperlipidemia   . Hypertension   . Hypothyroidism     Past Surgical History:  Procedure Laterality Date  . CESAREAN SECTION    . LEFT HEART CATH  05-15-13  . LEFT HEART CATHETERIZATION WITH  CORONARY ANGIOGRAM N/A 05/15/2013   Procedure: LEFT HEART CATHETERIZATION WITH CORONARY ANGIOGRAM;  Surgeon: Wellington Hampshire, MD;  Location: [place] CATH LAB;  Service: Cardiovascular;  Laterality: N/A;  . PERCUTANEOUS STENT INTERVENTION  05/15/2013   Procedure: PERCUTANEOUS STENT INTERVENTION;  Surgeon: Wellington Hampshire, MD;  Location: [place] CATH LAB;  Service: Cardiovascular;;    Family History  Problem Relation Age of Onset  . Heart disease Mother   . Diabetes Mother   . Hypertension Mother   . Heart disease Father   . Diabetes Father   . Hypertension Father   . Diabetes Brother     Social History:  reports that she has never smoked. She has never used smokeless tobacco. She reports that she does not drink alcohol and does not use drugs.    Review of  Systems   HYPOTHYROIDISM: Screening TSH had been done because of her complaints of carpal tunnel syndrome and some fatigue Because of her high TSH she has been on levothyroxine 25 mcg daily He takes this regularly before breakfast and has not missed any doses lately  She is now concerned about some hair loss and also complains of some fatigue Previously TSH has been consistently normal but now higher  No history of goiter  Last TSH:  Lab Results  Component Value Date   TSH 7.84 (H) 10/01/2020   TSH 3.30 01/30/2020   TSH 3.71 09/26/2019   FREET4 0.93 10/01/2020   FREET4 0.96 06/20/2019   FREET4 0.68 09/03/2017     NEUROPATHY: She has  symptoms of numbness and some tingling in her hands and usually when waking up Has only minimal pains in her feet with some burning She is now complaining about the numbness in her hands and tingling during the night despite taking gabapentin  Symptoms are improved with gabapentin  Lipids: She is treated with Lipitor 80 mg for several years Lipids are well controlled, has history of CAD followed by cardiologist      Lab Results  Component Value Date   CHOL 101 10/01/2020   HDL 36.90 (L)  10/01/2020   LDLCALC 40 10/01/2020   TRIG 118.0 10/01/2020   CHOLHDL 3 10/01/2020                 HYPERTENSION   The blood pressure has been controlled with lisinopril 20 mg    BP Readings from Last 3 Encounters:  10/03/20 100/72  05/31/20 122/82  05/29/20 120/70   She had a screening B12 level done because of neuropathic symptoms and long-term Metformin Had a low level at baseline and has been supplemented  Lab Results  Component Value Date   VITAMINB12 >1500 (H) 09/26/2019    No history of anemia  Lab Results  Component Value Date   HGB 13.4 03/21/2020    She had the J&J Covid vaccine in April 21 and booster in 12/21  LABS:  Lab on 10/01/2020  Component Date Value Ref Range Status  . Free T4 10/01/2020 0.93  0.60 - 1.60 ng/dL Final   Comment: Specimens from patients who are undergoing biotin therapy and /or ingesting biotin supplements may contain high levels of biotin.  The higher biotin concentration in these specimens interferes with this Free T4 assay.  Specimens that contain high levels  of biotin may cause false high results for this Free T4 assay.  Please interpret results in light of the total clinical presentation of the patient.    Marland Kitchen TSH 10/01/2020 7.84* 0.35 - 4.50 uIU/mL Final  . Cholesterol 10/01/2020 101  0 - 200 mg/dL Final   ATP III Classification       Desirable:  < 200 mg/dL               Borderline High:  200 - 239 mg/dL          High:  > = 240 mg/dL  . Triglycerides 10/01/2020 118.0  0.0 - 149.0 mg/dL Final   Normal:  <150 mg/dLBorderline High:  150 - 199 mg/dL  . HDL 10/01/2020 36.90* >39.00 mg/dL Final  . VLDL 10/01/2020 23.6  0.0 - 40.0 mg/dL Final  . LDL Cholesterol 10/01/2020 40  0 - 99 mg/dL Final  . Total CHOL/HDL Ratio 10/01/2020 3   Final  Men          Women1/2 Average Risk     3.4          3.3Average Risk          5.0          4.42X Average Risk          9.6          7.13X Average Risk          15.0          11.0                       . NonHDL 10/01/2020 63.81   Final   NOTE:  Non-HDL goal should be 30 mg/dL higher than patient's LDL goal (i.e. LDL goal of < 70 mg/dL, would have non-HDL goal of < 100 mg/dL)  . Sodium 10/01/2020 139  135 - 145 mEq/L Final  . Potassium 10/01/2020 4.8  3.5 - 5.1 mEq/L Final  . Chloride 10/01/2020 103  96 - 112 mEq/L Final  . CO2 10/01/2020 29  19 - 32 mEq/L Final  . Glucose, Bld 10/01/2020 123* 70 - 99 mg/dL Final  . BUN 10/01/2020 14  6 - 23 mg/dL Final  . Creatinine, Ser 10/01/2020 0.80  0.40 - 1.20 mg/dL Final  . GFR 10/01/2020 76.79  >60.00 mL/min Final   Calculated using the CKD-EPI Creatinine Equation (2021)  . Calcium 10/01/2020 10.2  8.4 - 10.5 mg/dL Final  . Hgb A1c MFr Bld 10/01/2020 7.1* 4.6 - 6.5 % Final   Glycemic Control Guidelines for People with Diabetes:Non Diabetic:  <6%Goal of Therapy: <7%Additional Action Suggested:  >8%     Physical Examination:  BP 100/72   Pulse 72   Ht 5\' 2"  (1.575 m)   Wt 129 lb 6.4 oz (58.7 kg)   SpO2 99%   BMI 23.67 kg/m    Thyroid not palpable Biceps reflexes appear normal   ASSESSMENT:  Diabetes type 2, non-insulin requiring  See history of present illness for discussion of current diabetes management, blood sugar patterns and problems identified  A1c is still fairly good at 7.1 although previously was lower at 6.6  She is on Invokana, metformin 2 g daily and Precose  Her blood sugars are overall fairly good although not clear why her A1c is slightly higher Highest blood sugar recently is 190 after breakfast As before may have occasional high readings especially after breakfast when she is not getting enough protein  No recent fasting readings available  No side effects from Precose or Invokana  Hypothyroidism: Mild and supplemented with 25 mcg levothyroxine appears to be more hypothyroid despite taking this regularly She has some fatigue and hair loss   HYPERTENSION: Blood pressure is relatively low on  20 mg lisinopril  Microalbumin normal  PLAN:   Add protein at breakfast consistently Start regular walking No change in medications at this time but may consider reducing metformin if she continues to lose weight  Reduce lisinopril to 10 mg  Check blood pressure periodically at home  Increase levothyroxine to 50 mcg and follow-up in 2 months Discussed needing to take this before breakfast daily  Given list of PCPs to establish with for general care   There are no Patient Instructions on file for this visit.  Elayne Snare 10/03/2020, 10:17 AM    Note: This office note was prepared with Dragon voice recognition system technology. Any transcriptional errors that result from  this process are unintentional.

## 2020-10-19 ENCOUNTER — Other Ambulatory Visit: Payer: Self-pay | Admitting: Endocrinology

## 2020-11-14 ENCOUNTER — Other Ambulatory Visit: Payer: Self-pay | Admitting: Endocrinology

## 2020-11-22 ENCOUNTER — Ambulatory Visit: Payer: Medicaid Other | Admitting: Cardiology

## 2020-12-02 IMAGING — DX DG CHEST 2V
2 series · 2 of 2 positions shown · non-contrast
Comparison: 03/17/2020.  06/04/2013.

CLINICAL DATA: Shortness of breath.  HEIKJ-E9 positive.

EXAM:
CHEST - 2 VIEW

[chest pa]
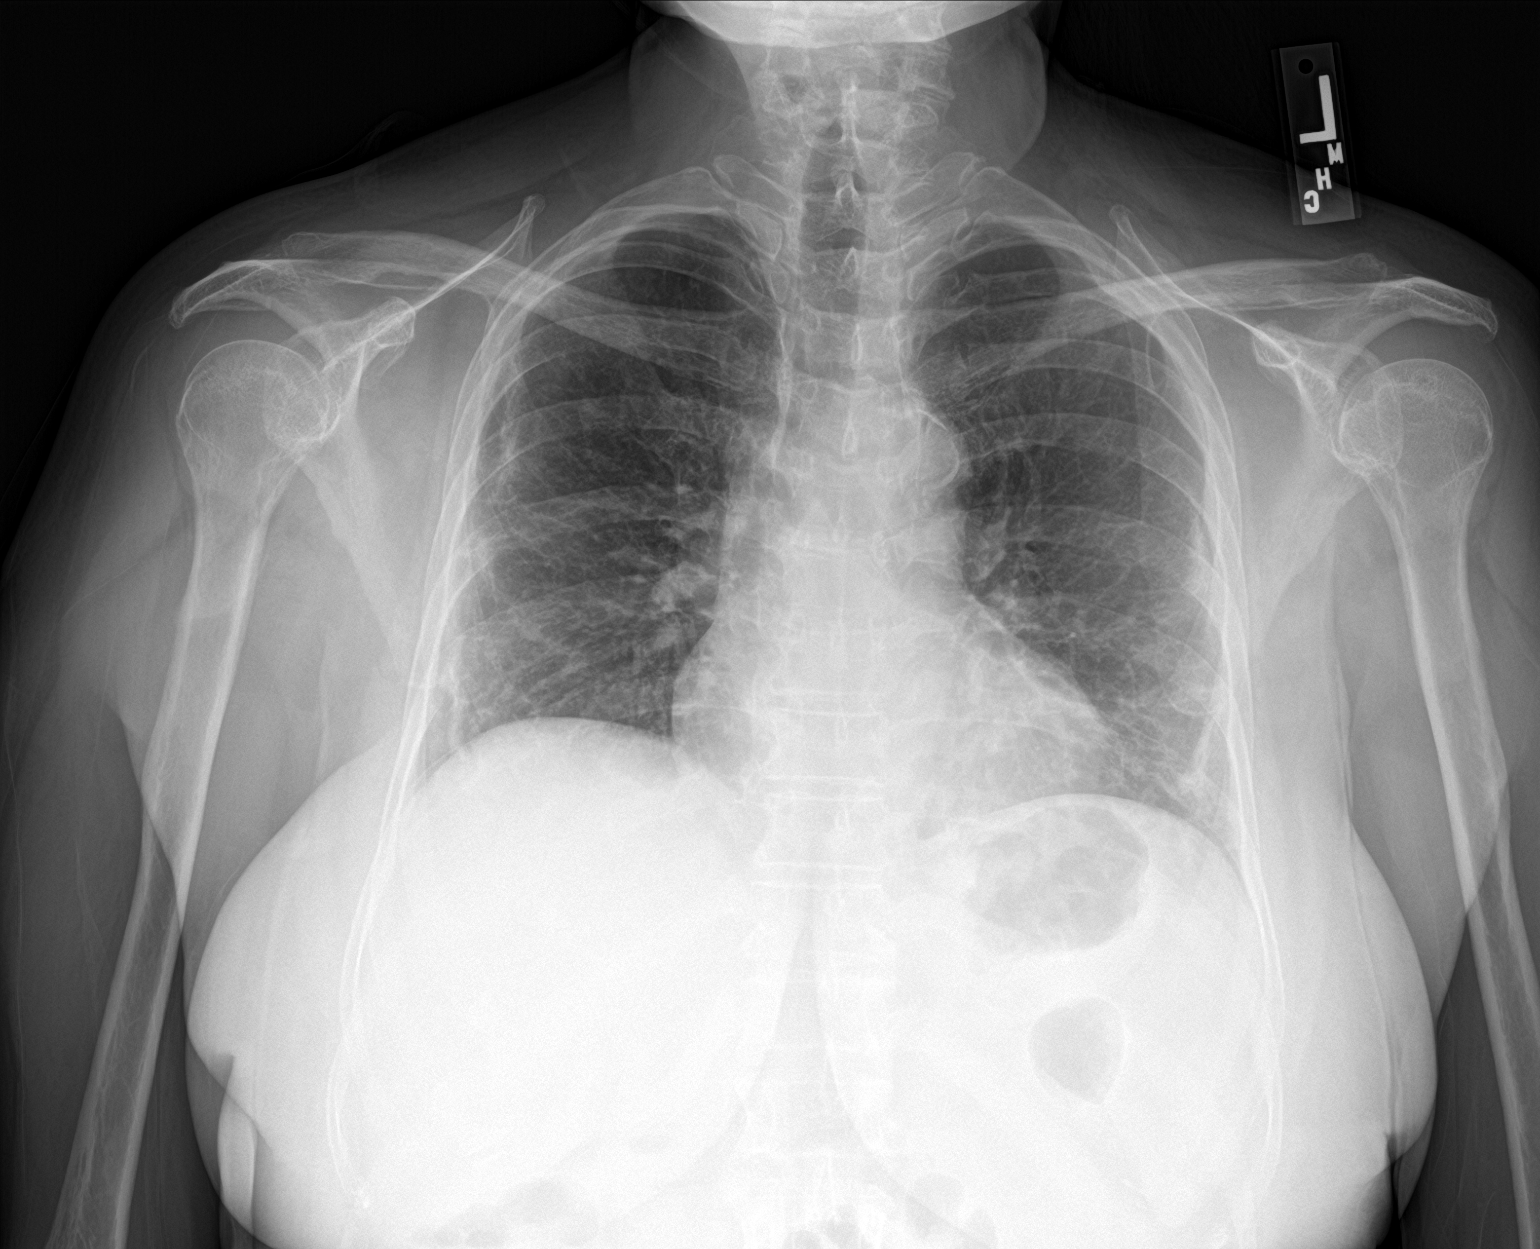

[chest lat]
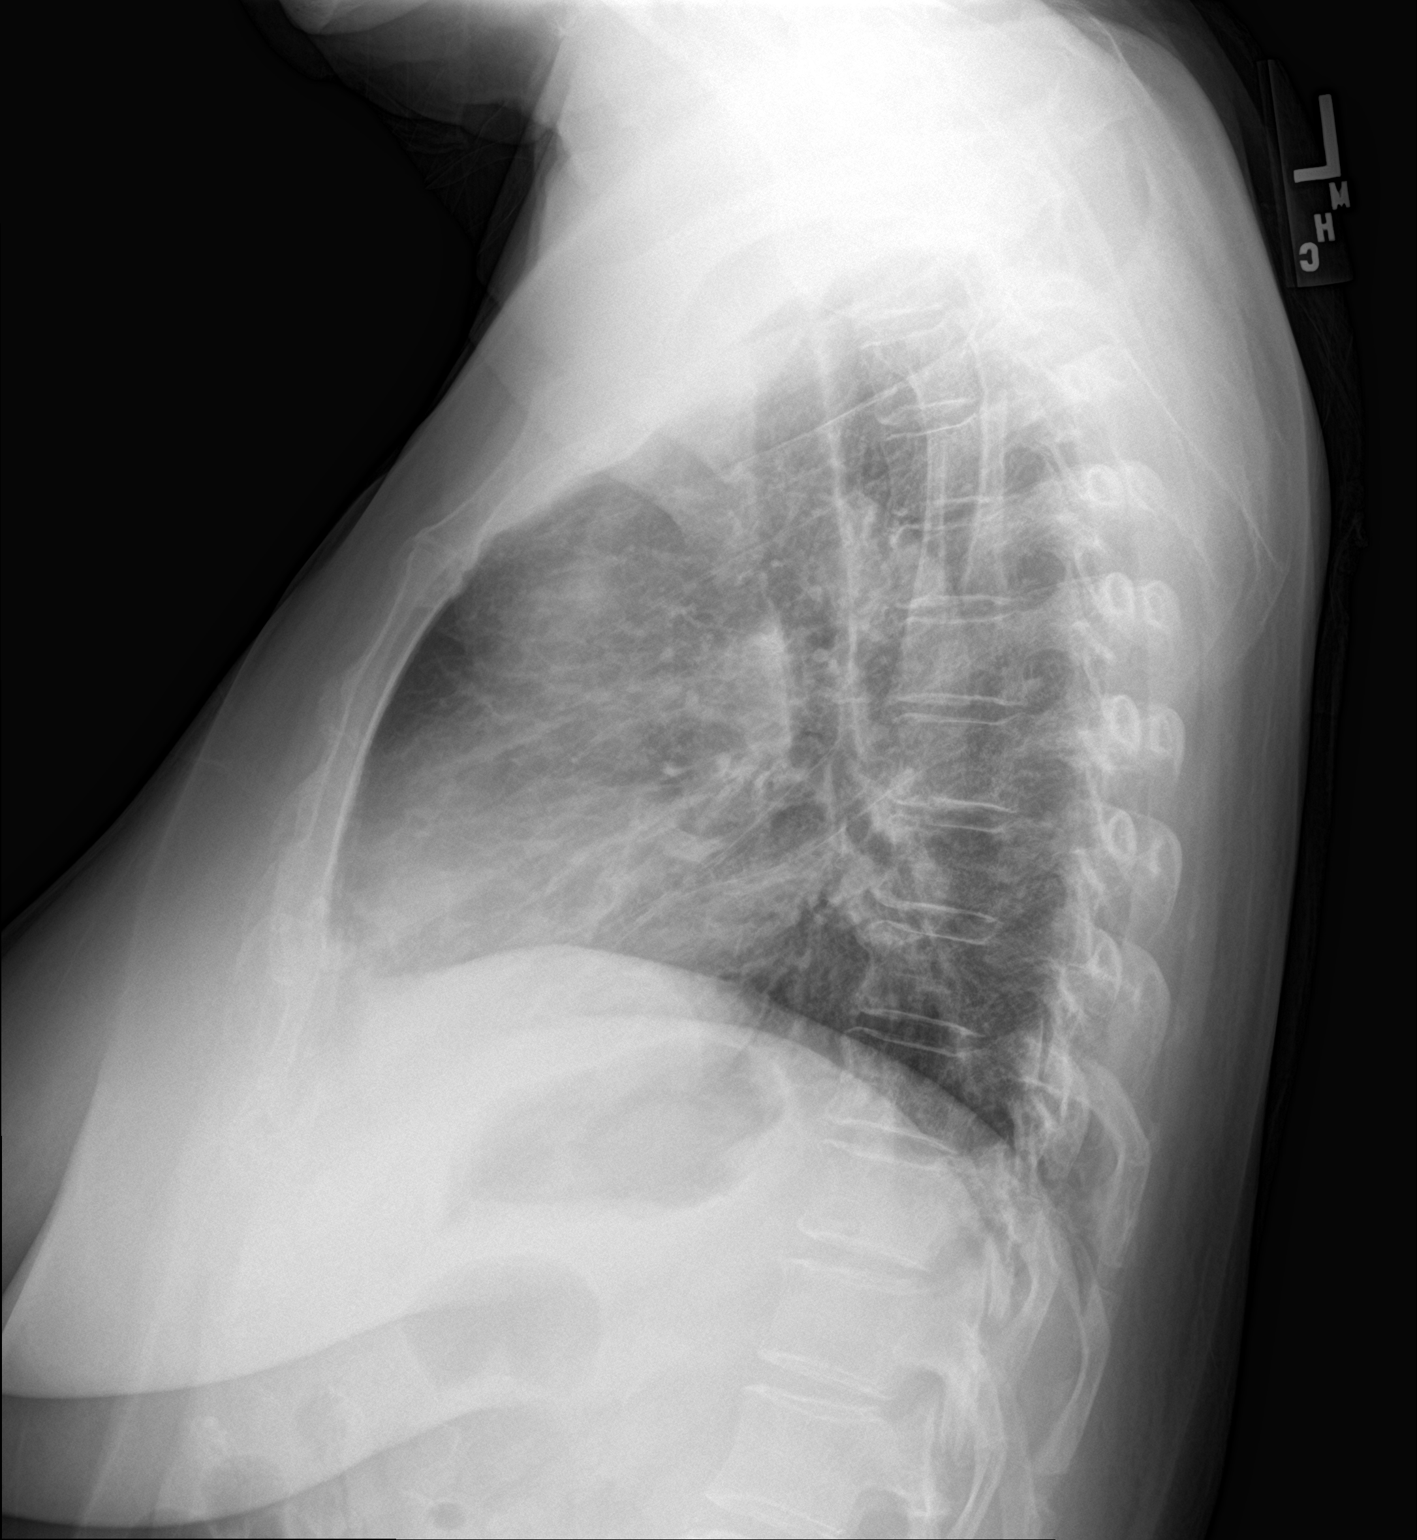

[2 of 2 positions shown; findings below may reference images not displayed]

FINDINGS: Midline trachea. Normal heart size. Atherosclerosis in the
transverse aorta. No pleural effusion or pneumothorax. Minimal
increase in basilar and peripheral predominant interstitial
opacities bilaterally.
IMPRESSION: Minimal increase in interstitial opacities which have a morphology
and distribution most consistent with HEIKJ-E9 pneumonia.

## 2020-12-05 ENCOUNTER — Other Ambulatory Visit (INDEPENDENT_AMBULATORY_CARE_PROVIDER_SITE_OTHER): Payer: Medicaid Other

## 2020-12-05 ENCOUNTER — Other Ambulatory Visit: Payer: Self-pay

## 2020-12-05 DIAGNOSIS — E039 Hypothyroidism, unspecified: Secondary | ICD-10-CM

## 2020-12-05 DIAGNOSIS — E1165 Type 2 diabetes mellitus with hyperglycemia: Secondary | ICD-10-CM

## 2020-12-05 LAB — BASIC METABOLIC PANEL
BUN: 8 mg/dL (ref 6–23)
CO2: 27 mEq/L (ref 19–32)
Calcium: 9.5 mg/dL (ref 8.4–10.5)
Chloride: 104 mEq/L (ref 96–112)
Creatinine, Ser: 0.8 mg/dL (ref 0.40–1.20)
GFR: 76.7 mL/min (ref >60.00)
Glucose, Bld: 128 mg/dL — ABNORMAL HIGH (ref 70–99)
Potassium: 4 mEq/L (ref 3.5–5.1)
Sodium: 139 mEq/L (ref 135–145)

## 2020-12-05 LAB — TSH: TSH: 2.87 u[IU]/mL (ref 0.35–4.50)

## 2020-12-05 LAB — HEMOGLOBIN A1C: Hgb A1c MFr Bld: 7.4 % — ABNORMAL HIGH (ref 4.6–6.5)

## 2020-12-07 ENCOUNTER — Ambulatory Visit: Payer: Medicaid Other | Admitting: Endocrinology

## 2020-12-07 ENCOUNTER — Encounter: Payer: Self-pay | Admitting: Endocrinology

## 2020-12-07 ENCOUNTER — Other Ambulatory Visit: Payer: Self-pay

## 2020-12-07 VITALS — BP 118/68 | HR 64 | Ht 59.5 in | Wt 130.8 lb

## 2020-12-07 DIAGNOSIS — I10 Essential (primary) hypertension: Secondary | ICD-10-CM

## 2020-12-07 DIAGNOSIS — E1165 Type 2 diabetes mellitus with hyperglycemia: Secondary | ICD-10-CM | POA: Diagnosis not present

## 2020-12-07 DIAGNOSIS — E063 Autoimmune thyroiditis: Secondary | ICD-10-CM

## 2020-12-07 NOTE — Progress Notes (Signed)
Patient ID: [name] [name], female   DOB: [birth year], 65 y.o.   MRN: [id number]           Reason for Appointment: Follow-up    History of Present Illness:          Diagnosis: Type 2 diabetes mellitus, date of diagnosis: ?  2014   Past history:   Her son thinks that she may have had diabetes diagnosed when she was in [place] When she moved to [facility] in 2014 she was not on any medications She was however found to have significant hyperglycemia with glucose 364 and A1c of 11.7 when she was admitted for her coronary artery disease in 04/2013. At that time she was started on premixed insulin twice a day Her level of control had not been adequate with persistently high A1c readings She did not tolerate Victoza well and was having decreased appetite, malaise and nausea and this was stopped subsequently  Recent history:        Oral hypoglycemic drugs:     Metformin ER 500 mg 4 tablets daily, Invokana 100 mg daily, Precose 25 mg before meals   Her A1c has gone up progressively to 7.4, has been as low as 6.6 last year   Current blood sugar patterns and problems: She is checking her blood sugars fairly consistently at various times but only on average less than once a day Blood sugars are not much higher than before overall Highest blood sugar was only 1 time 184, otherwise postprandial readings are generally up to about 165 Fasting lab glucose was 128 Previously had decreased appetite after COVID infection but now is eating normally Not clear why her A1c is relatively higher again Diet has not changed although she likely does not get enough protein with every meal She has had some walking but limited by sciatica-like pain on the right side   Side effects from medications have been: None  Self-care: The diet that the patient has been following is: None, has vegetarian diet Usually having vegetables and roti with lunch and dinner and sometimes having lentils.   She does not  like yogurt as it apparently causes heartburn  Usually not eating fried food                  Dietician visit, none   Glucose monitoring:  done up to 2 times a day       Glucometer: Accu-Chek  Blood Glucose readings by meter download and average readings as follows:   PRE-MEAL Fasting Lunch Dinner Bedtime Overall  Glucose range:     118-184  Mean/median: 125    141/137   POST-MEAL PC Breakfast PC Lunch PC Dinner  Glucose range:     Mean/median: 156 134 151   Previously:  PRE-MEAL Fasting Lunch Dinner Bedtime Overall  Glucose range:  127    .  88-190  Mean/median:     138   POST-MEAL PC Breakfast PC Lunch PC Dinner  Glucose range:  135-190    Mean/median:  156  122  139      Weight history:   Wt Readings from Last 3 Encounters:  12/07/20 130 lb 12.8 oz (59.3 kg)  10/03/20 129 lb 6.4 oz (58.7 kg)  05/31/20 132 lb 3.2 oz (60 kg)    Glycemic control:   Lab Results  Component Value Date   HGBA1C 7.4 (H) 12/05/2020   HGBA1C 7.1 (H) 10/01/2020   HGBA1C 6.6 (H) 05/25/2020   Lab Results  Component  Value Date   MICROALBUR 1.6 05/25/2020   LDLCALC 40 10/01/2020   CREATININE 0.80 12/05/2020    Lab on 12/05/2020  Component Date Value Ref Range Status   TSH 12/05/2020 2.87  0.35 - 4.50 uIU/mL Final   Sodium 12/05/2020 139  135 - 145 mEq/L Final   Potassium 12/05/2020 4.0  3.5 - 5.1 mEq/L Final   Chloride 12/05/2020 104  96 - 112 mEq/L Final   CO2 12/05/2020 27  19 - 32 mEq/L Final   Glucose, Bld 12/05/2020 128 (A) 70 - 99 mg/dL Final   BUN 12/05/2020 8  6 - 23 mg/dL Final   Creatinine, Ser 12/05/2020 0.80  0.40 - 1.20 mg/dL Final   GFR 12/05/2020 76.70  >60.00 mL/min Final   Calculated using the CKD-EPI Creatinine Equation (2021)   Calcium 12/05/2020 9.5  8.4 - 10.5 mg/dL Final   Hgb A1c MFr Bld 12/05/2020 7.4 (A) 4.6 - 6.5 % Final   Glycemic Control Guidelines for People with Diabetes:Non Diabetic:  <6%Goal of Therapy: <7%Additional Action Suggested:  >8%        Allergies as of 12/07/2020   No Known Allergies      Medication List        Accurate as of December 07, 2020  9:06 AM. If you have any questions, ask your nurse or doctor.          acarbose 25 MG tablet Commonly known as: PRECOSE TAKE 1 TABLET BY MOUTH AT BREAKFAST, THEN TAKE 2 TABLETS BY MOUTH AT [facility] AND 1 TABLET BY MOUTH AT DINNER   Accu-Chek FastClix Lancets Misc USE TO CHECK BLOOD SUGAR 3 TIMES PER DAY   Accu-Chek SmartView test strip Generic drug: glucose blood USE THREE TIMES PER DAY TO CHECK BLOOD SUGAR   aspirin EC 81 MG tablet Take 1 tablet (81 mg total) by mouth daily.   atorvastatin 80 MG tablet Commonly known as: LIPITOR Take 1 tablet (80 mg total) by mouth daily at 6 PM.   gabapentin 100 MG capsule Commonly known as: NEURONTIN TAKE 2 CAPSULES (200 MG TOTAL) BY MOUTH AT BEDTIME.   Invokana 100 MG Tabs tablet Generic drug: canagliflozin TAKE 1 TABLET (100 MG TOTAL) BY MOUTH DAILY.   levothyroxine 50 MCG tablet Commonly known as: SYNTHROID Take 1 tablet (50 mcg total) by mouth daily.   lisinopril 20 MG tablet Commonly known as: ZESTRIL Take 1 tablet (20 mg total) by mouth daily.   metFORMIN 500 MG 24 hr tablet Commonly known as: GLUCOPHAGE-XR TAKE 4 TABLETS BY MOUTH DAILY WITH SUPPER   metoprolol tartrate 25 MG tablet Commonly known as: LOPRESSOR Take 0.5 tablets (12.5 mg total) by mouth 2 (two) times daily.   nitroGLYCERIN 0.4 MG SL tablet Commonly known as: NITROSTAT Place 1 tablet (0.4 mg total) under the tongue every 5 (five) minutes as needed for chest pain.   ondansetron 4 MG disintegrating tablet Commonly known as: Zofran ODT Take 1 tablet (4 mg total) by mouth every 8 (eight) hours as needed for nausea or vomiting.   pantoprazole 40 MG tablet Commonly known as: PROTONIX Take 1 tablet (40 mg total) by mouth daily.        Allergies: No Known Allergies  Past Medical History:  Diagnosis Date   Anemia    CAD (coronary  artery disease)    a. 04/2013 Inf STEMI/PCI: LM nl, LAD min irregs, D1/2/3 nl, LCX 29m (2.5x18 Xience DES), OM1/2/3 nl, RCA nl, PDA nl, RPAV nl, RPL nl, EF 55%;  b. 04/2013 Echo: EF 55-60%, no rwma, mild MR, mildly dil LA.   Diabetes mellitus type 2, uncontrolled ([place])    a. 04/2013 HbA1c = 11.7.   GERD (gastroesophageal reflux disease)    Hyperlipidemia    Hypertension    Hypothyroidism     Past Surgical History:  Procedure Laterality Date   CESAREAN SECTION     LEFT HEART CATH  05-15-13   LEFT HEART CATHETERIZATION WITH CORONARY ANGIOGRAM N/A 05/15/2013   Procedure: LEFT HEART CATHETERIZATION WITH CORONARY ANGIOGRAM;  Surgeon: Wellington Hampshire, MD;  Location: [facility] CATH LAB;  Service: Cardiovascular;  Laterality: N/A;   PERCUTANEOUS STENT INTERVENTION  05/15/2013   Procedure: PERCUTANEOUS STENT INTERVENTION;  Surgeon: Wellington Hampshire, MD;  Location: [facility] CATH LAB;  Service: Cardiovascular;;    Family History  Problem Relation Age of Onset   Heart disease Mother    Diabetes Mother    Hypertension Mother    Heart disease Father    Diabetes Father    Hypertension Father    Diabetes Brother     Social History:  reports that she has never smoked. She has never used smokeless tobacco. She reports that she does not drink alcohol and does not use drugs.    Review of Systems   HYPOTHYROIDISM: Screening TSH had been done because of her complaints of carpal tunnel syndrome and some fatigue Because of her high TSH she has been on levothyroxine 25 mcg daily He takes this regularly before breakfast and has not missed any doses lately  In 4/22 was having symptoms of hair loss and also complaining of some fatigue Now taking 50 mcg levothyroxine with subjective improvement and normalization of TSH  No history of goiter  Last TSH:  Lab Results  Component Value Date   TSH 2.87 12/05/2020   TSH 7.84 (H) 10/01/2020   TSH 3.30 01/30/2020   FREET4 0.93 10/01/2020   FREET4 0.96 06/20/2019    FREET4 0.68 09/03/2017     NEUROPATHY: She has  symptoms of numbness and some tingling in her hands and usually when waking up Has only minimal pains in her feet with some burning She is now complaining about the numbness in her hands and tingling during the night despite taking gabapentin  Symptoms are improved with gabapentin  Lipids: She is treated with Lipitor 80 mg for several years Lipids are well controlled, has history of CAD followed by cardiologist      Lab Results  Component Value Date   CHOL 101 10/01/2020   HDL 36.90 (L) 10/01/2020   LDLCALC 40 10/01/2020   TRIG 118.0 10/01/2020   CHOLHDL 3 10/01/2020                 HYPERTENSION   The blood pressure has been controlled with lisinopril 10 mg  She thinks that occasionally her blood pressure may be around 664 systolic but lower in the morning Blood pressure is quite normal today in the office and was low normal when lisinopril was reduced in April   BP Readings from Last 3 Encounters:  12/07/20 120/64  10/03/20 104/68  05/31/20 122/82   She had a screening B12 level done because of neuropathic symptoms and long-term Metformin Had a low level at baseline and has been supplemented  Lab Results  Component Value Date   VITAMINB12 >1500 (H) 09/26/2019    No history of anemia  Lab Results  Component Value Date   HGB 13.4 03/21/2020    She had the  J&J Covid vaccine in April 21 and booster in 12/21  LABS:  Lab on 12/05/2020  Component Date Value Ref Range Status   TSH 12/05/2020 2.87  0.35 - 4.50 uIU/mL Final   Sodium 12/05/2020 139  135 - 145 mEq/L Final   Potassium 12/05/2020 4.0  3.5 - 5.1 mEq/L Final   Chloride 12/05/2020 104  96 - 112 mEq/L Final   CO2 12/05/2020 27  19 - 32 mEq/L Final   Glucose, Bld 12/05/2020 128 (A) 70 - 99 mg/dL Final   BUN 12/05/2020 8  6 - 23 mg/dL Final   Creatinine, Ser 12/05/2020 0.80  0.40 - 1.20 mg/dL Final   GFR 12/05/2020 76.70  >60.00 mL/min Final   Calculated  using the CKD-EPI Creatinine Equation (2021)   Calcium 12/05/2020 9.5  8.4 - 10.5 mg/dL Final   Hgb A1c MFr Bld 12/05/2020 7.4 (A) 4.6 - 6.5 % Final   Glycemic Control Guidelines for People with Diabetes:Non Diabetic:  <6%Goal of Therapy: <7%Additional Action Suggested:  >8%     Physical Examination:  BP 120/64   Pulse 64   Ht 4' 11.5" (1.511 m)   Wt 130 lb 12.8 oz (59.3 kg)   SpO2 99%   BMI 25.98 kg/m      ASSESSMENT:  Diabetes type 2, non-insulin requiring  See history of present illness for discussion of current diabetes management, blood sugar patterns and problems identified  A1c is 7.4 and progressively higher  She is on Invokana 100 mg, metformin 2 g daily and Precose  Her blood sugars are overall fairly good although slightly higher than last year This may be from her eating back to normal, previously had decreased appetite post COVID Her blood sugars are averaging only about 140 with postprandial average not more than 160 Likely has higher postprandial readings overall causing the increase in A1c Fasting readings are generally controlled  No side effects from Precose or Invokana which she takes regularly  Hypothyroidism: Has subjectively done better with increasing levothyroxine 50 mcg down TSH is back to normal    HYPERTENSION: Blood pressure is relatively low on 20 mg lisinopril  Microalbumin normal  PLAN:   Increase Invokana to 200 mg and when she runs out she can go up to 300 mg To call if blood sugars are consistently high  She likely needs a new blood pressure monitor as her meter reads unusually high readings at times She will discuss her issues with sciatica pain with PCP  Continue 10 mg lisinopril  Also will continue 50 mcg of levothyroxine  Given name of PCPs to establish with for general care   There are no Patient Instructions on file for this visit.  Elayne Snare 12/07/2020, 9:06 AM    Note: This office note was prepared with Dragon  voice recognition system technology. Any transcriptional errors that result from this process are unintentional.

## 2020-12-07 NOTE — Patient Instructions (Signed)
[facility]  Int Med Dr Sherrie Mustache 2 pills

## 2020-12-11 ENCOUNTER — Telehealth: Payer: Self-pay | Admitting: Endocrinology

## 2020-12-11 DIAGNOSIS — E1140 Type 2 diabetes mellitus with diabetic neuropathy, unspecified: Secondary | ICD-10-CM

## 2020-12-11 NOTE — Telephone Encounter (Signed)
Pharmacy called stating patient states her instructions changed with her invokana to taking 2 pills a day rather than one - pharmacy asks if we can call in the Rx to reflect the 2 pills a day  [place], [place] - [address]  [place], [place] [address]  Phone:  [phone]  Fax:  [phone]

## 2020-12-14 ENCOUNTER — Other Ambulatory Visit: Payer: Self-pay

## 2020-12-14 MED ORDER — CANAGLIFLOZIN 100 MG PO TABS
200.0000 mg | ORAL_TABLET | Freq: Every day | ORAL | 0 refills | Status: DC
Start: 2020-12-14 — End: 2021-04-09

## 2020-12-14 NOTE — Telephone Encounter (Signed)
New Rx was sent over

## 2020-12-25 NOTE — Progress Notes (Deleted)
Cardiology Office Note   Date:  12/25/2020   ID:  [name] [name], DOB 04/04/1956, MRN [id number]  PCP:  Pcp, No  Cardiologist:  Dr. Marlou Porch    No chief complaint on file.     History of Present Illness: [name] [name] is a 65 y.o. female who presents for ***  She has a history of diabetes, HTN, and CAD s/p DES to [facility] in the setting of inferior STEMI back in 04/2013.  EF was preserved. Other PMH includes HTN, T2 DM, hypothyroidism, anemia and HLD. She has been found to have anemia - ended up referring to GI and found to be iron deficient. No longer on Plavix. Last seen in June by me. Not getting out much due to the pandemic - some fatigue and various aches and pain but cardiac status felt to be ok. Family felt like she was doing well. She has been vaccinated for COVID 19.    Comes in today. Here with her son who augments the history. She is doing ok. She did have COVID back in October and was pretty sick - they had been to a function in [place] - probable exposure was there. She did have antibody infusion. Getting better. Has had her 3rd COVID vaccine as well. No chest pain. Not short of breath. Coughing resolved. Not really walking but planning to get back to walking in the house. He is trying to keep her safe and inside. She has had recent labs. No problems with the medicines.    No further plavix due to anemia.  BB and statin CONTINUES.    Past Medical History:  Diagnosis Date   Anemia    CAD (coronary artery disease)    a. 04/2013 Inf STEMI/PCI: LM nl, LAD min irregs, D1/2/3 nl, LCX 6m (2.5x18 Xience DES), OM1/2/3 nl, RCA nl, PDA nl, RPAV nl, RPL nl, EF 55%;  b. 04/2013 Echo: EF 55-60%, no rwma, mild MR, mildly dil LA.   Diabetes mellitus type 2, uncontrolled ([place])    a. 04/2013 HbA1c = 11.7.   GERD (gastroesophageal reflux disease)    Hyperlipidemia    Hypertension    Hypothyroidism     Past Surgical History:  Procedure Laterality Date   CESAREAN SECTION     LEFT  HEART CATH  05-15-13   LEFT HEART CATHETERIZATION WITH CORONARY ANGIOGRAM N/A 05/15/2013   Procedure: LEFT HEART CATHETERIZATION WITH CORONARY ANGIOGRAM;  Surgeon: Wellington Hampshire, MD;  Location: [facility] CATH LAB;  Service: Cardiovascular;  Laterality: N/A;   PERCUTANEOUS STENT INTERVENTION  05/15/2013   Procedure: PERCUTANEOUS STENT INTERVENTION;  Surgeon: Wellington Hampshire, MD;  Location: [facility] CATH LAB;  Service: Cardiovascular;;     Current Outpatient Medications  Medication Sig Dispense Refill   acarbose (PRECOSE) 25 MG tablet TAKE 1 TABLET BY MOUTH AT BREAKFAST, THEN TAKE 2 TABLETS BY MOUTH AT [facility] AND 1 TABLET BY MOUTH AT DINNER 120 tablet 1   Accu-Chek FastClix Lancets MISC USE TO CHECK BLOOD SUGAR 3 TIMES PER DAY 102 each 3   aspirin EC 81 MG tablet Take 1 tablet (81 mg total) by mouth daily.     atorvastatin (LIPITOR) 80 MG tablet Take 1 tablet (80 mg total) by mouth daily at 6 PM. 30 tablet 11   canagliflozin (INVOKANA) 100 MG TABS tablet Take 2 tablets (200 mg total) by mouth daily before breakfast. 30 tablet 0   gabapentin (NEURONTIN) 100 MG capsule TAKE 2 CAPSULES (200 MG TOTAL) BY MOUTH AT BEDTIME. 60 capsule  1   glucose blood (ACCU-CHEK SMARTVIEW) test strip USE THREE TIMES PER DAY TO CHECK BLOOD SUGAR 300 each PRN   levothyroxine (SYNTHROID) 50 MCG tablet Take 1 tablet (50 mcg total) by mouth daily. 90 tablet 3   lisinopril (ZESTRIL) 20 MG tablet Take 1 tablet (20 mg total) by mouth daily. 30 tablet 11   metFORMIN (GLUCOPHAGE-XR) 500 MG 24 hr tablet TAKE 4 TABLETS BY MOUTH DAILY WITH SUPPER 120 tablet 1   metoprolol tartrate (LOPRESSOR) 25 MG tablet Take 0.5 tablets (12.5 mg total) by mouth 2 (two) times daily. 60 tablet 11   nitroGLYCERIN (NITROSTAT) 0.4 MG SL tablet Place 1 tablet (0.4 mg total) under the tongue every 5 (five) minutes as needed for chest pain. 25 tablet 3   ondansetron (ZOFRAN ODT) 4 MG disintegrating tablet Take 1 tablet (4 mg total) by mouth every 8 (eight) hours  as needed for nausea or vomiting. (Patient not taking: No sig reported) 20 tablet 0   pantoprazole (PROTONIX) 40 MG tablet Take 1 tablet (40 mg total) by mouth daily. 30 tablet 11   No current facility-administered medications for this visit.    Allergies:   Patient has no known allergies.    Social History:  The patient  reports that she has never smoked. She has never used smokeless tobacco. She reports that she does not drink alcohol and does not use drugs.   Family History:  The patient's ***family history includes Diabetes in her brother, father, and mother; Heart disease in her father and mother; Hypertension in her father and mother.    ROS:  General:no colds or fevers, no weight changes Skin:no rashes or ulcers HEENT:no blurred vision, no congestion CV:see HPI PUL:see HPI GI:no diarrhea constipation or melena, no indigestion GU:no hematuria, no dysuria MS:no joint pain, no claudication Neuro:no syncope, no lightheadedness Endo:no diabetes, no thyroid disease Wt Readings from Last 3 Encounters:  12/07/20 130 lb 12.8 oz (59.3 kg)  10/03/20 129 lb 6.4 oz (58.7 kg)  05/31/20 132 lb 3.2 oz (60 kg)     PHYSICAL EXAM: VS:  There were no vitals taken for this visit. , BMI There is no height or weight on file to calculate BMI. General:Pleasant affect, NAD Skin:Warm and dry, brisk capillary refill HEENT:normocephalic, sclera clear, mucus membranes moist Neck:supple, no JVD, no bruits  Heart:S1S2 RRR without murmur, gallup, rub or click Lungs:clear without rales, rhonchi, or wheezes ZOX:WRUE, non tender, + BS, do not palpate liver spleen or masses Ext:no lower ext edema, 2+ pedal pulses, 2+ radial pulses Neuro:alert and oriented, MAE, follows commands, + facial symmetry    EKG:  EKG is ordered today. The ekg ordered today demonstrates ***   Recent Labs: 03/21/2020: ALT 17; Hemoglobin 13.4; Platelets 400 12/05/2020: BUN 8; Creatinine, Ser 0.80; Potassium 4.0; Sodium 139;  TSH 2.87    Lipid Panel    Component Value Date/Time   CHOL 101 10/01/2020 0831   CHOL 87 (L) 11/29/2019 0831   TRIG 118.0 10/01/2020 0831   HDL 36.90 (L) 10/01/2020 0831   HDL 30 (L) 11/29/2019 0831   CHOLHDL 3 10/01/2020 0831   VLDL 23.6 10/01/2020 0831   LDLCALC 40 10/01/2020 0831   LDLCALC 36 11/29/2019 0831       Other studies Reviewed: Additional studies/ records that were reviewed today include: ***.  Echo Study Conclusions from 2014  - Left ventricle: The cavity size was normal. Wall thickness   was increased in a pattern of mild LVH. Systolic function  was normal. The estimated ejection fraction was in the   range of 55% to 60%. Wall motion was normal; there were no   regional wall motion abnormalities. - Mitral valve: Mild regurgitation. - Left atrium: The atrium was mildly dilated. - Atrial septum: Redundant but no obvious PFO/ASD No defect   or patent foramen ovale was identified.     Coronary angiography 2014: Coronary dominance: Right   Left Main:  Normal Left Anterior Descending (LAD):  Normal in size with minor irregularities. 1st diagonal (D1):  Normal in size with no significant disease. 2nd diagonal (D2):  Medium in size with no significant disease. 3rd diagonal (D3):  Small in size with no significant disease. Circumflex (LCx):  Normal in size with minor irregularities proximally. There is a 99% hazy stenosis in the midsegment which is likely the culprit for inferior ST elevation MI. The stenosis is at the origin of OM 2. 1st obtuse marginal:  Small in size with minor irregularities. 2nd obtuse marginal:  Normal in size with minor irregularities. 3rd obtuse marginal:  Normal in size with no significant disease.      Right Coronary Artery: normal in size with no significant disease. Posterior descending artery: Normal Posterior AV segment: Small in size with no significant disease. Posterolateral branchs:  Small and normal posterolateral  branches.   Left ventriculography: Left ventricular systolic function is normal  , LVEF is estimated at 55 %, there is no significant mitral regurgitation   PCI Note:  Following the diagnostic procedure, the decision was made to proceed with PCI.  Weight-based bivalirudin was given for anticoagulation. Once a therapeutic ACT was achieved, a 6 [place] XB 3.0 guide catheter was inserted.  A intuition coronary guidewire was used to cross the lesion.  The lesion was predilated with a 2.5 x 12 balloon.  The lesion was then stented with a 2.5 x 18 mm Xience expedition drug-eluting stent. There was a significant vessel mismatch between the proximal and the distal segment of the stent. The stent was postdilated with a 3.0 x 12 noncompliant balloon.  Following PCI, there was 0% residual stenosis and TIMI-3 flow. Final angiography confirmed an excellent result. The patient tolerated the procedure well. There were no immediate procedural complications. A TR band was used for radial hemostasis. The patient was transferred to the post catheterization recovery area for further monitoring.   PCI Data: Vessel - left circumflex artery/Segment - mid Percent Stenosis (pre)  99% TIMI-flow 3 Stent : 2.5 x 18 mm Xience expedition drug-eluting stent postdilated with a 3.0 noncompliant balloon in the mid and proximal segment Percent Stenosis (post) 0% TIMI-flow (post) 3     Final Conclusions: 1. Severe one-vessel coronary artery disease with 99% hazy mid left circumflex stenosis which is the culprit for inferior ST elevation MI. 2. Normal LV systolic function and mildly elevated left ventricular end-diastolic pressure. 3. Successful angioplasty and drug-eluting stent placement to the left circumflex artery.   Recommendations: Continue dual antiplatelet therapy for at least one year. Aggressive treatment of risk factors is recommended.   ASSESSMENT AND PLAN:  1.  ***   Current medicines are reviewed with the  patient today.  The patient Has no concerns regarding medicines.  The following changes have been made:  See above Labs/ tests ordered today include:see above  Disposition:   FU:  see above  Signed, Cecilie Kicks, NP  12/25/2020 9:48 PM    [facility] [place], [place], [place]  [place], [place] Phone: [phone]; Fax: [phone]

## 2020-12-26 ENCOUNTER — Ambulatory Visit: Payer: Medicaid Other | Admitting: Cardiology

## 2021-01-10 ENCOUNTER — Other Ambulatory Visit: Payer: Self-pay | Admitting: Endocrinology

## 2021-01-10 ENCOUNTER — Other Ambulatory Visit (HOSPITAL_COMMUNITY): Payer: Self-pay

## 2021-01-10 NOTE — Telephone Encounter (Signed)
Per Inez Catalina Rx was filled at pharmacy earlier today. No PA needed.

## 2021-01-10 NOTE — Telephone Encounter (Signed)
Pharmacy called to let us know the insurance is not trying to cover pt's medications reflecting the new dosage change and so we will need a PA for pt's invokana 100 mg tablets for 60 tabs.

## 2021-01-23 ENCOUNTER — Encounter: Payer: Self-pay | Admitting: Cardiology

## 2021-01-23 ENCOUNTER — Ambulatory Visit: Payer: Medicaid Other | Admitting: Cardiology

## 2021-01-23 ENCOUNTER — Other Ambulatory Visit: Payer: Self-pay

## 2021-01-23 VITALS — BP 120/76 | HR 77 | Ht 59.0 in | Wt 130.6 lb

## 2021-01-23 DIAGNOSIS — D509 Iron deficiency anemia, unspecified: Secondary | ICD-10-CM | POA: Diagnosis not present

## 2021-01-23 DIAGNOSIS — I10 Essential (primary) hypertension: Secondary | ICD-10-CM

## 2021-01-23 DIAGNOSIS — E785 Hyperlipidemia, unspecified: Secondary | ICD-10-CM | POA: Diagnosis not present

## 2021-01-23 DIAGNOSIS — I2510 Atherosclerotic heart disease of native coronary artery without angina pectoris: Secondary | ICD-10-CM | POA: Diagnosis not present

## 2021-01-23 NOTE — Patient Instructions (Signed)
Medication Instructions:  Your physician recommends that you continue on your current medications as directed. Please refer to the Current Medication list given to you today.  *If you need a refill on your cardiac medications before your next appointment, please call your pharmacy*   Lab Work: None ordered  If you have labs (blood work) drawn today and your tests are completely normal, you will receive your results only by: [place] (if you have MyChart) OR A paper copy in the mail If you have any lab test that is abnormal or we need to change your treatment, we will call you to review the results.   Testing/Procedures: None ordered   Follow-Up: At [facility], you and your health needs are our priority.  As part of our continuing mission to provide you with exceptional heart care, we have created designated Provider Care Teams.  These Care Teams include your primary Cardiologist (physician) and Advanced Practice Providers (APPs -  Physician Assistants and Nurse Practitioners) who all work together to provide you with the care you need, when you need it.  We recommend signing up for the patient portal called "MyChart".  Sign up information is provided on this After Visit Summary.  MyChart is used to connect with patients for Virtual Visits (Telemedicine).  Patients are able to view lab/test results, encounter notes, upcoming appointments, etc.  Non-urgent messages can be sent to your provider as well.   To learn more about what you can do with MyChart, go to [email or web address].    Your next appointment:   6 month(s)  The format for your next appointment:   In Person  Provider:   You may see None or one of the following Advanced Practice Providers on your designated Care Team:   Cecilie Kicks, NP    Other Instructions

## 2021-01-23 NOTE — Progress Notes (Signed)
Cardiology Office Note   Date:  01/24/2021   ID:  [name] [name], DOB 09-16-1955, MRN [id number]  PCP:  Pcp, No  Cardiologist:  Dr. Marlou Porch     Chief Complaint  Patient presents with   Coronary Artery Disease      History of Present Illness: [name] [name] is a 65 y.o. female who presents for CAD  SHe has history of diabetes, HTN, and CAD s/p DES to [facility] in the setting of inferior STEMI back in 04/2013.  EF was preserved. Other PMH includes HTN, T2 DM, hypothyroidism, anemia and HLD. She has been found to have anemia - ended up referring to GI and found to be iron deficient. No longer on Plavix. Last seen in June by me. Not getting out much due to the pandemic - some fatigue and various aches and pain but cardiac status felt to be ok. Family felt like she was doing well. She has been vaccinated for COVID 19.    Last visit with her son who augments the history. She did have COVID back in October and was pretty sick - they had been to a function in [place] - probable exposure was there. She did have antibody infusion. Getting better. Has had her 3rd COVID vaccine as well. . Not really walking but planning to get back to walking in the house. He is trying to keep her safe and inside.  Doing well today. Her son is with her today to help translate. No chest pain or SOB -she recovered from [place] and has had her boosters and is planning to go to [place] for a month.  She is walking today. Eating healthy.  Past Medical History:  Diagnosis Date   Anemia    CAD (coronary artery disease)    a. 04/2013 Inf STEMI/PCI: LM nl, LAD min irregs, D1/2/3 nl, LCX 79m(2.5x18 Xience DES), OM1/2/3 nl, RCA nl, PDA nl, RPAV nl, RPL nl, EF 55%;  b. 04/2013 Echo: EF 55-60%, no rwma, mild MR, mildly dil LA.   Diabetes mellitus type 2, uncontrolled ([place]    a. 04/2013 HbA1c = 11.7.   GERD (gastroesophageal reflux disease)    Hyperlipidemia    Hypertension    Hypothyroidism     Past Surgical History:   Procedure Laterality Date   CESAREAN SECTION     LEFT HEART CATH  05-15-13   LEFT HEART CATHETERIZATION WITH CORONARY ANGIOGRAM N/A 05/15/2013   Procedure: LEFT HEART CATHETERIZATION WITH CORONARY ANGIOGRAM;  Surgeon: MWellington Hampshire MD;  [facility];  Service: Cardiovascular;  Laterality: N/A;   PERCUTANEOUS STENT INTERVENTION  05/15/2013   Procedure: PERCUTANEOUS STENT INTERVENTION;  Surgeon: MWellington Hampshire MD;  [facility];  Service: Cardiovascular;;     Current Outpatient Medications  Medication Sig Dispense Refill   acarbose (PRECOSE) 25 MG tablet TAKE 1 TABLET BY MOUTH AT BREAKFAST, THEN TAKE 2 TABLE[facility]TH [facility] BY [facility]OU[facility] AT DINNER 120 tablet 1   Accu-Chek FastClix Lancets MISC USE TO CHECK BLOOD SUGAR 3 TIMES PER DAY 102 each 3   aspirin EC 81 MG tablet Take 1 tablet (81 mg total) by mouth daily.     atorvastatin (LIPITOR) 80 MG tablet Take 1 tablet (80 mg total) by mouth daily at 6 PM. 30 tablet 11   canagliflozin (INVOKANA) 100 MG TABS tablet Take 2 tablets (200 mg total) by mouth daily before breakfast. 30 tablet 0   gabapentin (NEURONTIN) 100 MG capsule TAKE  2 CAPSULES (200 MG TOTAL) BY MOUTH AT BEDTIME. 60 capsule 1   glucose blood (ACCU-CHEK SMARTVIEW) test strip USE THREE TIMES PER DAY TO CHECK BLOOD SUGAR 300 each PRN   levothyroxine (SYNTHROID) 50 MCG tablet Take 1 tablet (50 mcg total) by mouth daily. 90 tablet 3   lisinopril (ZESTRIL) 20 MG tablet Take 1 tablet (20 mg total) by mouth daily. 30 tablet 11   metFORMIN (GLUCOPHAGE-XR) 500 MG 24 hr tablet TAKE 4 TABLETS BY MOUTH DAILY WITH SUPPER 120 tablet 1   metoprolol tartrate (LOPRESSOR) 25 MG tablet Take 0.5 tablets (12.5 mg total) by mouth 2 (two) times daily. 60 tablet 11   nitroGLYCERIN (NITROSTAT) 0.4 MG SL tablet Place 1 tablet (0.4 mg total) under the tongue every 5 (five) minutes as needed for chest pain. 25 tablet 3   ondansetron (ZOFRAN ODT) 4 MG disintegrating tablet  Take 1 tablet (4 mg total) by mouth every 8 (eight) hours as needed for nausea or vomiting. 20 tablet 0   pantoprazole (PROTONIX) 40 MG tablet Take 1 tablet (40 mg total) by mouth daily. 30 tablet 11   No current facility-administered medications for this visit.    Allergies:   Patient has no known allergies.    Social History:  The patient  reports that she has never smoked. She has never used smokeless tobacco. She reports that she does not drink alcohol and does not use drugs.   Family History:  The patient's family history includes Diabetes in her brother, father, and mother; Heart disease in her father and mother; Hypertension in her father and mother.    ROS:  General:no colds or fevers, no weight changes Skin:no rashes or ulcers HEENT:no blurred vision, no congestion CV:see HPI PUL:see HPI GI:no diarrhea constipation or melena, no indigestion GU:no hematuria, no dysuria MS:no joint pain, no claudication Neuro:no syncope, no lightheadedness Endo:+ diabetes, + thyroid disease  Wt Readings from Last 3 Encounters:  01/23/21 130 lb 9.6 oz (59.2 kg)  12/07/20 130 lb 12.8 oz (59.3 kg)  10/03/20 129 lb 6.4 oz (58.7 kg)     PHYSICAL EXAM: VS:  BP 120/76   Pulse 77   Ht '4\' 11"'$  (1.499 m)   Wt 130 lb 9.6 oz (59.2 kg)   SpO2 98%   BMI 26.38 kg/m  , BMI Body mass index is 26.38 kg/m. General:Pleasant affect, NAD Skin:Warm and dry, brisk capillary refill HEENT:normocephalic, sclera clear, mucus membranes moist Neck:supple, no JVD, no bruits  Heart:S1S2 RRR without murmur, gallup, rub or click Lungs:clear without rales, rhonchi, or wheezes [id number], non tender, + BS, do not palpate liver spleen or masses Ext:no lower ext edema, 2+ pedal pulses, 2+ radial pulses Neuro:alert and oriented X3, MAE, follows commands, + facial symmetry    EKG:  EKG is NOT ordered today.  Recent Labs: 03/21/2020: ALT 17; Hemoglobin 13.4; Platelets 400 12/05/2020: BUN 8; Creatinine, Ser 0.80;  Potassium 4.0; Sodium 139; TSH 2.87    Lipid Panel    Component Value Date/Time   CHOL 101 10/01/2020 0831   CHOL 87 (L) 11/29/2019 0831   TRIG 118.0 10/01/2020 0831   HDL 36.90 (L) 10/01/2020 0831   HDL 30 (L) 11/29/2019 0831   CHOLHDL 3 10/01/2020 0831   VLDL 23.6 10/01/2020 0831   LDLCALC 40 10/01/2020 0831   LDLCALC 36 11/29/2019 0831       Other studies Reviewed: Additional studies/ records that were reviewed today include: Echo 05/15/13. Study Conclusions   - Left ventricle: The  cavity size was normal. Wall thickness    was increased in a pattern of mild LVH. Systolic function    was normal. The estimated ejection fraction was in the    range of 55% to 60%. Wall motion was normal; there were no    regional wall motion abnormalities.  - Mitral valve: Mild regurgitation.  - Left atrium: The atrium was mildly dilated.  - Atrial septum: Redundant but no obvious PFO/ASD No defect    or patent foramen ovale was identified.  Transthoracic echocardiography.  M-mode, complete 2D,  spectral Doppler, and color Doppler.  Height:  Height:  147.3cm. Height: 58in.  Weight:  Weight: 70kg. Weight:  154lb.  Body mass index:  BMI: 32.3kg/m^2.  Body surface  area:    BSA: 1.54m2.  Blood pressure:     124/75.  Patient  status:  Inpatient.  Location:  Bedside.   ------------------------------------------------------------   ------------------------------------------------------------  Left ventricle:  The cavity size was normal. Wall thickness  was increased in a pattern of mild LVH. Systolic function  was normal. The estimated ejection fraction was in the range  of 55% to 60%. Wall motion was normal; there were no  regional wall motion abnormalities.   ------------------------------------------------------------  Aortic valve:   Trileaflet; normal thickness, mildly  calcified leaflets. Mobility was not restricted.  Doppler:  Transvalvular velocity was within the normal range.  There  was no stenosis.  No regurgitation.   ------------------------------------------------------------  Aorta:  Aortic root: The aortic root was normal in size.   ------------------------------------------------------------  Mitral valve:   Mildly thickened leaflets . Mobility was not  restricted.  Doppler:  Transvalvular velocity was within the  normal range. There was no evidence for stenosis.  Mild  regurgitation.    Peak gradient: 365mHg (D).   ------------------------------------------------------------  Left atrium:  The atrium was mildly dilated.   ------------------------------------------------------------  Atrial septum:  Redundant but no obvious PFO/ASD No defect  or patent foramen ovale was identified.   ------------------------------------------------------------  Right ventricle:  The cavity size was normal. Wall thickness  was normal. Systolic function was normal.   ------------------------------------------------------------  Pulmonic valve:    Doppler:  Transvalvular velocity was  within the normal range. There was no evidence for stenosis.   Trivial regurgitation.   ------------------------------------------------------------  Tricuspid valve:   Structurally normal valve.    Doppler:  Transvalvular velocity was within the normal range.  Mild  regurgitation.   ------------------------------------------------------------  Pulmonary artery:   The main pulmonary artery was  normal-sized. Systolic pressure was within the normal range.     ------------------------------------------------------------  Right atrium:  The atrium was normal in size.   ------------------------------------------------------------  Pericardium:  The pericardium was normal in appearance.  There was no pericardial effusion.   Coronary angiography 2014: Coronary dominance: Right   Left Main:  Normal  Left Anterior Descending (LAD):  Normal in size with minor irregularities. 1st  diagonal (D1):  Normal in size with no significant disease. 2nd diagonal (D2):  Medium in size with no significant disease. 3rd diagonal (D3):  Small in size with no significant disease. Circumflex (LCx):  Normal in size with minor irregularities proximally. There is a 99% hazy stenosis in the midsegment which is likely the culprit for inferior ST elevation MI. The stenosis is at the origin of OM 2. 1st obtuse marginal:  Small in size with minor irregularities. 2nd obtuse marginal:  Normal in size with minor irregularities. 3rd obtuse marginal:  Normal in size with no significant  disease.      Right Coronary Artery: normal in size with no significant disease. Posterior descending artery: Normal Posterior AV segment: Small in size with no significant disease. Posterolateral branchs:  Small and normal posterolateral branches.   Left ventriculography: Left ventricular systolic function is normal  , LVEF is estimated at 55 %, there is no significant mitral regurgitation    PCI Note:  Following the diagnostic procedure, the decision was made to proceed with PCI.  Weight-based bivalirudin was given for anticoagulation. Once a therapeutic ACT was achieved, a 6 [place] XB 3.0 guide catheter was inserted.  A intuition coronary guidewire was used to cross the lesion.  The lesion was predilated with a 2.5 x 12 balloon.  The lesion was then stented with a 2.5 x 18 mm Xience expedition drug-eluting stent. There was a significant vessel mismatch between the proximal and the distal segment of the stent. The stent was postdilated with a 3.0 x 12 noncompliant balloon.  Following PCI, there was 0% residual stenosis and TIMI-3 flow. Final angiography confirmed an excellent result. The patient tolerated the procedure well. There were no immediate procedural complications. A TR band was used for radial hemostasis. The patient was transferred to the post catheterization recovery area for further monitoring.   PCI  Data: Vessel - left circumflex artery/Segment - mid Percent Stenosis (pre)  99% TIMI-flow 3 Stent : 2.5 x 18 mm Xience expedition drug-eluting stent postdilated with a 3.0 noncompliant balloon in the mid and proximal segment Percent Stenosis (post) 0% TIMI-flow (post) 3     Final Conclusions:  1. Severe one-vessel coronary artery disease with 99% hazy mid left circumflex stenosis which is the culprit for inferior ST elevation MI. 2. Normal LV systolic function and mildly elevated left ventricular end-diastolic pressure. 3. Successful angioplasty and drug-eluting stent placement to the left circumflex artery.   Recommendations:  Continue dual antiplatelet therapy for at least one year. Aggressive treatment of risk factors is recommended.   ASSESSMENT AND PLAN:  1.  CAD stable today with no chest pain, no SOB with hx inf MI in 2014 and DES to LCX. Normal EF now on ASA along due to hx of anemia. On statin and BB  2. HTN controlled, no med changes.   3.  HLD --LDL 40, HDL 36 with goal < 70 LDL, labs from Dr. Dwyane Dee.  Continue statin.  4. Anemia, last hgb stable   5.  DM per Dr. Dwyane Dee.  Follow up in 1 year with Dr. Marlou Porch in 6 months.    Current medicines are reviewed with the patient today.  The patient Has no concerns regarding medicines.  The following changes have been made:  See above Labs/ tests ordered today include:see above  Disposition:   FU:  see above  Signed, Cecilie Kicks, NP  01/24/2021 10:56 PM    [facility] [place], [place] [place], [place] Phone: [phone]; Fax: [phone]

## 2021-02-08 ENCOUNTER — Other Ambulatory Visit: Payer: Self-pay | Admitting: Endocrinology

## 2021-02-20 ENCOUNTER — Other Ambulatory Visit: Payer: Self-pay | Admitting: Endocrinology

## 2021-03-05 ENCOUNTER — Other Ambulatory Visit: Payer: Self-pay | Admitting: Endocrinology

## 2021-03-05 ENCOUNTER — Telehealth: Payer: Self-pay | Admitting: Pharmacy Technician

## 2021-03-05 NOTE — Telephone Encounter (Signed)
Received notification from [place] regarding a prior authorization for [facility]. Prior Authorization not needed. Refill too soon.

## 2021-03-13 ENCOUNTER — Other Ambulatory Visit: Payer: Medicaid Other

## 2021-03-15 ENCOUNTER — Ambulatory Visit: Payer: Medicaid Other | Admitting: Endocrinology

## 2021-04-08 ENCOUNTER — Other Ambulatory Visit: Payer: Self-pay | Admitting: Endocrinology

## 2021-04-08 NOTE — Telephone Encounter (Signed)
[facility] calling in to request acarbose (PRECOSE) 25 MG tablet  120 tablets/30days  canagliflozin (INVOKANA) 100 MG TABS tablet  30 tablets/30days

## 2021-04-09 ENCOUNTER — Telehealth: Payer: Self-pay | Admitting: Endocrinology

## 2021-04-09 DIAGNOSIS — E1140 Type 2 diabetes mellitus with diabetic neuropathy, unspecified: Secondary | ICD-10-CM

## 2021-04-09 MED ORDER — CANAGLIFLOZIN 100 MG PO TABS: 200.0000 mg | ORAL_TABLET | Freq: Every day | ORAL | 0 refills | Status: DC

## 2021-04-09 NOTE — Telephone Encounter (Signed)
Pharmacy is requesting a refill for canagliflozin (INVOKANA) 100 MG TABS tablet [[id number]]  if 200 mg per day is requested a PA must be completed please advise

## 2021-04-10 ENCOUNTER — Other Ambulatory Visit (HOSPITAL_COMMUNITY): Payer: Self-pay

## 2021-04-10 ENCOUNTER — Telehealth: Payer: Self-pay | Admitting: Pharmacy Technician

## 2021-04-10 NOTE — Telephone Encounter (Addendum)
Patient Advocate Encounter   Received notification from [place] that prior authorization for Invokana 100mg  (2/day) is required.  Per test claim: 100mg  per day is covered and 300mg  per day is covered, but the 200mg  doesn't process.   PA submitted on 04/10/21 Key [id number] CMM says it's unable to send clinical questions at this time. Pt's ins is [employer]. Will see if I can do anything else through [facility].- Unable to verify through [employer].   Possibly will get a faxed form.    [facility] will continue to follow:   Armanda Magic, CPhT [facility] Phone: [phone] Fax:  [phone]

## 2021-04-10 NOTE — Telephone Encounter (Signed)
[name] [name] with [facility] requests to be called at ph# [phone] re: JX for [place]. [name] [name] states he needs to talk to clinic staff.

## 2021-04-11 ENCOUNTER — Other Ambulatory Visit (HOSPITAL_COMMUNITY): Payer: Self-pay

## 2021-04-11 NOTE — Telephone Encounter (Signed)
Patient Advocate Encounter  Info from CoverMyMeds: Additional Information Required: This drug is subject to a high cost review. The pharmacy needs to contact the appropriate support line for an override.  Called Pharmacy to inform them to call.

## 2021-04-12 ENCOUNTER — Other Ambulatory Visit: Payer: Self-pay | Admitting: Endocrinology

## 2021-04-12 DIAGNOSIS — E1140 Type 2 diabetes mellitus with diabetic neuropathy, unspecified: Secondary | ICD-10-CM

## 2021-04-12 MED ORDER — CANAGLIFLOZIN 300 MG PO TABS: 300.0000 mg | ORAL_TABLET | Freq: Every day | ORAL | 3 refills | Status: DC

## 2021-04-12 NOTE — Telephone Encounter (Signed)
Called and was advised pt rx was approved and filled.

## 2021-05-10 ENCOUNTER — Other Ambulatory Visit: Payer: Self-pay | Admitting: Endocrinology

## 2021-06-03 ENCOUNTER — Other Ambulatory Visit: Payer: Self-pay

## 2021-06-03 ENCOUNTER — Other Ambulatory Visit (INDEPENDENT_AMBULATORY_CARE_PROVIDER_SITE_OTHER): Payer: Medicaid Other

## 2021-06-03 DIAGNOSIS — E063 Autoimmune thyroiditis: Secondary | ICD-10-CM | POA: Diagnosis not present

## 2021-06-03 DIAGNOSIS — E1165 Type 2 diabetes mellitus with hyperglycemia: Secondary | ICD-10-CM | POA: Diagnosis not present

## 2021-06-03 LAB — COMPREHENSIVE METABOLIC PANEL
ALT: 9 U/L (ref 0–35)
AST: 13 U/L (ref 0–37)
Albumin: 4.6 g/dL (ref 3.5–5.2)
Alkaline Phosphatase: 38 U/L — ABNORMAL LOW (ref 39–117)
BUN: 14 mg/dL (ref 6–23)
CO2: 27 mEq/L (ref 19–32)
Calcium: 10.6 mg/dL — ABNORMAL HIGH (ref 8.4–10.5)
Chloride: 101 mEq/L (ref 96–112)
Creatinine, Ser: 0.79 mg/dL (ref 0.40–1.20)
GFR: 77.59 mL/min (ref >60.00)
Glucose, Bld: 121 mg/dL — ABNORMAL HIGH (ref 70–99)
Potassium: 4.8 mEq/L (ref 3.5–5.1)
Sodium: 138 mEq/L (ref 135–145)
Total Bilirubin: 0.5 mg/dL (ref 0.2–1.2)
Total Protein: 7.2 g/dL (ref 6.0–8.3)

## 2021-06-03 LAB — HEMOGLOBIN A1C: Hgb A1c MFr Bld: 7.4 % — ABNORMAL HIGH (ref 4.6–6.5)

## 2021-06-03 LAB — TSH: TSH: 2.2 u[IU]/mL (ref 0.35–5.50)

## 2021-06-06 ENCOUNTER — Other Ambulatory Visit: Payer: Self-pay

## 2021-06-06 ENCOUNTER — Ambulatory Visit (INDEPENDENT_AMBULATORY_CARE_PROVIDER_SITE_OTHER): Payer: Medicaid Other | Admitting: Endocrinology

## 2021-06-06 ENCOUNTER — Encounter: Payer: Self-pay | Admitting: Endocrinology

## 2021-06-06 ENCOUNTER — Other Ambulatory Visit: Payer: Self-pay | Admitting: Cardiology

## 2021-06-06 VITALS — BP 130/78 | HR 71 | Ht 59.0 in | Wt 133.2 lb

## 2021-06-06 DIAGNOSIS — E1165 Type 2 diabetes mellitus with hyperglycemia: Secondary | ICD-10-CM

## 2021-06-06 DIAGNOSIS — I10 Essential (primary) hypertension: Secondary | ICD-10-CM

## 2021-06-06 DIAGNOSIS — E039 Hypothyroidism, unspecified: Secondary | ICD-10-CM

## 2021-06-06 DIAGNOSIS — M5431 Sciatica, right side: Secondary | ICD-10-CM

## 2021-06-06 MED ORDER — EMPAGLIFLOZIN 25 MG PO TABS: 25.0000 mg | ORAL_TABLET | Freq: Every day | ORAL | 2 refills | Status: DC

## 2021-06-06 MED ORDER — TRULICITY 1.5 MG/0.5ML ~~LOC~~ SOAJ: SUBCUTANEOUS | 1 refills | Status: DC

## 2021-06-06 NOTE — Patient Instructions (Addendum)
Start TRULICITY 6.70 with the pen as shown once weekly on the same day of the week.  You may inject in the stomach, thigh or arm as indicated in the brochure given.  You will feel fullness of the stomach with starting the medication and should try to keep the portions at meals small.  You may experience nausea in the first few days which usually gets better over time   If any questions or concerns are present call the office or the  [facility] at [phone]. Also visit [email or web address] website for more useful information  Check blood sugars on waking up 3 days a week  Also check blood sugars about 2 hours after meals and do this after different meals by rotation  Recommended blood sugar levels on waking up are 90-130 and about 2 hours after meal is 130-160  Please bring your blood sugar monitor to each visit, thank you  Metformin 1 in am and 2 in pm  Stop Acarbose  New Rx replacing Invokana  PCP Dr Shelbie Proctor

## 2021-06-06 NOTE — Progress Notes (Signed)
Patient ID: [name] [name], female   DOB: 1955/08/18, 65 y.o.   MRN: [id number]           Reason for Appointment: Follow-up    History of Present Illness:          Diagnosis: Type 2 diabetes mellitus, date of diagnosis: ?  2014   Past history:   Her son thinks that she may have had diabetes diagnosed when she was in [place] When she moved to [facility] in 2014 she was not on any medications She was however found to have significant hyperglycemia with glucose 364 and A1c of 11.7 when she was admitted for her coronary artery disease in 04/2013. At that time she was started on premixed insulin twice a day Her level of control had not been adequate with persistently high A1c readings She did not tolerate Victoza well and was having decreased appetite, malaise and nausea and this was stopped subsequently  Recent history:        Oral hypoglycemic drugs:     Metformin ER 500 mg 4 tablets daily, Invokana 300 mg daily, Precose 25 mg before meals   Her A1c has gone up and still the same at 7.4, has been as low as 6.6 last year  She has not been seen in follow-up since June  Current blood sugar patterns and problems: Her meter could not be downloaded as the battery ran out  She thinks her blood sugars are mostly high after breakfast but not other meals  Also lab glucose was fairly good at 121 fasting  She now says that she has gaseousness and occasional diarrhea since Invokana was increased although previously had no side effect  Also he does not think that she had GI side effects from metformin previously  She is usually eating a rice preparation for her breakfast without any protein; she does not eat eggs, does not like cheese and does not tolerate yogurt  she is not able to do any walking, limited by persistent pain on the right leg Weight is about the same   Side effects from medications have been: None  Self-care: The diet that the patient has been following is: None, has  vegetarian diet Usually having vegetables and roti with lunch and dinner and sometimes having lentils.   She does not like yogurt as it apparently causes heartburn  Usually not eating fried food                  Dietician visit, none   Glucose monitoring:  done up to 2 times a day       Glucometer: Accu-Chek  Blood Glucose readings not available  Previously:  PRE-MEAL Fasting Lunch Dinner Bedtime Overall  Glucose range:     118-184  Mean/median: 125    141/137   POST-MEAL PC Breakfast PC Lunch PC Dinner  Glucose range:     Mean/median: 156 134 151     Weight history:   Wt Readings from Last 3 Encounters:  06/06/21 133 lb 3.2 oz (60.4 kg)  01/23/21 130 lb 9.6 oz (59.2 kg)  12/07/20 130 lb 12.8 oz (59.3 kg)    Glycemic control:   Lab Results  Component Value Date   HGBA1C 7.4 (H) 06/03/2021   HGBA1C 7.4 (H) 12/05/2020   HGBA1C 7.1 (H) 10/01/2020   Lab Results  Component Value Date   MICROALBUR 1.6 05/25/2020   LDLCALC 40 10/01/2020   CREATININE 0.79 06/03/2021    Lab on 06/03/2021  Component  Date Value Ref Range Status   TSH 06/03/2021 2.20  0.35 - 5.50 uIU/mL Final   Sodium 06/03/2021 138  135 - 145 mEq/L Final   Potassium 06/03/2021 4.8  3.5 - 5.1 mEq/L Final   Chloride 06/03/2021 101  96 - 112 mEq/L Final   CO2 06/03/2021 27  19 - 32 mEq/L Final   Glucose, Bld 06/03/2021 121 (H)  70 - 99 mg/dL Final   BUN 06/03/2021 14  6 - 23 mg/dL Final   Creatinine, Ser 06/03/2021 0.79  0.40 - 1.20 mg/dL Final   Total Bilirubin 06/03/2021 0.5  0.2 - 1.2 mg/dL Final   Alkaline Phosphatase 06/03/2021 38 (L)  39 - 117 U/L Final   AST 06/03/2021 13  0 - 37 U/L Final   ALT 06/03/2021 9  0 - 35 U/L Final   Total Protein 06/03/2021 7.2  6.0 - 8.3 g/dL Final   Albumin 06/03/2021 4.6  3.5 - 5.2 g/dL Final   GFR 06/03/2021 77.59  >60.00 mL/min Final   Calculated using the CKD-EPI Creatinine Equation (2021)   Calcium 06/03/2021 10.6 (H)  8.4 - 10.5 mg/dL Final   Hgb A1c MFr  Bld 06/03/2021 7.4 (H)  4.6 - 6.5 % Final   Glycemic Control Guidelines for People with Diabetes:Non Diabetic:  <6%Goal of Therapy: <7%Additional Action Suggested:  >8%       Allergies as of 06/06/2021   No Known Allergies      Medication List        Accurate as of June 06, 2021  9:13 AM. If you have any questions, ask your nurse or doctor.          acarbose 25 MG tablet Commonly known as: PRECOSE TAKE 1 TABLET BY MOUTH AT BREAKFAST, THEN TAKE 2 TABLETS BY MOUTH AT [facility] AND 1 TABLET BY MOUTH AT DINNER   Accu-Chek FastClix Lancets Misc USE TO CHECK BLOOD SUGAR 3 TIMES PER DAY   Accu-Chek SmartView test strip Generic drug: glucose blood USE THREE TIMES PER DAY TO CHECK BLOOD SUGAR   aspirin EC 81 MG tablet Take 1 tablet (81 mg total) by mouth daily.   atorvastatin 80 MG tablet Commonly known as: LIPITOR Take 1 tablet (80 mg total) by mouth daily at 6 PM.   canagliflozin 300 MG Tabs tablet Commonly known as: INVOKANA Take 1 tablet (300 mg total) by mouth daily before breakfast.   gabapentin 100 MG capsule Commonly known as: NEURONTIN TAKE 2 CAPSULES (200 MG TOTAL) BY MOUTH AT BEDTIME.   levothyroxine 50 MCG tablet Commonly known as: SYNTHROID Take 1 tablet (50 mcg total) by mouth daily.   lisinopril 20 MG tablet Commonly known as: ZESTRIL Take 1 tablet (20 mg total) by mouth daily.   metFORMIN 500 MG 24 hr tablet Commonly known as: GLUCOPHAGE-XR TAKE 4 TABLETS BY MOUTH DAILY WITH SUPPER   metoprolol tartrate 25 MG tablet Commonly known as: LOPRESSOR Take 0.5 tablets (12.5 mg total) by mouth 2 (two) times daily.   nitroGLYCERIN 0.4 MG SL tablet Commonly known as: NITROSTAT Place 1 tablet (0.4 mg total) under the tongue every 5 (five) minutes as needed for chest pain.   ondansetron 4 MG disintegrating tablet Commonly known as: Zofran ODT Take 1 tablet (4 mg total) by mouth every 8 (eight) hours as needed for nausea or vomiting.   pantoprazole 40  MG tablet Commonly known as: PROTONIX Take 1 tablet (40 mg total) by mouth daily.        Allergies:  No Known Allergies  Past Medical History:  Diagnosis Date   Anemia    CAD (coronary artery disease)    a. 04/2013 Inf STEMI/PCI: LM nl, LAD min irregs, D1/2/3 nl, LCX 74m (2.5x18 Xience DES), OM1/2/3 nl, RCA nl, PDA nl, RPAV nl, RPL nl, EF 55%;  b. 04/2013 Echo: EF 55-60%, no rwma, mild MR, mildly dil LA.   Diabetes mellitus type 2, uncontrolled    a. 04/2013 HbA1c = 11.7.   GERD (gastroesophageal reflux disease)    Hyperlipidemia    Hypertension    Hypothyroidism     Past Surgical History:  Procedure Laterality Date   CESAREAN SECTION     LEFT HEART CATH  05-15-13   LEFT HEART CATHETERIZATION WITH CORONARY ANGIOGRAM N/A 05/15/2013   Procedure: LEFT HEART CATHETERIZATION WITH CORONARY ANGIOGRAM;  Surgeon: Wellington Hampshire, MD;  Location: [place] CATH LAB;  Service: Cardiovascular;  Laterality: N/A;   PERCUTANEOUS STENT INTERVENTION  05/15/2013   Procedure: PERCUTANEOUS STENT INTERVENTION;  Surgeon: Wellington Hampshire, MD;  Location: [place] CATH LAB;  Service: Cardiovascular;;    Family History  Problem Relation Age of Onset   Heart disease Mother    Diabetes Mother    Hypertension Mother    Heart disease Father    Diabetes Father    Hypertension Father    Diabetes Brother     Social History:  reports that she has never smoked. She has never used smokeless tobacco. She reports that she does not drink alcohol and does not use drugs.    Review of Systems   HYPOTHYROIDISM: Screening TSH had been done because of her complaints of carpal tunnel syndrome and some fatigue Because of her high TSH she has been on levothyroxine 25 mcg daily He takes this regularly before breakfast and has not missed any doses lately  In 4/22 was having symptoms of hair loss and also complaining of some fatigue With taking 50 mcg levothyroxine for symptoms and improved Subsequent TSH level has been  normal  No history of goiter  Last TSH:  Lab Results  Component Value Date   TSH 2.20 06/03/2021   TSH 2.87 12/05/2020   TSH 7.84 (H) 10/01/2020   FREET4 0.93 10/01/2020   FREET4 0.96 06/20/2019   FREET4 0.68 09/03/2017     NEUROPATHY: She has  symptoms of numbness and some tingling in her hands and usually when waking up Has only minimal pains in her feet with some burning She is now complaining about the numbness in her hands and tingling during the night despite taking gabapentin  Symptoms are improved with gabapentin  Lipids: She is treated with Lipitor 80 mg for several years Lipids are well controlled, has history of CAD followed by cardiologist      Lab Results  Component Value Date   CHOL 101 10/01/2020   HDL 36.90 (L) 10/01/2020   LDLCALC 40 10/01/2020   TRIG 118.0 10/01/2020   CHOLHDL 3 10/01/2020                 HYPERTENSION   The blood pressure has been controlled with lisinopril 10 mg    BP Readings from Last 3 Encounters:  06/06/21 130/78  01/23/21 120/76  12/07/20 118/68   She had a screening B12 level done because of neuropathic symptoms and long-term Metformin Had a low level at baseline and has been supplemented  Lab Results  Component Value Date   VITAMINB12 >1500 (H) 09/26/2019    No history of anemia  Lab Results  Component Value Date   HGB 13.4 03/21/2020    She has persistent right leg pain going up to the proximal leg, more severe in the lower leg and has difficulty walking with this   Is not established with a PCP  LABS:  Lab on 06/03/2021  Component Date Value Ref Range Status   TSH 06/03/2021 2.20  0.35 - 5.50 uIU/mL Final   Sodium 06/03/2021 138  135 - 145 mEq/L Final   Potassium 06/03/2021 4.8  3.5 - 5.1 mEq/L Final   Chloride 06/03/2021 101  96 - 112 mEq/L Final   CO2 06/03/2021 27  19 - 32 mEq/L Final   Glucose, Bld 06/03/2021 121 (H)  70 - 99 mg/dL Final   BUN 06/03/2021 14  6 - 23 mg/dL Final   Creatinine, Ser  06/03/2021 0.79  0.40 - 1.20 mg/dL Final   Total Bilirubin 06/03/2021 0.5  0.2 - 1.2 mg/dL Final   Alkaline Phosphatase 06/03/2021 38 (L)  39 - 117 U/L Final   AST 06/03/2021 13  0 - 37 U/L Final   ALT 06/03/2021 9  0 - 35 U/L Final   Total Protein 06/03/2021 7.2  6.0 - 8.3 g/dL Final   Albumin 06/03/2021 4.6  3.5 - 5.2 g/dL Final   GFR 06/03/2021 77.59  >60.00 mL/min Final   Calculated using the CKD-EPI Creatinine Equation (2021)   Calcium 06/03/2021 10.6 (H)  8.4 - 10.5 mg/dL Final   Hgb A1c MFr Bld 06/03/2021 7.4 (H)  4.6 - 6.5 % Final   Glycemic Control Guidelines for People with Diabetes:Non Diabetic:  <6%Goal of Therapy: <7%Additional Action Suggested:  >8%     Physical Examination:  BP 130/78    Pulse 71    Ht 4\' 11"  (1.499 m)    Wt 133 lb 3.2 oz (60.4 kg)    SpO2 99%    BMI 26.90 kg/m     ASSESSMENT:  Diabetes type 2, non-insulin requiring  See history of present illness for discussion of current diabetes management, blood sugar patterns and problems identified  A1c is 7.4 and still relatively higher than usual  She is on Invokana 300 mg, metformin 2 g daily and Precose 25 mg daily  Her blood sugars are not improved with increasing her Invokana Also likely has high postprandial readings since fasting readings are mostly in the 120s including in the lab She is not able to do an exercise Weight is up slightly  She is now complaining of GI symptoms such as gaseousness and occasional diarrhea and this may be related to PCOS and metformin rather than increased Invokana as she reports Diet is relatively high in carbohydrate and no protein intake at breakfast  Hypothyroidism: Well-controlled   HYPERTENSION: Blood pressure is controlled with tenogram lisinopril  PLAN:   Switch Invokana to Jardiance to see if this will help her tolerability Also will reduce metformin to 1 tablet in the morning as this may be causing occasional diarrhea  However will stop Precose and  start TRULICITY for better efficacy  Discussed with the patient the nature of GLP-1 drugs, the action on various organ systems, improved satiety, how they benefit blood glucose control, as well as the benefit of weight loss. Explained possible side effects of TRULICITY, most commonly nausea that usually improves over time; discussed safety information in package insert.  Demonstrated the medication injection device and injection technique to the patient.  Showed patient possible injection sites To start with 0.75 mg dosage  weekly with a sample device for the first 4 injections and then go to 1.5 mg if tolerated  Patient brochure on Trulicity with enclosed co-pay card given  Needs more regular follow-up  Continue 10 mg lisinopril  She will continue 50 mcg of levothyroxine  Given name of PCP to establish with for general care  Sports medicine consultation for likely sciatica pain   There are no Patient Instructions on file for this visit.  Elayne Snare 06/06/2021, 9:13 AM    Note: This office note was prepared with Dragon voice recognition system technology. Any transcriptional errors that result from this process are unintentional.

## 2021-06-07 ENCOUNTER — Other Ambulatory Visit (HOSPITAL_COMMUNITY): Payer: Self-pay

## 2021-06-07 ENCOUNTER — Other Ambulatory Visit: Payer: Self-pay | Admitting: Endocrinology

## 2021-06-07 ENCOUNTER — Telehealth: Payer: Self-pay | Admitting: Pharmacy Technician

## 2021-06-07 NOTE — Telephone Encounter (Signed)
Patient Advocate Encounter  Received notification from [place] that prior authorization for JARDIANCE 25MG  is required.   PA submitted on 12.23.22 Key  [id number] Status is pending   [facility] will continue to follow  Luciano Cutter, CPhT Patient Advocate [facility] Phone: [phone] Fax:  [phone]  Received notification from [place] regarding a prior authorization for [place] 25MG . Authorization has been APPROVED from 12.23.22 to 12.23.23.   Authorization # PA Case ID: [id number]

## 2021-07-12 ENCOUNTER — Other Ambulatory Visit: Payer: Self-pay | Admitting: Cardiology

## 2021-07-12 ENCOUNTER — Other Ambulatory Visit: Payer: Self-pay | Admitting: Endocrinology

## 2021-07-12 DIAGNOSIS — I10 Essential (primary) hypertension: Secondary | ICD-10-CM

## 2021-07-23 ENCOUNTER — Encounter: Payer: Self-pay | Admitting: Cardiology

## 2021-07-23 ENCOUNTER — Other Ambulatory Visit: Payer: Self-pay

## 2021-07-23 ENCOUNTER — Ambulatory Visit: Payer: Medicaid Other | Admitting: Cardiology

## 2021-07-23 DIAGNOSIS — I2510 Atherosclerotic heart disease of native coronary artery without angina pectoris: Secondary | ICD-10-CM

## 2021-07-23 DIAGNOSIS — E785 Hyperlipidemia, unspecified: Secondary | ICD-10-CM

## 2021-07-23 DIAGNOSIS — I252 Old myocardial infarction: Secondary | ICD-10-CM

## 2021-07-23 DIAGNOSIS — E781 Pure hyperglyceridemia: Secondary | ICD-10-CM

## 2021-07-23 DIAGNOSIS — E1169 Type 2 diabetes mellitus with other specified complication: Secondary | ICD-10-CM | POA: Diagnosis not present

## 2021-07-23 MED ORDER — NITROGLYCERIN 0.4 MG SL SUBL: 0.4000 mg | SUBLINGUAL_TABLET | SUBLINGUAL | 3 refills | Status: DC | PRN

## 2021-07-23 NOTE — Patient Instructions (Addendum)
Medication Instructions:  Your physician recommends that you continue on your current medications as directed. Please refer to the Current Medication list given to you today. *If you need a refill on your cardiac medications before your next appointment, please call your pharmacy*   Lab Work: NONE ORDERED If you have labs (blood work) drawn today and your tests are completely normal, you will receive your results only by: [place] (if you have MyChart) OR A paper copy in the mail If you have any lab test that is abnormal or we need to change your treatment, we will call you to review the results.   Testing/Procedures: NONE ORDERED    Follow-Up: At [facility], you and your health needs are our priority.  As part of our continuing mission to provide you with exceptional heart care, we have created designated Provider Care Teams.  These Care Teams include your primary Cardiologist (physician) and Advanced Practice Providers (APPs -  Physician Assistants and Nurse Practitioners) who all work together to provide you with the care you need, when you need it.  We recommend signing up for the patient portal called "MyChart".  Sign up information is provided on this After Visit Summary.  MyChart is used to connect with patients for Virtual Visits (Telemedicine).  Patients are able to view lab/test results, encounter notes, upcoming appointments, etc.  Non-urgent messages can be sent to your provider as well.   To learn more about what you can do with MyChart, go to [email or web address].    Your next appointment:   1 year(s)  The format for your next appointment:   In Person  Provider:   Candee Furbish, MD

## 2021-07-23 NOTE — Assessment & Plan Note (Signed)
Continue with goal-directed medical therapy.

## 2021-07-23 NOTE — Assessment & Plan Note (Signed)
Stable CAD no chest discomfort no anginal symptoms.  Had prior inferior STEMI in 2014.  Had a DES to left circumflex.  EF is normal.  Used to be on Plavix but this was stopped because of iron deficiency anemia.  On statin and beta-blocker.

## 2021-07-23 NOTE — Assessment & Plan Note (Signed)
Seen by Dr. Dwyane Dee.  Overall doing well.  Lab work reviewed.  Medications reviewed as above.  No change in medical management.

## 2021-07-23 NOTE — Progress Notes (Signed)
Cardiology Office Note:    Date:  07/23/2021   ID:  Aleigh Grunden, DOB Mar 19, 1956, MRN [id number]  PCP:  Merryl Hacker No   [facility] Providers Cardiologist:  Candee Furbish, MD     Referring MD: No ref. provider found    History of Present Illness:    Rio Taber is a 66 y.o. female here for the follow-up of coronary artery disease.  Has drug-eluting stent to mid circumflex in the setting of inferior STEMI back in 2014.  LDL goal should be less than 55.  EF was preserved.  She also has diabetes hypertension hypothyroidism and hyperlipidemia.  She had anemia and was found to be iron deficient.  No longer on Plavix.  She has had COVID.  Her son helps with translation.  Overall she has been feeling quite well.  No chest pain no fevers chills nausea vomiting syncope bleeding.  Past Medical History:  Diagnosis Date   Anemia    CAD (coronary artery disease)    a. 04/2013 Inf STEMI/PCI: LM nl, LAD min irregs, D1/2/3 nl, LCX 92m (2.5x18 Xience DES), OM1/2/3 nl, RCA nl, PDA nl, RPAV nl, RPL nl, EF 55%;  b. 04/2013 Echo: EF 55-60%, no rwma, mild MR, mildly dil LA.   Diabetes mellitus type 2, uncontrolled    a. 04/2013 HbA1c = 11.7.   GERD (gastroesophageal reflux disease)    Hyperlipidemia    Hypertension    Hypothyroidism     Past Surgical History:  Procedure Laterality Date   CESAREAN SECTION     LEFT HEART CATH  05-15-13   LEFT HEART CATHETERIZATION WITH CORONARY ANGIOGRAM N/A 05/15/2013   Procedure: LEFT HEART CATHETERIZATION WITH CORONARY ANGIOGRAM;  Surgeon: Wellington Hampshire, MD;  Location: [place] CATH LAB;  Service: Cardiovascular;  Laterality: N/A;   PERCUTANEOUS STENT INTERVENTION  05/15/2013   Procedure: PERCUTANEOUS STENT INTERVENTION;  Surgeon: Wellington Hampshire, MD;  Location: [place] CATH LAB;  Service: Cardiovascular;;    Current Medications: Current Meds  Medication Sig   acarbose (PRECOSE) 25 MG tablet TAKE 1 TABLET BY MOUTH AT BREAKFAST, THEN TAKE 2 TABLETS BY MOUTH AT  [facility] AND 1 TABLET BY MOUTH AT DINNER   Accu-Chek FastClix Lancets MISC USE TO CHECK BLOOD SUGAR 3 TIMES PER DAY   ACCU-CHEK SMARTVIEW test strip USE THREE TIMES PER DAY TO CHECK BLOOD SUGAR   aspirin EC 81 MG tablet Take 1 tablet (81 mg total) by mouth daily.   atorvastatin (LIPITOR) 80 MG tablet TAKE 1 TABLET (80 MG TOTAL) BY MOUTH DAILY AT 6 PM.   empagliflozin (JARDIANCE) 25 MG TABS tablet Take 1 tablet (25 mg total) by mouth daily before breakfast.   gabapentin (NEURONTIN) 100 MG capsule TAKE 2 CAPSULES (200 MG TOTAL) BY MOUTH AT BEDTIME.   levothyroxine (SYNTHROID) 50 MCG tablet Take 1 tablet (50 mcg total) by mouth daily.   lisinopril (ZESTRIL) 20 MG tablet TAKE 1 TABLET (20 MG TOTAL) BY MOUTH DAILY.   metFORMIN (GLUCOPHAGE-XR) 500 MG 24 hr tablet TAKE 4 TABLETS BY MOUTH DAILY WITH SUPPER   metoprolol tartrate (LOPRESSOR) 25 MG tablet TAKE 1/2 TABLET (12.5 MG TOTAL) BY MOUTH 2 (TWO) TIMES DAILY.   ondansetron (ZOFRAN ODT) 4 MG disintegrating tablet Take 1 tablet (4 mg total) by mouth every 8 (eight) hours as needed for nausea or vomiting.   pantoprazole (PROTONIX) 40 MG tablet TAKE 1 TABLET (40 MG TOTAL) BY MOUTH DAILY.   TRULICITY 1.5 QI/3.4VQ SOPN INJECT CONTENTS OF 1 PEN WEEKLY   [  DISCONTINUED] nitroGLYCERIN (NITROSTAT) 0.4 MG SL tablet Place 1 tablet (0.4 mg total) under the tongue every 5 (five) minutes as needed for chest pain.     Allergies:   Patient has no known allergies.   Social History   Socioeconomic History   Marital status: Widowed    Spouse name: Not on file   Number of children: Not on file   Years of education: Not on file   Highest education level: Not on file  Occupational History   Not on file  Tobacco Use   Smoking status: Never   Smokeless tobacco: Never  Vaping Use   Vaping Use: Never used  Substance and Sexual Activity   Alcohol use: No   Drug use: No   Sexual activity: Not on file  Other Topics Concern   Not on file  Social History Narrative    Not on file   Social Determinants of Health   Financial Resource Strain: Not on file  Food Insecurity: Not on file  Transportation Needs: Not on file  Physical Activity: Not on file  Stress: Not on file  Social Connections: Not on file     Family History: The patient's family history includes Diabetes in her brother, father, and mother; Heart disease in her father and mother; Hypertension in her father and mother.  ROS:   Please see the history of present illness.    No fevers chills nausea vomiting syncope bleeding all other systems reviewed and are negative.  EKGs/Labs/Other Studies Reviewed:    The following studies were reviewed today: Echocardiogram 2020 ejection fraction 55 to 60%  EKG:  EKG is  ordered today.  The ekg ordered today demonstrates sinus rhythm 79 with inferior Q waves old MI  Recent Labs: 06/03/2021: ALT 9; BUN 14; Creatinine, Ser 0.79; Potassium 4.8; Sodium 138; TSH 2.20  Recent Lipid Panel    Component Value Date/Time   CHOL 101 10/01/2020 0831   CHOL 87 (L) 11/29/2019 0831   TRIG 118.0 10/01/2020 0831   HDL 36.90 (L) 10/01/2020 0831   HDL 30 (L) 11/29/2019 0831   CHOLHDL 3 10/01/2020 0831   VLDL 23.6 10/01/2020 0831   LDLCALC 40 10/01/2020 0831   LDLCALC 36 11/29/2019 0831     Risk Assessment/Calculations:              Physical Exam:    VS:  BP 110/66    Pulse 79    Ht 4\' 11"  (1.499 m)    Wt 126 lb (57.2 kg)    SpO2 98%    BMI 25.45 kg/m     Wt Readings from Last 3 Encounters:  07/23/21 126 lb (57.2 kg)  06/06/21 133 lb 3.2 oz (60.4 kg)  01/23/21 130 lb 9.6 oz (59.2 kg)     GEN:  Well nourished, well developed in no acute distress HEENT: Normal NECK: No JVD; No carotid bruits LYMPHATICS: No lymphadenopathy CARDIAC: RRR, no murmurs, no rubs, gallops RESPIRATORY:  Clear to auscultation without rales, wheezing or rhonchi  ABDOMEN: Soft, non-tender, non-distended MUSCULOSKELETAL:  No edema; No deformity  SKIN: Warm and  dry NEUROLOGIC:  Alert and oriented x 3 PSYCHIATRIC:  Normal affect   ASSESSMENT:    1. Coronary artery disease involving native coronary artery of native heart without angina pectoris   2. Prior inferior STEMI 2014-DES to left circumflex   3. Pure hypertriglyceridemia   4. Type 2 diabetes mellitus with hyperlipidemia (HCC)    PLAN:    In order of problems  listed above:  CAD (coronary artery disease) Stable CAD no chest discomfort no anginal symptoms.  Had prior inferior STEMI in 2014.  Had a DES to left circumflex.  EF is normal.  Used to be on Plavix but this was stopped because of iron deficiency anemia.  On statin and beta-blocker.  Prior inferior STEMI 2014-DES to left circumflex Continue with goal-directed medical therapy.  Hyperlipidemia LDL in the past 40.  Excellent.  LDL goal of less than 55.  No myalgias.  Continue with statin therapy.  High intensity atorvastatin 80 mg a day  Type 2 diabetes mellitus with hyperlipidemia ([place]) Seen by Dr. Dwyane Dee.  Overall doing well.  Lab work reviewed.  Medications reviewed as above.  No change in medical management.         Medication Adjustments/Labs and Tests Ordered: Current medicines are reviewed at length with the patient today.  Concerns regarding medicines are outlined above.  Orders Placed This Encounter  Procedures   EKG 12-Lead   Meds ordered this encounter  Medications   nitroGLYCERIN (NITROSTAT) 0.4 MG SL tablet    Sig: Place 1 tablet (0.4 mg total) under the tongue every 5 (five) minutes as needed for chest pain.    Dispense:  25 tablet    Refill:  3    Patient Instructions  Medication Instructions:  Your physician recommends that you continue on your current medications as directed. Please refer to the Current Medication list given to you today. *If you need a refill on your cardiac medications before your next appointment, please call your pharmacy*   Lab Work: NONE ORDERED If you have labs (blood work)  drawn today and your tests are completely normal, you will receive your results only by: [place] (if you have MyChart) OR A paper copy in the mail If you have any lab test that is abnormal or we need to change your treatment, we will call you to review the results.   Testing/Procedures: NONE ORDERED    Follow-Up: At [facility], you and your health needs are our priority.  As part of our continuing mission to provide you with exceptional heart care, we have created designated Provider Care Teams.  These Care Teams include your primary Cardiologist (physician) and Advanced Practice Providers (APPs -  Physician Assistants and Nurse Practitioners) who all work together to provide you with the care you need, when you need it.  We recommend signing up for the patient portal called "MyChart".  Sign up information is provided on this After Visit Summary.  MyChart is used to connect with patients for Virtual Visits (Telemedicine).  Patients are able to view lab/test results, encounter notes, upcoming appointments, etc.  Non-urgent messages can be sent to your provider as well.   To learn more about what you can do with MyChart, go to [email or web address].    Your next appointment:   1 year(s)  The format for your next appointment:   In Person  Provider:   Candee Furbish, MD      Signed, Candee Furbish, MD  07/23/2021 4:31 PM    [place]

## 2021-07-23 NOTE — Assessment & Plan Note (Signed)
LDL in the past 40.  Excellent.  LDL goal of less than 55.  No myalgias.  Continue with statin therapy.  High intensity atorvastatin 80 mg a day

## 2021-08-05 ENCOUNTER — Other Ambulatory Visit: Payer: Medicaid Other

## 2021-08-06 ENCOUNTER — Other Ambulatory Visit: Payer: Self-pay

## 2021-08-06 ENCOUNTER — Other Ambulatory Visit (INDEPENDENT_AMBULATORY_CARE_PROVIDER_SITE_OTHER): Payer: Medicaid Other

## 2021-08-06 DIAGNOSIS — E1165 Type 2 diabetes mellitus with hyperglycemia: Secondary | ICD-10-CM | POA: Diagnosis not present

## 2021-08-06 LAB — BASIC METABOLIC PANEL
BUN: 9 mg/dL (ref 6–23)
CO2: 31 mEq/L (ref 19–32)
Calcium: 10.2 mg/dL (ref 8.4–10.5)
Chloride: 101 mEq/L (ref 96–112)
Creatinine, Ser: 0.74 mg/dL (ref 0.40–1.20)
GFR: 83.82 mL/min (ref >60.00)
Glucose, Bld: 103 mg/dL — ABNORMAL HIGH (ref 70–99)
Potassium: 4.8 mEq/L (ref 3.5–5.1)
Sodium: 138 mEq/L (ref 135–145)

## 2021-08-07 ENCOUNTER — Encounter: Payer: Self-pay | Admitting: Endocrinology

## 2021-08-07 ENCOUNTER — Ambulatory Visit: Payer: Medicaid Other | Admitting: Endocrinology

## 2021-08-07 ENCOUNTER — Other Ambulatory Visit: Payer: Self-pay

## 2021-08-07 VITALS — BP 122/74 | HR 75 | Ht 59.0 in | Wt 124.8 lb

## 2021-08-07 DIAGNOSIS — E039 Hypothyroidism, unspecified: Secondary | ICD-10-CM | POA: Diagnosis not present

## 2021-08-07 DIAGNOSIS — I10 Essential (primary) hypertension: Secondary | ICD-10-CM

## 2021-08-07 DIAGNOSIS — E1140 Type 2 diabetes mellitus with diabetic neuropathy, unspecified: Secondary | ICD-10-CM

## 2021-08-07 DIAGNOSIS — E782 Mixed hyperlipidemia: Secondary | ICD-10-CM

## 2021-08-07 LAB — FRUCTOSAMINE: Fructosamine: 215 umol/L (ref 0–285)

## 2021-08-07 MED ORDER — ACCU-CHEK GUIDE VI STRP: ORAL_STRIP | 2 refills | Status: DC

## 2021-08-07 MED ORDER — ACCU-CHEK GUIDE W/DEVICE KIT: PACK | 0 refills | Status: AC

## 2021-08-07 NOTE — Patient Instructions (Signed)
Check blood sugars on waking up 2-3 days a week  Also check blood sugars about 2 hours after meals and do this after different meals by rotation  Recommended blood sugar levels on waking up are 90-130 and about 2 hours after meal is 130-160  Please bring your blood sugar monitor to each visit, thank you  Acarbose only in am

## 2021-08-07 NOTE — Progress Notes (Signed)
Patient ID: [name] [name], female   DOB: Mar 15, 1956, 66 y.o.   MRN: [id number]           Reason for Appointment: Follow-up    History of Present Illness:          Diagnosis: Type 2 diabetes mellitus, date of diagnosis: ?  2014   Past history:   Her son thinks that she may have had diabetes diagnosed when she was in [place] When she moved to [facility] in 2014 she was not on any medications She was however found to have significant hyperglycemia with glucose 364 and A1c of 11.7 when she was admitted for her coronary artery disease in 04/2013. At that time she was started on premixed insulin twice a day Her level of control had not been adequate with persistently high A1c readings She did not tolerate Victoza well and was having decreased appetite, malaise and nausea and this was stopped subsequently  Recent history:        Oral hypoglycemic drugs:     Metformin ER 500 mg 4 tablets daily, Jardiance 25 mg daily, Precose 25 mg before meals, Trulicity 1.5 mg weekly  Her A1c had been higher at 7.4 on her last visit  Current blood sugar patterns and problems: Because of higher A1c and likely postprandial hyperglycemia she was started on TRULICITY in December On 07/12/2021 this was increased up to 1.5 mg from 0.75 mg weekly She also was switched from Invokana to [place] since she thought it was causing nausea  However even though she was supposed to stop Precose she is still taking  She does not complain of gaseousness and diarrhea as much now However if she is eating more than a small portion she will tend to have some nausea and is restricting her portions  She thinks she is eating mostly 3 small meals a day With this she has lost 7 pounds, current BMI is 25 Most of her blood sugars are down in the morning and not many after meals  Fasting lab glucose was 103  She is usually eating a rice preparation for her breakfast without any protein; she does not eat eggs, does not like  cheese and does not tolerate yogurt As before she is not able to do much exercise   Side effects from medications have been: None  Self-care: The diet that the patient has been following is: None, has vegetarian diet Usually having vegetables and roti with lunch and dinner and sometimes having lentils.   She does not like yogurt as it apparently causes heartburn  Usually not eating fried food                  Dietician visit, none   Glucose monitoring:  done up to 2 times a day       Glucometer: Accu-Chek  Blood Glucose readings from download   PRE-MEAL Mornings Lunch Dinner Bedtime Overall  Glucose range: 98-116 95-106 81 89, 152   Mean/median:     107   POST-MEAL PC Breakfast PC Lunch PC Dinner  Glucose range:  ?   Mean/median:        Previously:  PRE-MEAL Fasting Lunch Dinner Bedtime Overall  Glucose range:     118-184  Mean/median: 125    141/137   POST-MEAL PC Breakfast PC Lunch PC Dinner  Glucose range:     Mean/median: 156 134 151     Weight history:   Wt Readings from Last 3 Encounters:  08/07/21 124  lb 12.8 oz (56.6 kg)  07/23/21 126 lb (57.2 kg)  06/06/21 133 lb 3.2 oz (60.4 kg)    Glycemic control:   Lab Results  Component Value Date   HGBA1C 7.4 (H) 06/03/2021   HGBA1C 7.4 (H) 12/05/2020   HGBA1C 7.1 (H) 10/01/2020   Lab Results  Component Value Date   MICROALBUR 1.6 05/25/2020   LDLCALC 40 10/01/2020   CREATININE 0.74 08/06/2021    Lab on 08/06/2021  Component Date Value Ref Range Status   Fructosamine 08/06/2021 215  0 - 285 umol/L Final   Comment: Published reference interval for apparently healthy subjects between age 76 and 82 is 20 - 285 umol/L and in a poorly controlled diabetic population is 228 - 563 umol/L with a mean of 396 umol/L.    Sodium 08/06/2021 138  135 - 145 mEq/L Final   Potassium 08/06/2021 4.8  3.5 - 5.1 mEq/L Final   Chloride 08/06/2021 101  96 - 112 mEq/L Final   CO2 08/06/2021 31  19 - 32 mEq/L Final    Glucose, Bld 08/06/2021 103 (H)  70 - 99 mg/dL Final   BUN 08/06/2021 9  6 - 23 mg/dL Final   Creatinine, Ser 08/06/2021 0.74  0.40 - 1.20 mg/dL Final   GFR 08/06/2021 83.82  >60.00 mL/min Final   Calculated using the CKD-EPI Creatinine Equation (2021)   Calcium 08/06/2021 10.2  8.4 - 10.5 mg/dL Final      Allergies as of 08/07/2021   No Known Allergies      Medication List        Accurate as of August 07, 2021  9:20 AM. If you have any questions, ask your nurse or doctor.          acarbose 25 MG tablet Commonly known as: PRECOSE TAKE 1 TABLET BY MOUTH AT BREAKFAST, THEN TAKE 2 TABLETS BY MOUTH AT [facility] AND 1 TABLET BY MOUTH AT DINNER   Accu-Chek FastClix Lancets Misc USE TO CHECK BLOOD SUGAR 3 TIMES PER DAY   Accu-Chek SmartView test strip Generic drug: glucose blood USE THREE TIMES PER DAY TO CHECK BLOOD SUGAR   aspirin EC 81 MG tablet Take 1 tablet (81 mg total) by mouth daily.   atorvastatin 80 MG tablet Commonly known as: LIPITOR TAKE 1 TABLET (80 MG TOTAL) BY MOUTH DAILY AT 6 PM.   empagliflozin 25 MG Tabs tablet Commonly known as: Jardiance Take 1 tablet (25 mg total) by mouth daily before breakfast.   gabapentin 100 MG capsule Commonly known as: NEURONTIN TAKE 2 CAPSULES (200 MG TOTAL) BY MOUTH AT BEDTIME.   levothyroxine 50 MCG tablet Commonly known as: SYNTHROID Take 1 tablet (50 mcg total) by mouth daily.   lisinopril 20 MG tablet Commonly known as: ZESTRIL TAKE 1 TABLET (20 MG TOTAL) BY MOUTH DAILY.   metFORMIN 500 MG 24 hr tablet Commonly known as: GLUCOPHAGE-XR TAKE 4 TABLETS BY MOUTH DAILY WITH SUPPER   metoprolol tartrate 25 MG tablet Commonly known as: LOPRESSOR TAKE 1/2 TABLET (12.5 MG TOTAL) BY MOUTH 2 (TWO) TIMES DAILY.   nitroGLYCERIN 0.4 MG SL tablet Commonly known as: NITROSTAT Place 1 tablet (0.4 mg total) under the tongue every 5 (five) minutes as needed for chest pain.   ondansetron 4 MG disintegrating tablet Commonly  known as: Zofran ODT Take 1 tablet (4 mg total) by mouth every 8 (eight) hours as needed for nausea or vomiting.   pantoprazole 40 MG tablet Commonly known as: PROTONIX TAKE 1 TABLET (  40 MG TOTAL) BY MOUTH DAILY.   Trulicity 1.5 VP/7.1GG Sopn Generic drug: Dulaglutide INJECT CONTENTS OF 1 PEN WEEKLY        Allergies: No Known Allergies  Past Medical History:  Diagnosis Date   Anemia    CAD (coronary artery disease)    a. 04/2013 Inf STEMI/PCI: LM nl, LAD min irregs, D1/2/3 nl, LCX 79m (2.5x18 Xience DES), OM1/2/3 nl, RCA nl, PDA nl, RPAV nl, RPL nl, EF 55%;  b. 04/2013 Echo: EF 55-60%, no rwma, mild MR, mildly dil LA.   Diabetes mellitus type 2, uncontrolled    a. 04/2013 HbA1c = 11.7.   GERD (gastroesophageal reflux disease)    Hyperlipidemia    Hypertension    Hypothyroidism     Past Surgical History:  Procedure Laterality Date   CESAREAN SECTION     LEFT HEART CATH  05-15-13   LEFT HEART CATHETERIZATION WITH CORONARY ANGIOGRAM N/A 05/15/2013   Procedure: LEFT HEART CATHETERIZATION WITH CORONARY ANGIOGRAM;  Surgeon: Wellington Hampshire, MD;  Location: [place] CATH LAB;  Service: Cardiovascular;  Laterality: N/A;   PERCUTANEOUS STENT INTERVENTION  05/15/2013   Procedure: PERCUTANEOUS STENT INTERVENTION;  Surgeon: Wellington Hampshire, MD;  Location: [place] CATH LAB;  Service: Cardiovascular;;    Family History  Problem Relation Age of Onset   Heart disease Mother    Diabetes Mother    Hypertension Mother    Heart disease Father    Diabetes Father    Hypertension Father    Diabetes Brother     Social History:  reports that she has never smoked. She has never used smokeless tobacco. She reports that she does not drink alcohol and does not use drugs.    Review of Systems   HYPOTHYROIDISM: Screening TSH had been done because of her complaints of carpal tunnel syndrome and some fatigue Because of her high TSH she has been on levothyroxine 25 mcg daily He takes this regularly  before breakfast and has not missed any doses lately  In 4/22 was having symptoms of hair loss and also complaining of some fatigue With taking 50 mcg levothyroxine for symptoms and improved Subsequent TSH level has been normal  No history of goiter  Last TSH:  Lab Results  Component Value Date   TSH 2.20 06/03/2021   TSH 2.87 12/05/2020   TSH 7.84 (H) 10/01/2020   FREET4 0.93 10/01/2020   FREET4 0.96 06/20/2019   FREET4 0.68 09/03/2017     NEUROPATHY: She has  symptoms of numbness and some tingling in her hands and usually when waking up Has only minimal pains in her feet with some burning She is now complaining about the numbness in her hands and tingling during the night despite taking gabapentin  Symptoms are improved with gabapentin  Lipids: She is treated with Lipitor 80 mg for several years Lipids are well controlled, has history of CAD followed by cardiologist      Lab Results  Component Value Date   CHOL 101 10/01/2020   HDL 36.90 (L) 10/01/2020   LDLCALC 40 10/01/2020   TRIG 118.0 10/01/2020   CHOLHDL 3 10/01/2020                 HYPERTENSION   The blood pressure has been controlled with lisinopril 10 mg    BP Readings from Last 3 Encounters:  08/07/21 122/74  07/23/21 110/66  06/06/21 130/78   She had a screening B12 level done because of neuropathic symptoms and long-term Metformin Had  a low level at baseline and has been supplemented  Lab Results  Component Value Date   VITAMINB12 >1500 (H) 09/26/2019    No history of anemia  Lab Results  Component Value Date   HGB 13.4 03/21/2020    She was referred to sports medicine for sciatica but she did not make the appointment   She has not established with a PCP  LABS:  Lab on 08/06/2021  Component Date Value Ref Range Status   Fructosamine 08/06/2021 215  0 - 285 umol/L Final   Comment: Published reference interval for apparently healthy subjects between age 15 and 69 is 57 - 285 umol/L  and in a poorly controlled diabetic population is 228 - 563 umol/L with a mean of 396 umol/L.    Sodium 08/06/2021 138  135 - 145 mEq/L Final   Potassium 08/06/2021 4.8  3.5 - 5.1 mEq/L Final   Chloride 08/06/2021 101  96 - 112 mEq/L Final   CO2 08/06/2021 31  19 - 32 mEq/L Final   Glucose, Bld 08/06/2021 103 (H)  70 - 99 mg/dL Final   BUN 08/06/2021 9  6 - 23 mg/dL Final   Creatinine, Ser 08/06/2021 0.74  0.40 - 1.20 mg/dL Final   GFR 08/06/2021 83.82  >60.00 mL/min Final   Calculated using the CKD-EPI Creatinine Equation (2021)   Calcium 08/06/2021 10.2  8.4 - 10.5 mg/dL Final    Physical Examination:  BP 122/74 (Patient Position: Standing)    Pulse 75    Ht 4\' 11"  (1.499 m)    Wt 124 lb 12.8 oz (56.6 kg)    SpO2 99%    BMI 25.21 kg/m     ASSESSMENT:  Diabetes type 2, non-insulin requiring  See history of present illness for discussion of current diabetes management, blood sugar patterns and problems identified  A1c is 7.4 previously but fructosamine is now excellent at 215  She is on 300 mg, metformin 2 g daily, Trulicity 1.5 mg weekly and Precose 25 mg daily  Fructosamine and recent home blood sugars show improved control She has also lost weight with Trulicity    HYPERTENSION: Blood pressure is controlled with 10 mg lisinopril  PLAN:   She will continue Trulicity but at lunch and dinner will stop acarbose More blood sugar monitoring after dinner Continue small frequent meals If she continues to have any GI side effects may consider reducing metformin or going back to 5.88 mg Trulicity  Again reminded her to establish with a PCP  Patient Instructions  Check blood sugars on waking up 2-3 days a week  Also check blood sugars about 2 hours after meals and do this after different meals by rotation  Recommended blood sugar levels on waking up are 90-130 and about 2 hours after meal is 130-160  Please bring your blood sugar monitor to each visit, thank  you  Acarbose only in am  Elayne Snare 08/07/2021, 9:20 AM    Note: This office note was prepared with Dragon voice recognition system technology. Any transcriptional errors that result from this process are unintentional.

## 2021-08-12 ENCOUNTER — Other Ambulatory Visit: Payer: Self-pay | Admitting: Endocrinology

## 2021-09-10 ENCOUNTER — Other Ambulatory Visit: Payer: Self-pay | Admitting: Endocrinology

## 2021-09-13 ENCOUNTER — Other Ambulatory Visit: Payer: Self-pay | Admitting: Endocrinology

## 2021-09-13 ENCOUNTER — Other Ambulatory Visit: Payer: Self-pay | Admitting: Cardiology

## 2021-11-04 ENCOUNTER — Other Ambulatory Visit: Payer: Medicaid Other

## 2021-11-07 ENCOUNTER — Other Ambulatory Visit: Payer: Self-pay | Admitting: Endocrinology

## 2021-11-07 ENCOUNTER — Other Ambulatory Visit: Payer: Medicaid Other

## 2021-11-07 ENCOUNTER — Ambulatory Visit: Payer: Medicaid Other | Admitting: Endocrinology

## 2021-11-25 ENCOUNTER — Other Ambulatory Visit (INDEPENDENT_AMBULATORY_CARE_PROVIDER_SITE_OTHER): Payer: Medicaid Other

## 2021-11-25 DIAGNOSIS — E782 Mixed hyperlipidemia: Secondary | ICD-10-CM | POA: Diagnosis not present

## 2021-11-25 DIAGNOSIS — E039 Hypothyroidism, unspecified: Secondary | ICD-10-CM | POA: Diagnosis not present

## 2021-11-25 DIAGNOSIS — E1140 Type 2 diabetes mellitus with diabetic neuropathy, unspecified: Secondary | ICD-10-CM

## 2021-11-25 LAB — LIPID PANEL
Cholesterol: 95 mg/dL (ref 0–200)
HDL: 34.3 mg/dL — ABNORMAL LOW (ref >39.00)
LDL Cholesterol: 48 mg/dL (ref 0–99)
NonHDL: 61.14
Total CHOL/HDL Ratio: 3
Triglycerides: 64 mg/dL (ref 0.0–149.0)
VLDL: 12.8 mg/dL (ref 0.0–40.0)

## 2021-11-25 LAB — COMPREHENSIVE METABOLIC PANEL
ALT: 11 U/L (ref 0–35)
AST: 15 U/L (ref 0–37)
Albumin: 4.3 g/dL (ref 3.5–5.2)
Alkaline Phosphatase: 39 U/L (ref 39–117)
BUN: 12 mg/dL (ref 6–23)
CO2: 27 mEq/L (ref 19–32)
Calcium: 9.8 mg/dL (ref 8.4–10.5)
Chloride: 103 mEq/L (ref 96–112)
Creatinine, Ser: 0.67 mg/dL (ref 0.40–1.20)
GFR: 90.36 mL/min (ref >60.00)
Glucose, Bld: 106 mg/dL — ABNORMAL HIGH (ref 70–99)
Potassium: 4.4 mEq/L (ref 3.5–5.1)
Sodium: 138 mEq/L (ref 135–145)
Total Bilirubin: 0.4 mg/dL (ref 0.2–1.2)
Total Protein: 6.7 g/dL (ref 6.0–8.3)

## 2021-11-25 LAB — HEMOGLOBIN A1C: Hgb A1c MFr Bld: 6.5 % (ref 4.6–6.5)

## 2021-11-25 LAB — MICROALBUMIN / CREATININE URINE RATIO
Creatinine,U: 64.5 mg/dL
Microalb Creat Ratio: 1.1 mg/g (ref 0.0–30.0)
Microalb, Ur: 0.7 mg/dL (ref 0.0–1.9)

## 2021-11-25 LAB — T4, FREE: Free T4: 1.26 ng/dL (ref 0.60–1.60)

## 2021-11-25 LAB — TSH: TSH: 2.24 u[IU]/mL (ref 0.35–5.50)

## 2021-11-29 ENCOUNTER — Ambulatory Visit (INDEPENDENT_AMBULATORY_CARE_PROVIDER_SITE_OTHER): Payer: Medicaid Other | Admitting: Endocrinology

## 2021-11-29 ENCOUNTER — Encounter: Payer: Self-pay | Admitting: Endocrinology

## 2021-11-29 VITALS — BP 124/70 | HR 81 | Ht 59.75 in | Wt 122.0 lb

## 2021-11-29 DIAGNOSIS — E782 Mixed hyperlipidemia: Secondary | ICD-10-CM

## 2021-11-29 DIAGNOSIS — I10 Essential (primary) hypertension: Secondary | ICD-10-CM | POA: Diagnosis not present

## 2021-11-29 DIAGNOSIS — E1140 Type 2 diabetes mellitus with diabetic neuropathy, unspecified: Secondary | ICD-10-CM

## 2021-11-29 DIAGNOSIS — G5603 Carpal tunnel syndrome, bilateral upper limbs: Secondary | ICD-10-CM | POA: Diagnosis not present

## 2021-11-29 DIAGNOSIS — E063 Autoimmune thyroiditis: Secondary | ICD-10-CM | POA: Diagnosis not present

## 2021-11-29 LAB — URINALYSIS, ROUTINE W REFLEX MICROSCOPIC
Bilirubin Urine: NEGATIVE
Ketones, ur: NEGATIVE
Leukocytes,Ua: NEGATIVE
Nitrite: NEGATIVE
Specific Gravity, Urine: <=1.005 — AB (ref 1.000–1.030)
Total Protein, Urine: NEGATIVE
Urine Glucose: >=1000 — AB
Urobilinogen, UA: 0.2 (ref 0.0–1.0)
pH: 6 (ref 5.0–8.0)

## 2021-11-29 MED ORDER — TRULICITY 0.75 MG/0.5ML ~~LOC~~ SOAJ: SUBCUTANEOUS | 5 refills | Status: DC

## 2021-11-29 NOTE — Progress Notes (Unsigned)
Patient ID: [name] [name], female   DOB: May 28, 1956, 66 y.o.   MRN: [id number]           Reason for Appointment: Follow-up    History of Present Illness:          Diagnosis: Type 2 diabetes mellitus, date of diagnosis: ?  2014   Past history:   Her son thinks that she may have had diabetes diagnosed when she was in [place] When she moved to [facility] in 2014 she was not on any medications She was however found to have significant hyperglycemia with glucose 364 and A1c of 11.7 when she was admitted for her coronary artery disease in 04/2013. At that time she was started on premixed insulin twice a day Her level of control had not been adequate with persistently high A1c readings She did not tolerate Victoza well and was having decreased appetite, malaise and nausea and this was stopped subsequently  Recent history:        Oral hypoglycemic drugs:     Metformin ER 500 mg 4 tablets daily, Jardiance 25 mg daily,  Trulicity 1.5 mg weekly  Her A1c had been higher at 7.4 on her last visit  Current blood sugar patterns and problems: Because of higher A1c and likely postprandial hyperglycemia she was started on TRULICITY in December On 07/12/2021 this was increased up to 1.5 mg from 0.75 mg weekly She also was switched from Invokana to [place] since she thought it was causing nausea  However even though she was supposed to stop Precose she is still taking  She does not complain of gaseousness and diarrhea as much now However if she is eating more than a small portion she will tend to have some nausea and is restricting her portions  She thinks she is eating mostly 3 small meals a day With this she has lost 7 pounds, current BMI is 25 Most of her blood sugars are down in the morning and not many after meals  Fasting lab glucose was 103  She is usually eating a rice preparation for her breakfast without any protein; she does not eat eggs, does not like cheese and does not tolerate  yogurt As before she is not able to do much exercise   Side effects from medications have been: None  Self-care: The diet that the patient has been following is: None, has vegetarian diet Usually having vegetables and roti with lunch and dinner and sometimes having lentils.   She does not like yogurt as it apparently causes heartburn  Usually not eating fried food                  Dietician visit, none   Glucose monitoring:  done up to 2 times a day       Glucometer: Accu-Chek  Blood Glucose readings from download   PRE-MEAL Fasting Lunch Dinner Bedtime Overall  Glucose range:       Mean/median:        POST-MEAL PC Breakfast PC Lunch PC Dinner  Glucose range:     Mean/median:        PRE-MEAL Mornings Lunch Dinner Bedtime Overall  Glucose range: 98-116 95-106 81 89, 152   Mean/median:     107   POST-MEAL PC Breakfast PC Lunch PC Dinner  Glucose range:  ?   Mean/median:        Previously:  PRE-MEAL Fasting Lunch Dinner Bedtime Overall  Glucose range:     118-184  Mean/median: 125  141/137   POST-MEAL PC Breakfast PC Lunch PC Dinner  Glucose range:     Mean/median: 156 134 151     Weight history:   Wt Readings from Last 3 Encounters:  11/29/21 122 lb (55.3 kg)  08/07/21 124 lb 12.8 oz (56.6 kg)  07/23/21 126 lb (57.2 kg)    Glycemic control:   Lab Results  Component Value Date   HGBA1C 6.5 11/25/2021   HGBA1C 7.4 (H) 06/03/2021   HGBA1C 7.4 (H) 12/05/2020   Lab Results  Component Value Date   MICROALBUR 0.7 11/25/2021   LDLCALC 48 11/25/2021   CREATININE 0.67 11/25/2021    Lab on 11/25/2021  Component Date Value Ref Range Status   Free T4 11/25/2021 1.26  0.60 - 1.60 ng/dL Final   Comment: Specimens from patients who are undergoing biotin therapy and /or ingesting biotin supplements may contain high levels of biotin.  The higher biotin concentration in these specimens interferes with this Free T4 assay.  Specimens that contain high levels   of biotin may cause false high results for this Free T4 assay.  Please interpret results in light of the total clinical presentation of the patient.     TSH 11/25/2021 2.24  0.35 - 5.50 uIU/mL Final   Cholesterol 11/25/2021 95  0 - 200 mg/dL Final   ATP III Classification       Desirable:  < 200 mg/dL               Borderline High:  200 - 239 mg/dL          High:  > = 240 mg/dL   Triglycerides 11/25/2021 64.0  0.0 - 149.0 mg/dL Final   Normal:  <150 mg/dLBorderline High:  150 - 199 mg/dL   HDL 11/25/2021 34.30 (L)  >39.00 mg/dL Final   VLDL 11/25/2021 12.8  0.0 - 40.0 mg/dL Final   LDL Cholesterol 11/25/2021 48  0 - 99 mg/dL Final   Total CHOL/HDL Ratio 11/25/2021 3   Final                  Men          Women1/2 Average Risk     3.4          3.3Average Risk          5.0          4.42X Average Risk          9.6          7.13X Average Risk          15.0          11.0                       NonHDL 11/25/2021 61.14   Final   NOTE:  Non-HDL goal should be 30 mg/dL higher than patient's LDL goal (i.e. LDL goal of < 70 mg/dL, would have non-HDL goal of < 100 mg/dL)   Microalb, Ur 11/25/2021 0.7  0.0 - 1.9 mg/dL Final   Creatinine,U 11/25/2021 64.5  mg/dL Final   Microalb Creat Ratio 11/25/2021 1.1  0.0 - 30.0 mg/g Final   Sodium 11/25/2021 138  135 - 145 mEq/L Final   Potassium 11/25/2021 4.4  3.5 - 5.1 mEq/L Final   Chloride 11/25/2021 103  96 - 112 mEq/L Final   CO2 11/25/2021 27  19 - 32 mEq/L Final   Glucose, Bld 11/25/2021 106 (H)  70 -  99 mg/dL Final   BUN 11/25/2021 12  6 - 23 mg/dL Final   Creatinine, Ser 11/25/2021 0.67  0.40 - 1.20 mg/dL Final   Total Bilirubin 11/25/2021 0.4  0.2 - 1.2 mg/dL Final   Alkaline Phosphatase 11/25/2021 39  39 - 117 U/L Final   AST 11/25/2021 15  0 - 37 U/L Final   ALT 11/25/2021 11  0 - 35 U/L Final   Total Protein 11/25/2021 6.7  6.0 - 8.3 g/dL Final   Albumin 11/25/2021 4.3  3.5 - 5.2 g/dL Final   GFR 11/25/2021 90.36  >60.00 mL/min Final    Calculated using the CKD-EPI Creatinine Equation (2021)   Calcium 11/25/2021 9.8  8.4 - 10.5 mg/dL Final   Hgb A1c MFr Bld 11/25/2021 6.5  4.6 - 6.5 % Final   Glycemic Control Guidelines for People with Diabetes:Non Diabetic:  <6%Goal of Therapy: <7%Additional Action Suggested:  >8%       Allergies as of 11/29/2021   No Known Allergies      Medication List        Accurate as of November 29, 2021  9:54 AM. If you have any questions, ask your nurse or doctor.          acarbose 25 MG tablet Commonly known as: PRECOSE TAKE 1 TABLET BY MOUTH AT BREAKFAST, THEN TAKE 2 TABLETS BY MOUTH AT [facility] AND 1 TABLET BY MOUTH AT DINNER   Accu-Chek FastClix Lancets Misc USE TO CHECK BLOOD SUGAR 3 TIMES PER DAY   Accu-Chek Guide test strip Generic drug: glucose blood Use to check blood sugar 3 times a day   Accu-Chek Guide w/Device Kit Use to check blood sugar 3 times a day.   aspirin EC 81 MG tablet Take 1 tablet (81 mg total) by mouth daily.   atorvastatin 80 MG tablet Commonly known as: LIPITOR TAKE 1 TABLET (80 MG TOTAL) BY MOUTH DAILY AT 6 PM.   gabapentin 100 MG capsule Commonly known as: NEURONTIN TAKE 2 CAPSULES (200 MG TOTAL) BY MOUTH AT BEDTIME.   Jardiance 25 MG Tabs tablet Generic drug: empagliflozin TAKE 1 TABLET (25 MG TOTAL) BY MOUTH DAILY BEFORE BREAKFAST.   levothyroxine 50 MCG tablet Commonly known as: SYNTHROID TAKE 1 TABLET (50 MCG TOTAL) BY MOUTH DAILY.   lisinopril 20 MG tablet Commonly known as: ZESTRIL TAKE 1 TABLET (20 MG TOTAL) BY MOUTH DAILY.   metFORMIN 500 MG 24 hr tablet Commonly known as: GLUCOPHAGE-XR TAKE 4 TABLETS BY MOUTH DAILY WITH SUPPER   metoprolol tartrate 25 MG tablet Commonly known as: LOPRESSOR TAKE 1/2 TABLET (12.5 MG TOTAL) BY MOUTH 2 (TWO) TIMES DAILY.   nitroGLYCERIN 0.4 MG SL tablet Commonly known as: NITROSTAT Place 1 tablet (0.4 mg total) under the tongue every 5 (five) minutes as needed for chest pain.   ondansetron 4  MG disintegrating tablet Commonly known as: Zofran ODT Take 1 tablet (4 mg total) by mouth every 8 (eight) hours as needed for nausea or vomiting.   pantoprazole 40 MG tablet Commonly known as: PROTONIX TAKE 1 TABLET (40 MG TOTAL) BY MOUTH DAILY.   Trulicity 1.5 ZO/1.0RU Sopn Generic drug: Dulaglutide INJECT CONTENTS OF 1 PEN WEEKLY        Allergies: No Known Allergies  Past Medical History:  Diagnosis Date   Anemia    CAD (coronary artery disease)    a. 04/2013 Inf STEMI/PCI: LM nl, LAD min irregs, D1/2/3 nl, LCX 2m (2.5x18 Xience DES), OM1/2/3 nl, RCA nl, PDA  nl, RPAV nl, RPL nl, EF 55%;  b. 04/2013 Echo: EF 55-60%, no rwma, mild MR, mildly dil LA.   Diabetes mellitus type 2, uncontrolled    a. 04/2013 HbA1c = 11.7.   GERD (gastroesophageal reflux disease)    Hyperlipidemia    Hypertension    Hypothyroidism     Past Surgical History:  Procedure Laterality Date   CESAREAN SECTION     LEFT HEART CATH  05-15-13   LEFT HEART CATHETERIZATION WITH CORONARY ANGIOGRAM N/A 05/15/2013   Procedure: LEFT HEART CATHETERIZATION WITH CORONARY ANGIOGRAM;  Surgeon: Wellington Hampshire, MD;  Location: [place] CATH LAB;  Service: Cardiovascular;  Laterality: N/A;   PERCUTANEOUS STENT INTERVENTION  05/15/2013   Procedure: PERCUTANEOUS STENT INTERVENTION;  Surgeon: Wellington Hampshire, MD;  Location: [place] CATH LAB;  Service: Cardiovascular;;    Family History  Problem Relation Age of Onset   Heart disease Mother    Diabetes Mother    Hypertension Mother    Heart disease Father    Diabetes Father    Hypertension Father    Diabetes Brother     Social History:  reports that she has never smoked. She has never used smokeless tobacco. She reports that she does not drink alcohol and does not use drugs.    Review of Systems   HYPOTHYROIDISM: Screening TSH had been done because of her complaints of carpal tunnel syndrome and some fatigue Because of her high TSH she has been on levothyroxine 25  mcg daily He takes this regularly before breakfast and has not missed any doses lately  In 4/22 was having symptoms of hair loss and also complaining of some fatigue With taking 50 mcg levothyroxine for symptoms and improved Subsequent TSH level has been normal  No history of goiter  Last TSH:  Lab Results  Component Value Date   TSH 2.24 11/25/2021   TSH 2.20 06/03/2021   TSH 2.87 12/05/2020   FREET4 1.26 11/25/2021   FREET4 0.93 10/01/2020   FREET4 0.96 06/20/2019     NEUROPATHY: She has  symptoms of numbness and some tingling in her hands and usually when waking up Has only minimal pains in her feet with some burning She is now complaining about the numbness in her hands and tingling during the night despite taking gabapentin  Symptoms are improved with gabapentin  Lipids: She is treated with Lipitor 80 mg for several years Lipids are well controlled, has history of CAD followed by cardiologist      Lab Results  Component Value Date   CHOL 95 11/25/2021   HDL 34.30 (L) 11/25/2021   LDLCALC 48 11/25/2021   TRIG 64.0 11/25/2021   CHOLHDL 3 11/25/2021                 HYPERTENSION   The blood pressure has been controlled with lisinopril 10 mg    BP Readings from Last 3 Encounters:  11/29/21 124/70  08/07/21 122/74  07/23/21 110/66   She had a screening B12 level done because of neuropathic symptoms and long-term Metformin Had a low level at baseline and has been supplemented  Lab Results  Component Value Date   VITAMINB12 >1500 (H) 09/26/2019    No history of anemia  Lab Results  Component Value Date   HGB 13.4 03/21/2020   \   She has not established with a PCP  LABS:  Lab on 11/25/2021  Component Date Value Ref Range Status   Free T4 11/25/2021 1.26  0.60 -  1.60 ng/dL Final   Comment: Specimens from patients who are undergoing biotin therapy and /or ingesting biotin supplements may contain high levels of biotin.  The higher biotin  concentration in these specimens interferes with this Free T4 assay.  Specimens that contain high levels  of biotin may cause false high results for this Free T4 assay.  Please interpret results in light of the total clinical presentation of the patient.     TSH 11/25/2021 2.24  0.35 - 5.50 uIU/mL Final   Cholesterol 11/25/2021 95  0 - 200 mg/dL Final   ATP III Classification       Desirable:  < 200 mg/dL               Borderline High:  200 - 239 mg/dL          High:  > = 240 mg/dL   Triglycerides 11/25/2021 64.0  0.0 - 149.0 mg/dL Final   Normal:  <150 mg/dLBorderline High:  150 - 199 mg/dL   HDL 11/25/2021 34.30 (L)  >39.00 mg/dL Final   VLDL 11/25/2021 12.8  0.0 - 40.0 mg/dL Final   LDL Cholesterol 11/25/2021 48  0 - 99 mg/dL Final   Total CHOL/HDL Ratio 11/25/2021 3   Final                  Men          Women1/2 Average Risk     3.4          3.3Average Risk          5.0          4.42X Average Risk          9.6          7.13X Average Risk          15.0          11.0                       NonHDL 11/25/2021 61.14   Final   NOTE:  Non-HDL goal should be 30 mg/dL higher than patient's LDL goal (i.e. LDL goal of < 70 mg/dL, would have non-HDL goal of < 100 mg/dL)   Microalb, Ur 11/25/2021 0.7  0.0 - 1.9 mg/dL Final   Creatinine,U 11/25/2021 64.5  mg/dL Final   Microalb Creat Ratio 11/25/2021 1.1  0.0 - 30.0 mg/g Final   Sodium 11/25/2021 138  135 - 145 mEq/L Final   Potassium 11/25/2021 4.4  3.5 - 5.1 mEq/L Final   Chloride 11/25/2021 103  96 - 112 mEq/L Final   CO2 11/25/2021 27  19 - 32 mEq/L Final   Glucose, Bld 11/25/2021 106 (H)  70 - 99 mg/dL Final   BUN 11/25/2021 12  6 - 23 mg/dL Final   Creatinine, Ser 11/25/2021 0.67  0.40 - 1.20 mg/dL Final   Total Bilirubin 11/25/2021 0.4  0.2 - 1.2 mg/dL Final   Alkaline Phosphatase 11/25/2021 39  39 - 117 U/L Final   AST 11/25/2021 15  0 - 37 U/L Final   ALT 11/25/2021 11  0 - 35 U/L Final   Total Protein 11/25/2021 6.7  6.0 - 8.3 g/dL Final    Albumin 11/25/2021 4.3  3.5 - 5.2 g/dL Final   GFR 11/25/2021 90.36  >60.00 mL/min Final   Calculated using the CKD-EPI Creatinine Equation (2021)   Calcium 11/25/2021 9.8  8.4 - 10.5 mg/dL Final   Hgb A1c MFr  Bld 11/25/2021 6.5  4.6 - 6.5 % Final   Glycemic Control Guidelines for People with Diabetes:Non Diabetic:  <6%Goal of Therapy: <7%Additional Action Suggested:  >8%     Physical Examination:  BP 124/70   Pulse 81   Ht 4' 11.75" (1.518 m)   Wt 122 lb (55.3 kg)   SpO2 99%   BMI 24.03 kg/m     ASSESSMENT:  Diabetes type 2, non-insulin requiring  See history of present illness for discussion of current diabetes management, blood sugar patterns and problems identified  A1c is 6.5  She is on 300 mg, metformin 2 g daily, Trulicity 1.5 mg weekly and Precose 25 mg daily  Fructosamine and recent home blood sugars show improved control She has also lost weight with Trulicity    HYPERTENSION: Blood pressure is controlled with 10 mg lisinopril  PLAN:    She will continue Trulicity but at lunch and dinner will stop acarbose More blood sugar monitoring after dinner Continue small frequent meals If she continues to have any GI side effects may consider reducing metformin or going back to 4.45 mg Trulicity  Again reminded her to establish with a PCP  There are no Patient Instructions on file for this visit.  Elayne Snare 11/29/2021, 9:54 AM    Note: This office note was prepared with Dragon voice recognition system technology. Any transcriptional errors that result from this process are unintentional.

## 2021-11-30 ENCOUNTER — Encounter: Payer: Self-pay | Admitting: Endocrinology

## 2021-11-30 MED ORDER — FLUCONAZOLE 150 MG PO TABS: 150.0000 mg | ORAL_TABLET | Freq: Once | ORAL | 0 refills | Status: AC

## 2021-11-30 MED ORDER — MICONAZOLE NITRATE 2 % VA CREA: 1.0000 | TOPICAL_CREAM | Freq: Every day | VAGINAL | 0 refills | Status: AC

## 2021-12-31 ENCOUNTER — Other Ambulatory Visit: Payer: Self-pay | Admitting: Endocrinology

## 2022-01-29 DIAGNOSIS — G5613 Other lesions of median nerve, bilateral upper limbs: Secondary | ICD-10-CM | POA: Diagnosis not present

## 2022-02-05 DIAGNOSIS — G5603 Carpal tunnel syndrome, bilateral upper limbs: Secondary | ICD-10-CM | POA: Diagnosis not present

## 2022-02-14 ENCOUNTER — Other Ambulatory Visit: Payer: Self-pay | Admitting: Endocrinology

## 2022-03-14 ENCOUNTER — Other Ambulatory Visit: Payer: Self-pay | Admitting: Endocrinology

## 2022-03-21 ENCOUNTER — Other Ambulatory Visit (INDEPENDENT_AMBULATORY_CARE_PROVIDER_SITE_OTHER): Payer: Medicaid Other

## 2022-03-21 DIAGNOSIS — E1140 Type 2 diabetes mellitus with diabetic neuropathy, unspecified: Secondary | ICD-10-CM | POA: Diagnosis not present

## 2022-03-21 LAB — BASIC METABOLIC PANEL
BUN: 12 mg/dL (ref 6–23)
CO2: 28 mEq/L (ref 19–32)
Calcium: 9.9 mg/dL (ref 8.4–10.5)
Chloride: 104 mEq/L (ref 96–112)
Creatinine, Ser: 0.76 mg/dL (ref 0.40–1.20)
GFR: 80.83 mL/min (ref >60.00)
Glucose, Bld: 115 mg/dL — ABNORMAL HIGH (ref 70–99)
Potassium: 4.6 mEq/L (ref 3.5–5.1)
Sodium: 140 mEq/L (ref 135–145)

## 2022-03-21 LAB — HEMOGLOBIN A1C: Hgb A1c MFr Bld: 6.7 % — ABNORMAL HIGH (ref 4.6–6.5)

## 2022-03-26 ENCOUNTER — Ambulatory Visit: Payer: Medicaid Other | Admitting: Endocrinology

## 2022-03-26 ENCOUNTER — Encounter: Payer: Self-pay | Admitting: Endocrinology

## 2022-03-26 VITALS — BP 138/84 | HR 66 | Wt 121.8 lb

## 2022-03-26 DIAGNOSIS — E039 Hypothyroidism, unspecified: Secondary | ICD-10-CM

## 2022-03-26 DIAGNOSIS — E1165 Type 2 diabetes mellitus with hyperglycemia: Secondary | ICD-10-CM | POA: Diagnosis not present

## 2022-03-26 DIAGNOSIS — E782 Mixed hyperlipidemia: Secondary | ICD-10-CM

## 2022-03-26 DIAGNOSIS — I10 Essential (primary) hypertension: Secondary | ICD-10-CM | POA: Diagnosis not present

## 2022-03-26 NOTE — Progress Notes (Signed)
Patient ID: [name] [name], female   DOB: September 13, 1955, 66 y.o.   MRN: [id number]           Reason for Appointment: Follow-up    History of Present Illness:          Diagnosis: Type 2 diabetes mellitus, date of diagnosis: ?  2014   Past history:   Her son thinks that she may have had diabetes diagnosed when she was in [place] When she moved to [facility] in 2014 she was not on any medications She was however found to have significant hyperglycemia with glucose 364 and A1c of 11.7 when she was admitted for her coronary artery disease in 04/2013. At that time she was started on premixed insulin twice a day Her level of control had not been adequate with persistently high A1c readings She did not tolerate Victoza well and was having decreased appetite, malaise and nausea and this was stopped subsequently  Recent history:        Oral hypoglycemic drugs:     Metformin ER 500 mg 4 tablets daily, Jardiance 25 mg daily,  Trulicity 3.81 mg weekly acarbose 25 mg daily  Her A1c is now 6.7 compared to 6.5  Current blood sugar patterns and problems: Trulicity was reduced in June because of significant decrease in appetite and some anorexia and weight loss She is having a fairly normal appetite now with 0.17 mg Trulicity dose Weight is about the same Blood sugars are still excellent with recent blood sugar range 98-144 Again not able to exercise much Blood sugars are not being monitored consistently after dinner She has not had any other issues with vaginal candidiasis Also takes acarbose consistently without side effects   Side effects from medications have been: None  Self-care: The diet that the patient has been following is: None, has vegetarian diet Usually having vegetables and roti with lunch and dinner and sometimes having lentils.   She does not like yogurt as it apparently causes heartburn  Usually not eating fried food                  Dietician visit, none   Glucose  monitoring:  done up to 2 times a day       Glucometer: Accu-Chek  Blood Glucose readings from review as above  Fasting range 99-129 with overall average 112  Previous average 114 for the last 30 days   Weight history:   Wt Readings from Last 3 Encounters:  03/26/22 121 lb 12.8 oz (55.2 kg)  11/29/21 122 lb (55.3 kg)  08/07/21 124 lb 12.8 oz (56.6 kg)    Glycemic control:   Lab Results  Component Value Date   HGBA1C 6.7 (H) 03/21/2022   HGBA1C 6.5 11/25/2021   HGBA1C 7.4 (H) 06/03/2021   Lab Results  Component Value Date   MICROALBUR 0.7 11/25/2021   LDLCALC 48 11/25/2021   CREATININE 0.76 03/21/2022    Lab on 03/21/2022  Component Date Value Ref Range Status   Sodium 03/21/2022 140  135 - 145 mEq/L Final   Potassium 03/21/2022 4.6  3.5 - 5.1 mEq/L Final   Chloride 03/21/2022 104  96 - 112 mEq/L Final   CO2 03/21/2022 28  19 - 32 mEq/L Final   Glucose, Bld 03/21/2022 115 (H)  70 - 99 mg/dL Final   BUN 03/21/2022 12  6 - 23 mg/dL Final   Creatinine, Ser 03/21/2022 0.76  0.40 - 1.20 mg/dL Final   GFR 03/21/2022 80.83  >60.00  mL/min Final   Calculated using the CKD-EPI Creatinine Equation (2021)   Calcium 03/21/2022 9.9  8.4 - 10.5 mg/dL Final   Hgb A1c MFr Bld 03/21/2022 6.7 (H)  4.6 - 6.5 % Final   Glycemic Control Guidelines for People with Diabetes:Non Diabetic:  <6%Goal of Therapy: <7%Additional Action Suggested:  >8%       Allergies as of 03/26/2022   No Known Allergies      Medication List        Accurate as of March 26, 2022  8:23 AM. If you have any questions, ask your nurse or doctor.          acarbose 25 MG tablet Commonly known as: PRECOSE TAKE 1 TABLET BY MOUTH AT BREAKFAST, THEN TAKE 2 TABLETS BY MOUTH AT [facility] AND 1 TABLET BY MOUTH AT DINNER   Accu-Chek FastClix Lancets Misc USE TO CHECK BLOOD SUGAR 3 TIMES PER DAY   Accu-Chek Guide test strip Generic drug: glucose blood Use to check blood sugar 3 times a day   Accu-Chek Guide  w/Device Kit Use to check blood sugar 3 times a day.   aspirin EC 81 MG tablet Take 1 tablet (81 mg total) by mouth daily.   atorvastatin 80 MG tablet Commonly known as: LIPITOR TAKE 1 TABLET (80 MG TOTAL) BY MOUTH DAILY AT 6 PM.   gabapentin 100 MG capsule Commonly known as: NEURONTIN TAKE 2 CAPSULES (200 MG TOTAL) BY MOUTH AT BEDTIME.   Jardiance 25 MG Tabs tablet Generic drug: empagliflozin TAKE 1 TABLET (25 MG TOTAL) BY MOUTH DAILY BEFORE BREAKFAST.   levothyroxine 50 MCG tablet Commonly known as: SYNTHROID TAKE 1 TABLET (50 MCG TOTAL) BY MOUTH DAILY.   lisinopril 20 MG tablet Commonly known as: ZESTRIL TAKE 1 TABLET (20 MG TOTAL) BY MOUTH DAILY.   metFORMIN 500 MG 24 hr tablet Commonly known as: GLUCOPHAGE-XR TAKE 4 TABLETS BY MOUTH DAILY WITH SUPPER   metoprolol tartrate 25 MG tablet Commonly known as: LOPRESSOR TAKE 1/2 TABLET (12.5 MG TOTAL) BY MOUTH 2 (TWO) TIMES DAILY.   miconazole 2 % vaginal cream Commonly known as: Monistat 7 Simply Cure Place 1 Applicatorful vaginally at bedtime.   nitroGLYCERIN 0.4 MG SL tablet Commonly known as: NITROSTAT Place 1 tablet (0.4 mg total) under the tongue every 5 (five) minutes as needed for chest pain.   ondansetron 4 MG disintegrating tablet Commonly known as: Zofran ODT Take 1 tablet (4 mg total) by mouth every 8 (eight) hours as needed for nausea or vomiting.   pantoprazole 40 MG tablet Commonly known as: PROTONIX TAKE 1 TABLET (40 MG TOTAL) BY MOUTH DAILY.   Trulicity 2.40 XB/3.5HG Sopn Generic drug: Dulaglutide Inject in the abdominal skin as directed once a week        Allergies: No Known Allergies  Past Medical History:  Diagnosis Date   Anemia    CAD (coronary artery disease)    a. 04/2013 Inf STEMI/PCI: LM nl, LAD min irregs, D1/2/3 nl, LCX 19m(2.5x18 Xience DES), OM1/2/3 nl, RCA nl, PDA nl, RPAV nl, RPL nl, EF 55%;  b. 04/2013 Echo: EF 55-60%, no rwma, mild MR, mildly dil LA.   Diabetes mellitus  type 2, uncontrolled    a. 04/2013 HbA1c = 11.7.   GERD (gastroesophageal reflux disease)    Hyperlipidemia    Hypertension    Hypothyroidism     Past Surgical History:  Procedure Laterality Date   CESAREAN SECTION     LEFT HEART CATH  05-15-13  LEFT HEART CATHETERIZATION WITH CORONARY ANGIOGRAM N/A 05/15/2013   Procedure: LEFT HEART CATHETERIZATION WITH CORONARY ANGIOGRAM;  Surgeon: Wellington Hampshire, MD;  Location: [place] CATH LAB;  Service: Cardiovascular;  Laterality: N/A;   PERCUTANEOUS STENT INTERVENTION  05/15/2013   Procedure: PERCUTANEOUS STENT INTERVENTION;  Surgeon: Wellington Hampshire, MD;  Location: [place] CATH LAB;  Service: Cardiovascular;;    Family History  Problem Relation Age of Onset   Heart disease Mother    Diabetes Mother    Hypertension Mother    Heart disease Father    Diabetes Father    Hypertension Father    Diabetes Brother     Social History:  reports that she has never smoked. She has never used smokeless tobacco. She reports that she does not drink alcohol and does not use drugs.    Review of Systems   HYPOTHYROIDISM: Screening TSH had been done because of her complaints of carpal tunnel syndrome and some fatigue Because of her high TSH she has been on levothyroxine 25 mcg daily He takes this regularly before breakfast and has not missed any doses lately  In 4/22 was having symptoms of hair loss and also complaining of some fatigue With taking 50 mcg levothyroxine for symptoms and improved Subsequent TSH level has been normal  No history of goiter  Last TSH:  Lab Results  Component Value Date   TSH 2.24 11/25/2021   TSH 2.20 06/03/2021   TSH 2.87 12/05/2020   FREET4 1.26 11/25/2021   FREET4 0.93 10/01/2020   FREET4 0.96 06/20/2019     NEUROPATHY: She has  symptoms of numbness and some tingling in her hands and usually when waking up Has only minimal pains in her feet with some burning Occasionally will have more symptoms of neuropathy  during the night  Symptoms are otherwise controlled with gabapentin  She has carpal tunnel syndrome but is wanting to wait to do an injection from her hand surgeon  Lipids: She is treated with Lipitor 80 mg for several years Lipids are well controlled, has history of CAD followed by cardiologist      Lab Results  Component Value Date   CHOL 95 11/25/2021   HDL 34.30 (L) 11/25/2021   LDLCALC 48 11/25/2021   TRIG 64.0 11/25/2021   CHOLHDL 3 11/25/2021                 HYPERTENSION   The blood pressure has been controlled with lisinopril 20 mg    BP Readings from Last 3 Encounters:  03/26/22 138/84  11/29/21 124/70  08/07/21 122/74   She had a screening B12 level done because of neuropathic symptoms and long-term Metformin Had a low level at baseline but currently is not taking any B12 supplements   Lab Results  Component Value Date   VITAMINB12 >1500 (H) 09/26/2019    No history of anemia  Lab Results  Component Value Date   HGB 13.4 03/21/2020      She has not established with a PCP  LABS:  Lab on 03/21/2022  Component Date Value Ref Range Status   Sodium 03/21/2022 140  135 - 145 mEq/L Final   Potassium 03/21/2022 4.6  3.5 - 5.1 mEq/L Final   Chloride 03/21/2022 104  96 - 112 mEq/L Final   CO2 03/21/2022 28  19 - 32 mEq/L Final   Glucose, Bld 03/21/2022 115 (H)  70 - 99 mg/dL Final   BUN 03/21/2022 12  6 - 23 mg/dL Final  Creatinine, Ser 03/21/2022 0.76  0.40 - 1.20 mg/dL Final   GFR 03/21/2022 80.83  >60.00 mL/min Final   Calculated using the CKD-EPI Creatinine Equation (2021)   Calcium 03/21/2022 9.9  8.4 - 10.5 mg/dL Final   Hgb A1c MFr Bld 03/21/2022 6.7 (H)  4.6 - 6.5 % Final   Glycemic Control Guidelines for People with Diabetes:Non Diabetic:  <6%Goal of Therapy: <7%Additional Action Suggested:  >8%     Physical Examination:  BP 138/84   Pulse 66   Wt 121 lb 12.8 oz (55.2 kg)   SpO2 99%   BMI 23.99 kg/m     ASSESSMENT:  Diabetes  type 2, non-insulin requiring  See history of present illness for discussion of current diabetes management, blood sugar patterns and problems identified  A1c is 6.7  She is on 300 mg, metformin 2 g daily, Trulicity 5.05 mg weekly and Precose 25 mg daily  Her home blood sugars show about the same average numbers with recent highest reading only 135  Even with reducing Trulicity her overall level of control is about the same and she does not have as much anorexia   HYPERTENSION: Blood pressure is usually fairly good with 10 mg lisinopril, generally better at home  Lipids: To be checked annually  HYPOTHYROIDISM: will need follow-up TSH on next visit  PLAN:    She will continue her Trulicity to 3.97 mg Encouraged her to be as active as possible She needs to blood sugars more after dinner  Continue monitoring blood pressure periodically at home and call if consistently high  Referral will be made for PCP   There are no Patient Instructions on file for this visit.  Elayne Snare 03/26/2022, 8:23 AM    Note: This office note was prepared with Dragon voice recognition system technology. Any transcriptional errors that result from this process are unintentional.

## 2022-03-26 NOTE — Patient Instructions (Signed)
B12 take 1000ug daily  Check blood sugars on waking up 2 days a week  Also check blood sugars about 2 hours after meals and do this after different meals by rotation  Recommended blood sugar levels on waking up are 90-130 and about 2 hours after meal is 130-160  Please bring your blood sugar monitor to each visit, thank you

## 2022-04-16 ENCOUNTER — Telehealth: Payer: Self-pay | Admitting: Endocrinology

## 2022-04-16 NOTE — Telephone Encounter (Signed)
New message    No providers in the office is acceptance [facility] [employer].   Unable to accept / schedule the referral

## 2022-04-18 ENCOUNTER — Other Ambulatory Visit: Payer: Self-pay | Admitting: Endocrinology

## 2022-04-18 DIAGNOSIS — E1140 Type 2 diabetes mellitus with diabetic neuropathy, unspecified: Secondary | ICD-10-CM

## 2022-05-19 ENCOUNTER — Other Ambulatory Visit: Payer: Self-pay | Admitting: Endocrinology

## 2022-06-11 ENCOUNTER — Other Ambulatory Visit: Payer: Self-pay | Admitting: Endocrinology

## 2022-07-24 ENCOUNTER — Other Ambulatory Visit: Payer: Self-pay | Admitting: Endocrinology

## 2022-07-24 ENCOUNTER — Other Ambulatory Visit: Payer: Self-pay | Admitting: Internal Medicine

## 2022-07-24 DIAGNOSIS — I10 Essential (primary) hypertension: Secondary | ICD-10-CM

## 2022-07-25 ENCOUNTER — Other Ambulatory Visit (INDEPENDENT_AMBULATORY_CARE_PROVIDER_SITE_OTHER): Payer: Medicaid Other

## 2022-07-25 DIAGNOSIS — E1165 Type 2 diabetes mellitus with hyperglycemia: Secondary | ICD-10-CM

## 2022-07-25 LAB — COMPREHENSIVE METABOLIC PANEL
ALT: 11 U/L (ref 0–35)
AST: 14 U/L (ref 0–37)
Albumin: 4.1 g/dL (ref 3.5–5.2)
Alkaline Phosphatase: 45 U/L (ref 39–117)
BUN: 10 mg/dL (ref 6–23)
CO2: 26 mEq/L (ref 19–32)
Calcium: 9.4 mg/dL (ref 8.4–10.5)
Chloride: 106 mEq/L (ref 96–112)
Creatinine, Ser: 0.64 mg/dL (ref 0.40–1.20)
GFR: 90.94 mL/min (ref >60.00)
Glucose, Bld: 118 mg/dL — ABNORMAL HIGH (ref 70–99)
Potassium: 4.5 mEq/L (ref 3.5–5.1)
Sodium: 141 mEq/L (ref 135–145)
Total Bilirubin: 0.5 mg/dL (ref 0.2–1.2)
Total Protein: 6.8 g/dL (ref 6.0–8.3)

## 2022-07-25 LAB — TSH: TSH: 1.47 u[IU]/mL (ref 0.35–5.50)

## 2022-07-25 LAB — HEMOGLOBIN A1C: Hgb A1c MFr Bld: 7 % — ABNORMAL HIGH (ref 4.6–6.5)

## 2022-07-29 ENCOUNTER — Ambulatory Visit: Payer: Medicaid Other | Admitting: Endocrinology

## 2022-07-29 ENCOUNTER — Encounter: Payer: Self-pay | Admitting: Endocrinology

## 2022-07-29 VITALS — BP 124/72 | HR 78 | Ht 59.75 in | Wt 123.8 lb

## 2022-07-29 DIAGNOSIS — E782 Mixed hyperlipidemia: Secondary | ICD-10-CM

## 2022-07-29 DIAGNOSIS — E039 Hypothyroidism, unspecified: Secondary | ICD-10-CM | POA: Diagnosis not present

## 2022-07-29 DIAGNOSIS — E1165 Type 2 diabetes mellitus with hyperglycemia: Secondary | ICD-10-CM

## 2022-07-29 NOTE — Progress Notes (Signed)
Patient ID: [name] [name], female   DOB: 1955/11/27, 67 y.o.   MRN: [id number]           Reason for Appointment: Follow-up    History of Present Illness:          Diagnosis: Type 2 diabetes mellitus, date of diagnosis: ?  2014   Past history:   Her son thinks that she may have had diabetes diagnosed when she was in [place] When she moved to [facility] in 2014 she was not on any medications She was however found to have significant hyperglycemia with glucose 364 and A1c of 11.7 when she was admitted for her coronary artery disease in 04/2013. At that time she was started on premixed insulin twice a day Her level of control had not been adequate with persistently high A1c readings She did not tolerate Victoza well and was having decreased appetite, malaise and nausea and this was stopped subsequently  Recent history:        Oral hypoglycemic drugs:     Metformin ER 500 mg 2 tablets daily, Jardiance 25 mg daily,  Trulicity [id number] mg weekly acarbose 25 mg daily  Her A1c is now 7, previously 6.7  Current blood sugar patterns and problems: She thinks she may have had higher readings over the last few weeks because of [place] and not watching her diet Also has not checked her blood sugars for at least 3 weeks since her meter was not available Although her blood sugars are not averaging high over the last 90 days she has had some readings as high as 162 Difficult to know which readings are after meals since meter was not able to be downloaded  Lately has not done any exercise  Taking Trulicity regularly without side effects and her appetite is fairly good Weight not changed Also doing well with Vania Rea which is preferred over Invokana on her insurance Also takes acarbose consistently at breakfast which she thinks is her larger carbohydrate meal   Side effects from medications have been: None  Self-care: The diet that the patient has been following is: None, has  vegetarian diet Usually having vegetables and roti with lunch and dinner and sometimes having lentils.   She does not like yogurt as it apparently causes heartburn  Usually not eating fried food                  Dietician visit, none   Glucose monitoring:  done up to 2 times a day       Glucometer: Accu-Chek  Blood Glucose readings from review as above, recent blood sugar RANGE 92-132 Blood sugars being checked at different times Meter not able to be downloaded today  Weight history:   Wt Readings from Last 3 Encounters:  07/29/22 123 lb 12.8 oz (56.2 kg)  03/26/22 121 lb 12.8 oz (55.2 kg)  11/29/21 122 lb (55.3 kg)    Glycemic control:   Lab Results  Component Value Date   HGBA1C 7.0 (H) 07/25/2022   HGBA1C 6.7 (H) 03/21/2022   HGBA1C 6.5 11/25/2021   Lab Results  Component Value Date   MICROALBUR 0.7 11/25/2021   LDLCALC 48 11/25/2021   CREATININE 0.64 07/25/2022    Lab on 07/25/2022  Component Date Value Ref Range Status   TSH 07/25/2022 1.47  0.35 - 5.50 uIU/mL Final   Sodium 07/25/2022 141  135 - 145 mEq/L Final   Potassium 07/25/2022 4.5  3.5 - 5.1 mEq/L Final   Chloride 07/25/2022  106  96 - 112 mEq/L Final   CO2 07/25/2022 26  19 - 32 mEq/L Final   Glucose, Bld 07/25/2022 118 (H)  70 - 99 mg/dL Final   BUN 07/25/2022 10  6 - 23 mg/dL Final   Creatinine, Ser 07/25/2022 0.64  0.40 - 1.20 mg/dL Final   Total Bilirubin 07/25/2022 0.5  0.2 - 1.2 mg/dL Final   Alkaline Phosphatase 07/25/2022 45  39 - 117 U/L Final   AST 07/25/2022 14  0 - 37 U/L Final   ALT 07/25/2022 11  0 - 35 U/L Final   Total Protein 07/25/2022 6.8  6.0 - 8.3 g/dL Final   Albumin 07/25/2022 4.1  3.5 - 5.2 g/dL Final   GFR 07/25/2022 90.94  >60.00 mL/min Final   Calculated using the CKD-EPI Creatinine Equation (2021)   Calcium 07/25/2022 9.4  8.4 - 10.5 mg/dL Final   Hgb A1c MFr Bld 07/25/2022 7.0 (H)  4.6 - 6.5 % Final   Glycemic Control Guidelines for People with Diabetes:Non Diabetic:   <6%Goal of Therapy: <7%Additional Action Suggested:  >8%       Allergies as of 07/29/2022   No Known Allergies      Medication List        Accurate as of July 29, 2022 11:13 AM. If you have any questions, ask your nurse or doctor.          acarbose 25 MG tablet Commonly known as: PRECOSE TAKE 1 TABLET BY MOUTH AT BREAKFAST, THEN TAKE 2 TABLETS BY MOUTH AT [facility] AND 1 TABLET BY MOUTH AT DINNER   Accu-Chek Guide test strip Generic drug: glucose blood USE TO CHECK BLOOD SUGAR 3 TIMES A DAY   Accu-Chek Guide w/Device Kit Use to check blood sugar 3 times a day.   Accu-Chek Softclix Lancets lancets USE TO CHECK BLOOD SUGAR 3 TIMES A DAY   aspirin EC 81 MG tablet Take 1 tablet (81 mg total) by mouth daily.   atorvastatin 80 MG tablet Commonly known as: LIPITOR TAKE 1 TABLET (80 MG TOTAL) BY MOUTH DAILY AT 6 PM.   gabapentin 100 MG capsule Commonly known as: NEURONTIN TAKE 2 CAPSULES (200 MG TOTAL) BY MOUTH AT BEDTIME.   Jardiance 25 MG Tabs tablet Generic drug: empagliflozin TAKE 1 TABLET (25 MG TOTAL) BY MOUTH DAILY BEFORE BREAKFAST.   levothyroxine 50 MCG tablet Commonly known as: SYNTHROID TAKE 1 TABLET (50 MCG TOTAL) BY MOUTH DAILY.   lisinopril 20 MG tablet Commonly known as: ZESTRIL TAKE 1 TABLET (20 MG TOTAL) BY MOUTH DAILY.   metFORMIN 500 MG 24 hr tablet Commonly known as: GLUCOPHAGE-XR TAKE 4 TABLETS BY MOUTH DAILY WITH SUPPER   metoprolol tartrate 25 MG tablet Commonly known as: LOPRESSOR TAKE 1/2 TABLET (12.5 MG TOTAL) BY MOUTH 2 (TWO) TIMES DAILY.   miconazole 2 % vaginal cream Commonly known as: Monistat 7 Simply Cure Place 1 Applicatorful vaginally at bedtime.   nitroGLYCERIN 0.4 MG SL tablet Commonly known as: NITROSTAT Place 1 tablet (0.4 mg total) under the tongue every 5 (five) minutes as needed for chest pain.   ondansetron 4 MG disintegrating tablet Commonly known as: Zofran ODT Take 1 tablet (4 mg total) by mouth every 8  (eight) hours as needed for nausea or vomiting.   pantoprazole 40 MG tablet Commonly known as: PROTONIX TAKE 1 TABLET (40 MG TOTAL) BY MOUTH DAILY.   Trulicity A999333 [id number] Sopn Generic drug: Dulaglutide INJECT 0.75 MG IN THE ABDOMINAL SKIN AS DIRECTED ONCE  A WEEK        Allergies: No Known Allergies  Past Medical History:  Diagnosis Date   Anemia    CAD (coronary artery disease)    a. 04/2013 Inf STEMI/PCI: LM nl, LAD min irregs, D1/2/3 nl, LCX 15m(2.5x18 Xience DES), OM1/2/3 nl, RCA nl, PDA nl, RPAV nl, RPL nl, EF 55%;  b. 04/2013 Echo: EF 55-60%, no rwma, mild MR, mildly dil LA.   Diabetes mellitus type 2, uncontrolled    a. 04/2013 HbA1c = 11.7.   GERD (gastroesophageal reflux disease)    Hyperlipidemia    Hypertension    Hypothyroidism     Past Surgical History:  Procedure Laterality Date   CESAREAN SECTION     LEFT HEART CATH  05-15-13   LEFT HEART CATHETERIZATION WITH CORONARY ANGIOGRAM N/A 05/15/2013   Procedure: LEFT HEART CATHETERIZATION WITH CORONARY ANGIOGRAM;  Surgeon: MWellington Hampshire MD;  [facility];  Service: Cardiovascular;  Laterality: N/A;   PERCUTANEOUS STENT INTERVENTION  05/15/2013   Procedure: PERCUTANEOUS STENT INTERVENTION;  Surgeon: MWellington Hampshire MD;  [place] [facility];  Service: Cardiovascular;;    Family History  Problem Relation Age of Onset   Heart disease Mother    Diabetes Mother    Hypertension Mother    Heart disease Father    Diabetes Father    Hypertension Father    Diabetes Brother     Social History:  reports that she has never smoked. She has never used smokeless tobacco. She reports that she does not drink alcohol and does not use drugs.    Review of Systems   HYPOTHYROIDISM: Screening TSH had been done because of her complaints of carpal tunnel syndrome and some fatigue Because of her high TSH she has been on levothyroxine 25 mcg daily  In 4/22 was having symptoms of hair loss and also  complaining of some fatigue With taking 50 mcg levothyroxine her symptoms improved Subsequent TSH level has been normal  No history of goiter  Last TSH:  Lab Results  Component Value Date   TSH 1.47 07/25/2022   TSH 2.24 11/25/2021   TSH 2.20 06/03/2021   FREET4 1.26 11/25/2021   FREET4 0.93 10/01/2020   FREET4 0.96 06/20/2019     NEUROPATHY: She has  symptoms of numbness and some tingling in her hands and usually when waking up Has no significant symptoms in her feet now Occasionally will have more symptoms of neuropathy during the night  Symptoms are controlled with gabapentin  She has carpal tunnel syndrome also  Lipids: She is treated with Lipitor 80 mg for several years Lipids are well controlled, has history of CAD followed by cardiologist      Lab Results  Component Value Date   CHOL 95 11/25/2021   HDL 34.30 (L) 11/25/2021   LDLCALC 48 11/25/2021   TRIG 64.0 11/25/2021   CHOLHDL 3 11/25/2021                 HYPERTENSION   The blood pressure has been controlled with lisinopril 20 mg    BP Readings from Last 3 Encounters:  07/29/22 124/72  03/26/22 138/84  11/29/21 124/70   She had a screening B12 level done because of neuropathic symptoms and long-term Metformin Had a low level at baseline and recommended B12 supplements   Lab Results  Component Value Date   VITAMINB12 >1500 (H) 09/26/2019    No history of anemia  Lab Results  Component Value Date  HGB 13.4 03/21/2020    She has not established with a PCP  LABS:  Lab on 07/25/2022  Component Date Value Ref Range Status   TSH 07/25/2022 1.47  0.35 - 5.50 uIU/mL Final   Sodium 07/25/2022 141  135 - 145 mEq/L Final   Potassium 07/25/2022 4.5  3.5 - 5.1 mEq/L Final   Chloride 07/25/2022 106  96 - 112 mEq/L Final   CO2 07/25/2022 26  19 - 32 mEq/L Final   Glucose, Bld 07/25/2022 118 (H)  70 - 99 mg/dL Final   BUN 07/25/2022 10  6 - 23 mg/dL Final   Creatinine, Ser 07/25/2022 0.64  0.40 -  1.20 mg/dL Final   Total Bilirubin 07/25/2022 0.5  0.2 - 1.2 mg/dL Final   Alkaline Phosphatase 07/25/2022 45  39 - 117 U/L Final   AST 07/25/2022 14  0 - 37 U/L Final   ALT 07/25/2022 11  0 - 35 U/L Final   Total Protein 07/25/2022 6.8  6.0 - 8.3 g/dL Final   Albumin 07/25/2022 4.1  3.5 - 5.2 g/dL Final   GFR 07/25/2022 90.94  >60.00 mL/min Final   Calculated using the CKD-EPI Creatinine Equation (2021)   Calcium 07/25/2022 9.4  8.4 - 10.5 mg/dL Final   Hgb A1c MFr Bld 07/25/2022 7.0 (H)  4.6 - 6.5 % Final   Glycemic Control Guidelines for People with Diabetes:Non Diabetic:  <6%Goal of Therapy: <7%Additional Action Suggested:  >8%     Physical Examination:  BP 124/72 (BP Location: Left Arm, Patient Position: Sitting, Cuff Size: Normal)   Pulse 78   Ht 4' 11.75" (1.518 m)   Wt 123 lb 12.8 oz (56.2 kg)   SpO2 99%   BMI 24.38 kg/m    Diabetic Foot Exam - Simple   Simple Foot Form Diabetic Foot exam was performed with the following findings: Yes   Visual Inspection No deformities, no ulcerations, no other skin breakdown bilaterally: Yes Sensation Testing Intact to touch and monofilament testing bilaterally: Yes Pulse Check Posterior Tibialis and Dorsalis pulse intact bilaterally: Yes Comments      ASSESSMENT:  Diabetes type 2, non-insulin requiring  See history of present illness for discussion of current diabetes management, blood sugar patterns and problems identified  A1c is 7  She is on Jardiance 25 mg, metformin 2 g daily, Trulicity A999333 mg weekly and Precose 25 mg at breakfast  Her home blood sugars are looking fairly good although not checked in the last 3 weeks or so A1c is slightly higher from inconsistent diet over the last couple of months Also can do better with some walking for exercise  Foot exam shows no vascular or neurological abnormality  HYPERTENSION: Blood pressure is fairly good with 10 mg lisinopril, also monitors at home  Lipids: To be  checked on the next visit  HYPOTHYROIDISM: Adequately replaced with 50 mcg levothyroxine  PLAN:    She will continue her Trulicity A999333 mg weekly as well as all her other medications Start walking for exercise as much as possible More blood sugar readings after breakfast which is her largest meal  Continue monitoring blood pressure periodically at home      Patient Instructions  Check blood sugars on waking up days a week  Also check blood sugars about 2 hours after meals and do this after different meals by rotation  Recommended blood sugar levels on waking up are 90-130 and about 2 hours after meal is 130-160  Please bring your blood sugar  monitor to each visit, thank you   Elayne Snare 07/29/2022, 11:13 AM    Note: This office note was prepared with Dragon voice recognition system technology. Any transcriptional errors that result from this process are unintentional.

## 2022-07-29 NOTE — Patient Instructions (Signed)
Check blood sugars on waking up days a week  Also check blood sugars about 2 hours after meals and do this after different meals by rotation  Recommended blood sugar levels on waking up are 90-130 and about 2 hours after meal is 130-160  Please bring your blood sugar monitor to each visit, thank you   

## 2022-08-23 ENCOUNTER — Other Ambulatory Visit: Payer: Self-pay | Admitting: Cardiology

## 2022-09-01 ENCOUNTER — Other Ambulatory Visit: Payer: Self-pay | Admitting: Endocrinology

## 2022-09-01 ENCOUNTER — Other Ambulatory Visit: Payer: Self-pay | Admitting: Cardiology

## 2022-10-01 ENCOUNTER — Other Ambulatory Visit: Payer: Self-pay | Admitting: Endocrinology

## 2022-10-01 ENCOUNTER — Other Ambulatory Visit: Payer: Self-pay | Admitting: Cardiology

## 2022-10-01 DIAGNOSIS — I10 Essential (primary) hypertension: Secondary | ICD-10-CM

## 2022-11-18 ENCOUNTER — Other Ambulatory Visit: Payer: Self-pay | Admitting: Endocrinology

## 2022-11-18 ENCOUNTER — Other Ambulatory Visit: Payer: Self-pay | Admitting: Cardiology

## 2022-11-18 DIAGNOSIS — I10 Essential (primary) hypertension: Secondary | ICD-10-CM

## 2022-11-24 ENCOUNTER — Other Ambulatory Visit: Payer: Medicaid Other

## 2022-11-25 ENCOUNTER — Other Ambulatory Visit (INDEPENDENT_AMBULATORY_CARE_PROVIDER_SITE_OTHER): Payer: Medicaid Other

## 2022-11-25 DIAGNOSIS — E1165 Type 2 diabetes mellitus with hyperglycemia: Secondary | ICD-10-CM | POA: Diagnosis not present

## 2022-11-25 DIAGNOSIS — E782 Mixed hyperlipidemia: Secondary | ICD-10-CM

## 2022-11-25 LAB — LIPID PANEL
Cholesterol: 84 mg/dL (ref 0–200)
HDL: 39 mg/dL — ABNORMAL LOW (ref >39.00)
LDL Cholesterol: 30 mg/dL (ref 0–99)
NonHDL: 45.12
Total CHOL/HDL Ratio: 2
Triglycerides: 74 mg/dL (ref 0.0–149.0)
VLDL: 14.8 mg/dL (ref 0.0–40.0)

## 2022-11-25 LAB — COMPREHENSIVE METABOLIC PANEL
ALT: 8 U/L (ref 0–35)
AST: 13 U/L (ref 0–37)
Albumin: 4.5 g/dL (ref 3.5–5.2)
Alkaline Phosphatase: 44 U/L (ref 39–117)
BUN: 12 mg/dL (ref 6–23)
CO2: 27 mEq/L (ref 19–32)
Calcium: 9.7 mg/dL (ref 8.4–10.5)
Chloride: 103 mEq/L (ref 96–112)
Creatinine, Ser: 0.72 mg/dL (ref 0.40–1.20)
GFR: 85.84 mL/min (ref >60.00)
Glucose, Bld: 120 mg/dL — ABNORMAL HIGH (ref 70–99)
Potassium: 4.5 mEq/L (ref 3.5–5.1)
Sodium: 139 mEq/L (ref 135–145)
Total Bilirubin: 0.5 mg/dL (ref 0.2–1.2)
Total Protein: 6.8 g/dL (ref 6.0–8.3)

## 2022-11-25 LAB — MICROALBUMIN / CREATININE URINE RATIO
Creatinine,U: 103 mg/dL
Microalb Creat Ratio: 1.6 mg/g (ref 0.0–30.0)
Microalb, Ur: 1.6 mg/dL (ref 0.0–1.9)

## 2022-11-25 LAB — HEMOGLOBIN A1C: Hgb A1c MFr Bld: 6.6 % — ABNORMAL HIGH (ref 4.6–6.5)

## 2022-11-27 ENCOUNTER — Ambulatory Visit: Payer: Medicaid Other | Admitting: Endocrinology

## 2022-11-27 VITALS — BP 118/62 | HR 71 | Ht <= 58 in | Wt 124.2 lb

## 2022-11-27 DIAGNOSIS — Z7984 Long term (current) use of oral hypoglycemic drugs: Secondary | ICD-10-CM | POA: Diagnosis not present

## 2022-11-27 DIAGNOSIS — E039 Hypothyroidism, unspecified: Secondary | ICD-10-CM

## 2022-11-27 DIAGNOSIS — E1140 Type 2 diabetes mellitus with diabetic neuropathy, unspecified: Secondary | ICD-10-CM | POA: Diagnosis not present

## 2022-11-27 DIAGNOSIS — I10 Essential (primary) hypertension: Secondary | ICD-10-CM

## 2022-11-27 NOTE — Progress Notes (Signed)
Patient ID: Verba Degen, female   DOB: August 30, 1955, 67 y.o.   MRN: [id number]           Reason for Appointment: Follow-up    History of Present Illness:          Diagnosis: Type 2 diabetes mellitus, date of diagnosis: ?  2014   Past history:   Her son thinks that she may have had diabetes diagnosed when she was in [place] When she moved to [facility] in 2014 she was not on any medications She was however found to have significant hyperglycemia with glucose 364 and A1c of 11.7 when she was admitted for her coronary artery disease in 04/2013. At that time she was started on premixed insulin twice a day Her level of control had not been adequate with persistently high A1c readings She did not tolerate Victoza well and was having decreased appetite, malaise and nausea and this was stopped subsequently  Recent history:        Oral hypoglycemic drugs:     Metformin ER 500 mg 2 tablets daily, Jardiance 25 mg daily,  Trulicity 0.75 mg weekly acarbose 25 mg daily  Her A1c is now 6.6  previously 6.7  Current blood sugar patterns and problems: Last visit was 4 months ago  She has done better on her diet in the last few months compared to before but because not seeing any change in her sugars She may be doing some walking at home but mostly active with her grandchild Weight is about the same No side effects with any of her medications No suppression of appetite with Trulicity currently Still takes acarbose consistently at breakfast which she thinks is her larger carbohydrate meal and not at other meals Not clear what her blood sugars are after lunch or dinner   Side effects from medications have been: Nausea from Victoza, decreased appetite from higher doses of Trulicity  Self-care: The diet that the patient has been following is: None, has vegetarian diet Usually having vegetables and roti with lunch and dinner and sometimes having lentils.   She does not like yogurt as it  apparently causes heartburn  Usually not eating fried food                  Dietician visit, none   Glucose monitoring:  done up to 2 times a day       Glucometer: Accu-Chek  Blood Glucose readings from review as above, recent blood sugar RANGE 110-129 fasting and 124 at night  compared to previous range 92-132 Blood sugars being checked mostly in the morning 30-day average = 110  Meter not able to be downloaded today  Weight history:   Wt Readings from Last 3 Encounters:  11/27/22 124 lb 3.2 oz (56.3 kg)  07/29/22 123 lb 12.8 oz (56.2 kg)  03/26/22 121 lb 12.8 oz (55.2 kg)    Glycemic control:   Lab Results  Component Value Date   HGBA1C 6.6 (H) 11/25/2022   HGBA1C 7.0 (H) 07/25/2022   HGBA1C 6.7 (H) 03/21/2022   Lab Results  Component Value Date   MICROALBUR 1.6 11/25/2022   LDLCALC 30 11/25/2022   CREATININE 0.72 11/25/2022    Lab on 11/25/2022  Component Date Value Ref Range Status   Microalb, Ur 11/25/2022 1.6  0.0 - 1.9 mg/dL Final   Creatinine,U 40/98/1191 103.0  mg/dL Final   Microalb Creat Ratio 11/25/2022 1.6  0.0 - 30.0 mg/g Final   Cholesterol 11/25/2022 84  0 -  200 mg/dL Final   ATP III Classification       Desirable:  < 200 mg/dL               Borderline High:  200 - 239 mg/dL          High:  > = 161 mg/dL   Triglycerides 09/60/4540 74.0  0.0 - 149.0 mg/dL Final   Normal:  <981 mg/dLBorderline High:  150 - 199 mg/dL   HDL 19/14/7829 56.21 (L)  >30.86 mg/dL Final   VLDL 57/84/6962 14.8  0.0 - 40.0 mg/dL Final   LDL Cholesterol 11/25/2022 30  0 - 99 mg/dL Final   Total CHOL/HDL Ratio 11/25/2022 2   Final                  Men          Women1/2 Average Risk     3.4          3.3Average Risk          5.0          4.42X Average Risk          9.6          7.13X Average Risk          15.0          11.0                       NonHDL 11/25/2022 45.12   Final   NOTE:  Non-HDL goal should be 30 mg/dL higher than patient's LDL goal (i.e. LDL goal of < 70 mg/dL,  would have non-HDL goal of < 100 mg/dL)   Sodium 95/28/4132 440  135 - 145 mEq/L Final   Potassium 11/25/2022 4.5  3.5 - 5.1 mEq/L Final   Chloride 11/25/2022 103  96 - 112 mEq/L Final   CO2 11/25/2022 27  19 - 32 mEq/L Final   Glucose, Bld 11/25/2022 120 (H)  70 - 99 mg/dL Final   BUN 04/12/2535 12  6 - 23 mg/dL Final   Creatinine, Ser 11/25/2022 0.72  0.40 - 1.20 mg/dL Final   Total Bilirubin 11/25/2022 0.5  0.2 - 1.2 mg/dL Final   Alkaline Phosphatase 11/25/2022 44  39 - 117 U/L Final   AST 11/25/2022 13  0 - 37 U/L Final   ALT 11/25/2022 8  0 - 35 U/L Final   Total Protein 11/25/2022 6.8  6.0 - 8.3 g/dL Final   Albumin 64/40/3474 4.5  3.5 - 5.2 g/dL Final   GFR 25/95/6387 85.84  >60.00 mL/min Final   Calculated using the CKD-EPI Creatinine Equation (2021)   Calcium 11/25/2022 9.7  8.4 - 10.5 mg/dL Final   Hgb F6E MFr Bld 11/25/2022 6.6 (H)  4.6 - 6.5 % Final   Glycemic Control Guidelines for People with Diabetes:Non Diabetic:  <6%Goal of Therapy: <7%Additional Action Suggested:  >8%       Allergies as of 11/27/2022   No Known Allergies      Medication List        Accurate as of November 27, 2022 11:21 AM. If you have any questions, ask your nurse or doctor.          acarbose 25 MG tablet Commonly known as: PRECOSE TAKE 1 TABLET BY MOUTH AT BREAKFAST, THEN TAKE 2 TABLETS BY MOUTH AT [facility] AND 1 TABLET BY MOUTH AT DINNER   Accu-Chek Guide test strip Generic drug: glucose blood USE TO  CHECK BLOOD SUGAR 3 TIMES A DAY   Accu-Chek Guide w/Device Kit Use to check blood sugar 3 times a day.   Accu-Chek Softclix Lancets lancets USE TO CHECK BLOOD SUGAR 3 TIMES A DAY   aspirin EC 81 MG tablet Take 1 tablet (81 mg total) by mouth daily.   atorvastatin 80 MG tablet Commonly known as: LIPITOR TAKE 1 TABLET (80 MG TOTAL) BY MOUTH DAILY AT 6 PM.   gabapentin 100 MG capsule Commonly known as: NEURONTIN TAKE 2 CAPSULES (200 MG TOTAL) BY MOUTH AT BEDTIME.   Jardiance 25 MG  Tabs tablet Generic drug: empagliflozin TAKE 1 TABLET (25 MG TOTAL) BY MOUTH DAILY BEFORE BREAKFAST.   levothyroxine 50 MCG tablet Commonly known as: SYNTHROID TAKE 1 TABLET (50 MCG TOTAL) BY MOUTH DAILY.   lisinopril 20 MG tablet Commonly known as: ZESTRIL TAKE 1 TABLET (20 MG TOTAL) BY MOUTH DAILY.   metFORMIN 500 MG 24 hr tablet Commonly known as: GLUCOPHAGE-XR TAKE 4 TABLETS BY MOUTH DAILY WITH SUPPER   metoprolol tartrate 25 MG tablet Commonly known as: LOPRESSOR TAKE 1/2 TABLET (12.5 MG TOTAL) BY MOUTH 2 (TWO) TIMES DAILY.   miconazole 2 % vaginal cream Commonly known as: Monistat 7 Simply Cure Place 1 Applicatorful vaginally at bedtime.   nitroGLYCERIN 0.4 MG SL tablet Commonly known as: NITROSTAT Place 1 tablet (0.4 mg total) under the tongue every 5 (five) minutes as needed for chest pain.   ondansetron 4 MG disintegrating tablet Commonly known as: Zofran ODT Take 1 tablet (4 mg total) by mouth every 8 (eight) hours as needed for nausea or vomiting.   pantoprazole 40 MG tablet Commonly known as: PROTONIX TAKE 1 TABLET (40 MG TOTAL) BY MOUTH DAILY.   Trulicity 0.75 MG/0.5ML Sopn Generic drug: Dulaglutide INJECT 0.75 MG IN THE ABDOMINAL SKIN AS DIRECTED ONCE A WEEK        Allergies: No Known Allergies  Past Medical History:  Diagnosis Date   Anemia    CAD (coronary artery disease)    a. 04/2013 Inf STEMI/PCI: LM nl, LAD min irregs, D1/2/3 nl, LCX 10m (2.5x18 Xience DES), OM1/2/3 nl, RCA nl, PDA nl, RPAV nl, RPL nl, EF 55%;  b. 04/2013 Echo: EF 55-60%, no rwma, mild MR, mildly dil LA.   Diabetes mellitus type 2, uncontrolled    a. 04/2013 HbA1c = 11.7.   GERD (gastroesophageal reflux disease)    Hyperlipidemia    Hypertension    Hypothyroidism     Past Surgical History:  Procedure Laterality Date   CESAREAN SECTION     LEFT HEART CATH  05-15-13   LEFT HEART CATHETERIZATION WITH CORONARY ANGIOGRAM N/A 05/15/2013   Procedure: LEFT HEART  CATHETERIZATION WITH CORONARY ANGIOGRAM;  Surgeon: Iran Ouch, MD;  Location: [facility] CATH LAB;  Service: Cardiovascular;  Laterality: N/A;   PERCUTANEOUS STENT INTERVENTION  05/15/2013   Procedure: PERCUTANEOUS STENT INTERVENTION;  Surgeon: Iran Ouch, MD;  Location: [facility] CATH LAB;  Service: Cardiovascular;;    Family History  Problem Relation Age of Onset   Heart disease Mother    Diabetes Mother    Hypertension Mother    Heart disease Father    Diabetes Father    Hypertension Father    Diabetes Brother     Social History:  reports that she has never smoked. She has never used smokeless tobacco. She reports that she does not drink alcohol and does not use drugs.    Review of Systems   HYPOTHYROIDISM: Screening TSH  had been done because of her complaints of carpal tunnel syndrome and some fatigue Because of her high TSH she has been on levothyroxine 25 mcg daily  In 4/22 was having symptoms of hair loss and also complaining of some fatigue With taking 50 mcg levothyroxine her symptoms improved Subsequent TSH level has been normal  No history of goiter  Last TSH:  Lab Results  Component Value Date   TSH 1.47 07/25/2022   TSH 2.24 11/25/2021   TSH 2.20 06/03/2021   FREET4 1.26 11/25/2021   FREET4 0.93 10/01/2020   FREET4 0.96 06/20/2019     NEUROPATHY: She has  symptoms of numbness and some tingling in her hands and usually when waking up Has no significant symptoms in her feet now Occasionally will have more symptoms of neuropathy during the night Taking gabapentin with relief  She has carpal tunnel syndrome but has not benefited from injection  Lipids: She is treated with Lipitor 80 mg for several years Lipids are well controlled, has history of CAD followed by cardiologist      Lab Results  Component Value Date   CHOL 84 11/25/2022   HDL 39.00 (L) 11/25/2022   LDLCALC 30 11/25/2022   TRIG 74.0 11/25/2022   CHOLHDL 2 11/25/2022                  HYPERTENSION   The blood pressure has been consistently controlled with lisinopril 20 mg    BP Readings from Last 3 Encounters:  11/27/22 118/62  07/29/22 124/72  03/26/22 138/84   She had a screening B12 level done because of neuropathic symptoms and long-term Metformin Had a low level at baseline and recommended B12 supplements   Lab Results  Component Value Date   VITAMINB12 >1500 (H) 09/26/2019    No history of anemia  Lab Results  Component Value Date   HGB 13.4 03/21/2020    She has not established with a PCP despite reminders  LABS:  Lab on 11/25/2022  Component Date Value Ref Range Status   Microalb, Ur 11/25/2022 1.6  0.0 - 1.9 mg/dL Final   Creatinine,U 16/03/9603 103.0  mg/dL Final   Microalb Creat Ratio 11/25/2022 1.6  0.0 - 30.0 mg/g Final   Cholesterol 11/25/2022 84  0 - 200 mg/dL Final   ATP III Classification       Desirable:  < 200 mg/dL               Borderline High:  200 - 239 mg/dL          High:  > = 540 mg/dL   Triglycerides 98/04/9146 74.0  0.0 - 149.0 mg/dL Final   Normal:  <829 mg/dLBorderline High:  150 - 199 mg/dL   HDL 56/21/3086 57.84 (L)  >69.62 mg/dL Final   VLDL 95/28/4132 14.8  0.0 - 40.0 mg/dL Final   LDL Cholesterol 11/25/2022 30  0 - 99 mg/dL Final   Total CHOL/HDL Ratio 11/25/2022 2   Final                  Men          Women1/2 Average Risk     3.4          3.3Average Risk          5.0          4.42X Average Risk          9.6          7.13X  Average Risk          15.0          11.0                       NonHDL 11/25/2022 45.12   Final   NOTE:  Non-HDL goal should be 30 mg/dL higher than patient's LDL goal (i.e. LDL goal of < 70 mg/dL, would have non-HDL goal of < 100 mg/dL)   Sodium 16/03/9603 540  135 - 145 mEq/L Final   Potassium 11/25/2022 4.5  3.5 - 5.1 mEq/L Final   Chloride 11/25/2022 103  96 - 112 mEq/L Final   CO2 11/25/2022 27  19 - 32 mEq/L Final   Glucose, Bld 11/25/2022 120 (H)  70 - 99 mg/dL Final   BUN 98/04/9146 12   6 - 23 mg/dL Final   Creatinine, Ser 11/25/2022 0.72  0.40 - 1.20 mg/dL Final   Total Bilirubin 11/25/2022 0.5  0.2 - 1.2 mg/dL Final   Alkaline Phosphatase 11/25/2022 44  39 - 117 U/L Final   AST 11/25/2022 13  0 - 37 U/L Final   ALT 11/25/2022 8  0 - 35 U/L Final   Total Protein 11/25/2022 6.8  6.0 - 8.3 g/dL Final   Albumin 82/95/6213 4.5  3.5 - 5.2 g/dL Final   GFR 08/65/7846 85.84  >60.00 mL/min Final   Calculated using the CKD-EPI Creatinine Equation (2021)   Calcium 11/25/2022 9.7  8.4 - 10.5 mg/dL Final   Hgb N6E MFr Bld 11/25/2022 6.6 (H)  4.6 - 6.5 % Final   Glycemic Control Guidelines for People with Diabetes:Non Diabetic:  <6%Goal of Therapy: <7%Additional Action Suggested:  >8%     Physical Examination:  BP 118/62 (BP Location: Left Arm, Patient Position: Sitting)   Pulse 71   Ht 4\' 8"  (1.422 m)   Wt 124 lb 3.2 oz (56.3 kg)   SpO2 98%   BMI 27.85 kg/m      ASSESSMENT:  Diabetes type 2, non-insulin requiring  See history of present illness for discussion of current diabetes management, blood sugar patterns and problems identified  A1c is 6.6  She is on Jardiance 25 mg, metformin 2 g daily, Trulicity 0.75 mg weekly and Precose 25 mg at breakfast  Her level of control is somewhat better likely from improved diet compared to last visit Tolerating her multidrug regimen as above However not clear if she has high postprandial readings    HYPERTENSION: Blood pressure is fairly good with 20 mg lisinopril, needs to monitor regularly at home  Lipids: Controlled high-dose atorvastatin  HYPOTHYROIDISM:- Asymptomatic, previously adequately replaced with 50 mcg levothyroxine  PLAN:    She will continue her Trulicity 0.75 mg weekly since it did not decrease in appetite No change in medications  Check blood sugars after one of her meals regularly and not as needed in the morning  Follow-up with hand surgeon when she is ready to have surgery  Check TSH on the  next visit  There are no Patient Instructions on file for this visit.  Reather Littler 11/27/2022, 11:21 AM    Note: This office note was prepared with Dragon voice recognition system technology. Any transcriptional errors that result from this process are unintentional.

## 2022-11-28 MED ORDER — METFORMIN HCL ER 500 MG PO TB24: ORAL_TABLET | ORAL | 1 refills | Status: DC

## 2022-11-28 MED ORDER — ACARBOSE 25 MG PO TABS: ORAL_TABLET | ORAL | 1 refills | Status: DC

## 2022-12-16 ENCOUNTER — Other Ambulatory Visit: Payer: Self-pay | Admitting: Endocrinology

## 2022-12-16 ENCOUNTER — Other Ambulatory Visit: Payer: Self-pay | Admitting: Cardiology

## 2022-12-16 DIAGNOSIS — I10 Essential (primary) hypertension: Secondary | ICD-10-CM

## 2022-12-25 ENCOUNTER — Telehealth: Payer: Self-pay | Admitting: Cardiology

## 2022-12-25 DIAGNOSIS — I10 Essential (primary) hypertension: Secondary | ICD-10-CM

## 2022-12-25 MED ORDER — LISINOPRIL 20 MG PO TABS: 20.0000 mg | ORAL_TABLET | Freq: Every day | ORAL | 4 refills | Status: DC

## 2022-12-25 MED ORDER — METOPROLOL TARTRATE 25 MG PO TABS
12.5000 mg | ORAL_TABLET | Freq: Two times a day (BID) | ORAL | 4 refills | Status: DC
Start: 2022-12-25 — End: 2023-05-19

## 2022-12-25 MED ORDER — ATORVASTATIN CALCIUM 80 MG PO TABS: 80.0000 mg | ORAL_TABLET | Freq: Every day | ORAL | 4 refills | Status: DC

## 2022-12-25 NOTE — Telephone Encounter (Signed)
Pt's medications were sent to pt's pharmacy as requested. Confirmation received.  

## 2022-12-25 NOTE — Telephone Encounter (Signed)
*  STAT* If patient is at the pharmacy, call can be transferred to refill team.   1. Which medications need to be refilled? (please list name of each medication and dose if known)   metoprolol tartrate (LOPRESSOR) 25 MG tablet    lisinopril (ZESTRIL) 20 MG tablet    atorvastatin (LIPITOR) 80 MG tablet   2. Which pharmacy/location (including street and city if local pharmacy) is medication to be sent to? [facility] - [place], [place] - [address]    3. Do they need a 30 day or 90 day supply? 30 day

## 2023-01-06 ENCOUNTER — Other Ambulatory Visit: Payer: Self-pay | Admitting: Cardiology

## 2023-02-10 ENCOUNTER — Other Ambulatory Visit: Payer: Self-pay | Admitting: Endocrinology

## 2023-02-10 ENCOUNTER — Other Ambulatory Visit: Payer: Self-pay | Admitting: Cardiology

## 2023-02-10 DIAGNOSIS — E1140 Type 2 diabetes mellitus with diabetic neuropathy, unspecified: Secondary | ICD-10-CM

## 2023-02-10 NOTE — Telephone Encounter (Signed)
Pt is requesting a refill. 

## 2023-04-01 ENCOUNTER — Other Ambulatory Visit (INDEPENDENT_AMBULATORY_CARE_PROVIDER_SITE_OTHER): Payer: Medicaid Other

## 2023-04-01 DIAGNOSIS — E1140 Type 2 diabetes mellitus with diabetic neuropathy, unspecified: Secondary | ICD-10-CM | POA: Diagnosis not present

## 2023-04-01 DIAGNOSIS — E039 Hypothyroidism, unspecified: Secondary | ICD-10-CM | POA: Diagnosis not present

## 2023-04-01 LAB — BASIC METABOLIC PANEL
BUN: 9 mg/dL (ref 6–23)
CO2: 28 meq/L (ref 19–32)
Calcium: 9.5 mg/dL (ref 8.4–10.5)
Chloride: 105 meq/L (ref 96–112)
Creatinine, Ser: 0.76 mg/dL (ref 0.40–1.20)
GFR: 80.25 mL/min (ref >60.00)
Glucose, Bld: 104 mg/dL — ABNORMAL HIGH (ref 70–99)
Potassium: 4.2 meq/L (ref 3.5–5.1)
Sodium: 139 meq/L (ref 135–145)

## 2023-04-01 LAB — HEMOGLOBIN A1C: Hgb A1c MFr Bld: 6.8 % — ABNORMAL HIGH (ref 4.6–6.5)

## 2023-04-01 LAB — TSH: TSH: 1.77 u[IU]/mL (ref 0.35–5.50)

## 2023-04-06 ENCOUNTER — Emergency Department (HOSPITAL_COMMUNITY): Payer: Medicaid Other

## 2023-04-06 ENCOUNTER — Encounter (HOSPITAL_COMMUNITY): Payer: Self-pay

## 2023-04-06 ENCOUNTER — Emergency Department (HOSPITAL_COMMUNITY)
Admission: EM | Admit: 2023-04-06 | Discharge: 2023-04-07 | Discharge status: Against Medical Advice | Payer: Medicaid Other | Attending: Emergency Medicine | Admitting: Emergency Medicine

## 2023-04-06 DIAGNOSIS — I10 Essential (primary) hypertension: Secondary | ICD-10-CM | POA: Diagnosis not present

## 2023-04-06 DIAGNOSIS — Z955 Presence of coronary angioplasty implant and graft: Secondary | ICD-10-CM | POA: Diagnosis not present

## 2023-04-06 DIAGNOSIS — M549 Dorsalgia, unspecified: Secondary | ICD-10-CM | POA: Insufficient documentation

## 2023-04-06 DIAGNOSIS — Z5321 Procedure and treatment not carried out due to patient leaving prior to being seen by health care provider: Secondary | ICD-10-CM | POA: Diagnosis not present

## 2023-04-06 DIAGNOSIS — R079 Chest pain, unspecified: Secondary | ICD-10-CM | POA: Insufficient documentation

## 2023-04-06 DIAGNOSIS — I700 Atherosclerosis of aorta: Secondary | ICD-10-CM | POA: Diagnosis not present

## 2023-04-06 HISTORY — DX: Acute myocardial infarction, unspecified: I21.9

## 2023-04-06 LAB — CBC
HCT: 25.9 % — ABNORMAL LOW (ref 36.0–46.0)
Hemoglobin: 7.4 g/dL — ABNORMAL LOW (ref 12.0–15.0)
MCH: 18.5 pg — ABNORMAL LOW (ref 26.0–34.0)
MCHC: 28.6 g/dL — ABNORMAL LOW (ref 30.0–36.0)
MCV: 64.9 fL — ABNORMAL LOW (ref 80.0–100.0)
Platelets: 308 10*3/uL (ref 150–400)
RBC: 3.99 MIL/uL (ref 3.87–5.11)
RDW: 20.5 % — ABNORMAL HIGH (ref 11.5–15.5)
WBC: 5.4 10*3/uL (ref 4.0–10.5)
nRBC: 0 % (ref 0.0–0.2)

## 2023-04-06 LAB — BASIC METABOLIC PANEL
Anion gap: 9 (ref 5–15)
BUN: 10 mg/dL (ref 8–23)
CO2: 23 mmol/L (ref 22–32)
Calcium: 9.3 mg/dL (ref 8.9–10.3)
Chloride: 105 mmol/L (ref 98–111)
Creatinine, Ser: 0.75 mg/dL (ref 0.44–1.00)
GFR, Estimated: >60 mL/min (ref >60)
Glucose, Bld: 98 mg/dL (ref 70–99)
Potassium: 4.6 mmol/L (ref 3.5–5.1)
Sodium: 137 mmol/L (ref 135–145)

## 2023-04-06 LAB — TROPONIN I (HIGH SENSITIVITY): Troponin I (High Sensitivity): 4 ng/L (ref <18)

## 2023-04-06 NOTE — ED Triage Notes (Signed)
Pt arrived POV with son for back pain for several days, this evening pt experience left sided CP that radiates into her left shoulder. No pain at current, pt took ASA 81 mg PTA, MI 2014 with one stent placement. Has not seen cardiologist in over a year. HTN noted, otherwise VSS, A&O x4.   Nitroglycerin SL expired, pt did not take PTA.

## 2023-04-06 NOTE — ED Notes (Signed)
Family member stated they were leaving

## 2023-04-07 ENCOUNTER — Telehealth: Payer: Self-pay | Admitting: Cardiology

## 2023-04-07 ENCOUNTER — Other Ambulatory Visit: Payer: Self-pay | Admitting: Cardiology

## 2023-04-07 LAB — TROPONIN I (HIGH SENSITIVITY): Troponin I (High Sensitivity): 5 ng/L (ref <18)

## 2023-04-07 MED ORDER — NITROGLYCERIN 0.4 MG SL SUBL: 0.4000 mg | SUBLINGUAL_TABLET | SUBLINGUAL | 0 refills | Status: AC | PRN

## 2023-04-07 NOTE — Telephone Encounter (Signed)
*  STAT* If patient is at the pharmacy, call can be transferred to refill team.   1. Which medications need to be refilled? (please list name of each medication and dose if known)  nitroGLYCERIN (NITROSTAT) 0.4 MG SL tablet    2. Would you like to learn more about the convenience, safety, & potential cost savings by using the [facility]?      3. Are you open to using the [facility] (Type [facility].  ).   4. Which pharmacy/location (including street and city if local pharmacy) is medication to be sent to? [facility] - [place], [place] - [address]    5. Do they need a 30 day or 90 day supply? 30 day

## 2023-04-07 NOTE — Telephone Encounter (Signed)
Pt's medication was sent to pt's pharmacy as requested. Confirmation received.  °

## 2023-04-09 ENCOUNTER — Encounter: Payer: Self-pay | Admitting: Endocrinology

## 2023-04-09 ENCOUNTER — Encounter: Payer: Self-pay | Admitting: Cardiology

## 2023-04-09 ENCOUNTER — Ambulatory Visit: Payer: Medicaid Other | Admitting: Endocrinology

## 2023-04-09 ENCOUNTER — Ambulatory Visit: Payer: Medicaid Other | Attending: Cardiology | Admitting: Cardiology

## 2023-04-09 VITALS — BP 118/62 | HR 77 | Resp 20 | Ht <= 58 in | Wt 125.2 lb

## 2023-04-09 VITALS — BP 110/60 | HR 73 | Ht <= 58 in | Wt 123.2 lb

## 2023-04-09 DIAGNOSIS — D508 Other iron deficiency anemias: Secondary | ICD-10-CM

## 2023-04-09 DIAGNOSIS — E1140 Type 2 diabetes mellitus with diabetic neuropathy, unspecified: Secondary | ICD-10-CM | POA: Diagnosis not present

## 2023-04-09 DIAGNOSIS — E039 Hypothyroidism, unspecified: Secondary | ICD-10-CM | POA: Diagnosis not present

## 2023-04-09 DIAGNOSIS — Z7985 Long-term (current) use of injectable non-insulin antidiabetic drugs: Secondary | ICD-10-CM

## 2023-04-09 DIAGNOSIS — D509 Iron deficiency anemia, unspecified: Secondary | ICD-10-CM

## 2023-04-09 DIAGNOSIS — I2510 Atherosclerotic heart disease of native coronary artery without angina pectoris: Secondary | ICD-10-CM

## 2023-04-09 DIAGNOSIS — E063 Autoimmune thyroiditis: Secondary | ICD-10-CM

## 2023-04-09 DIAGNOSIS — Z7984 Long term (current) use of oral hypoglycemic drugs: Secondary | ICD-10-CM

## 2023-04-09 DIAGNOSIS — I252 Old myocardial infarction: Secondary | ICD-10-CM | POA: Diagnosis not present

## 2023-04-09 NOTE — Progress Notes (Signed)
Cardiology Office Note:  .   Date:  04/09/2023  ID:  [name] [name], DOB [birth year], MRN [id number] PCP: Patient, No Pcp Per  [facility] Providers Cardiologist:  Donato Schultz, MD     History of Present Illness: .   [name] [name] is a 67 y.o. female Discussed with the use of AI scribe   History of Present Illness   A 67 year old patient with a history of coronary artery disease, diabetes, hypertension, hypothyroidism, and hyperlipidemia, presented with a significant drop in hemoglobin from 13.4 to 7.4 within a span of 3 years. The patient had a previous episode of iron deficiency anemia in 2019, with a hemoglobin level as low as 8.8. The patient underwent upper and lower endoscopy in early 2020, which revealed a polyp but no source of bleeding. Her son is here to help with translation.   The patient's son reported a recent episode of chest pain that resolved after the patient burped, suggesting possible gas-related discomfort. Troponin in ER was normal. ECG non ischemic.  However, the patient has been experiencing persistent back and shoulder pain, along with a sensation of 'poking' in the hand, which could be related to her known arthritis.  The patient has been craving and consuming ice for several months, a symptom often associated with iron deficiency anemia. The patient's appetite has reportedly decreased, and she has been experiencing fatigue, particularly when climbing stairs or exerting herself.  The patient has a planned trip to [place] in mid-December and is currently not on any iron supplements. The patient denies any recent use of NSAIDs like Advil and reports no changes in bowel movements or presence of melena.          Studies Reviewed: .        Results LABS Hb: 7.4 (04/06/2023) Hb: 13.4 (03/21/2020) Hb: 8.8 (2019) MCV: 64.9 (04/06/2023) MCV: 71 (2019) Ferritin: 4.7 (07/2018)  DIAGNOSTIC EKG: sinus rhythm, low voltage, no other abnormalities  (04/06/2023) Upper endoscopy: normal (Early 2020) Lower endoscopy: polyp, no source of bleeding (Early 2020)  Risk Assessment/Calculations:            Physical Exam:   VS:  BP 110/60   Pulse 73   Ht 4\' 8"  (1.422 m)   Wt 123 lb 3.2 oz (55.9 kg)   SpO2 99%   BMI 27.62 kg/m    Wt Readings from Last 3 Encounters:  04/09/23 123 lb 3.2 oz (55.9 kg)  04/09/23 125 lb 3.2 oz (56.8 kg)  11/27/22 124 lb 3.2 oz (56.3 kg)    GEN: Pale conjunctiva. Ashen skin appearance. Well nourished, well developed in no acute distress NECK: No JVD; No carotid bruits CARDIAC: RRR, no murmurs, no rubs, no gallops RESPIRATORY:  Clear to auscultation without rales, wheezing or rhonchi  ABDOMEN: Soft, non-tender, non-distended EXTREMITIES:  No edema; No deformity   ASSESSMENT AND PLAN: .    Assessment and Plan    Iron Deficiency Anemia Significant drop in hemoglobin from 13.4 to 7.4 within past 3 years. History of iron deficiency anemia diagnosed in 2019. No current signs of active GI bleeding. Craving ice, a symptom of pica associated with iron deficiency anemia. -Refer to Dr. [facility] whom she has seen before for potential repeat upper and lower endoscopy or capsule. -Stop Aspirin due to low hemoglobin. -If symptoms worsen or become more worrisome, report back to ER.   Coronary Artery Disease Inferior STEMI in 2014 with drug-eluting stent to mid circumflex artery. Recent episode of chest pain relieved by  burping, suggesting possible GI origin. -Continue current cardiac medications. -Monitor for recurrent chest pain.  Hypertension, Diabetes, Hypothyroidism, Hyperlipidemia Managed by Endocrinology. -Continue current medications as prescribed by Endocrinology.  Arthritis Complaints of back and shoulder pain. History of refusing surgery. -Continue current pain management regimen. -Avoid NSAIDs due to anemia.  General Health Maintenance -Plan for patient's upcoming trip to [place] in  December. Ensure adequate medication supply for the duration of the trip. -Follow-up after patient's return from [place].               Signed, Donato Schultz, MD

## 2023-04-09 NOTE — Progress Notes (Signed)
Outpatient Endocrinology Note [name] Winda Summerall, MD  04/10/23  Patient's Name: [name] [name]    DOB: 03-17-1956    MRN: [id number]                                                    REASON OF VISIT: Follow up of type 2 diabetes mellitus  PCP: Patient, No Pcp Per  HISTORY OF PRESENT ILLNESS:   [name] [name] is a 67 y.o. old female with past medical history listed below, is here for follow up for type 2 diabetes mellitus /hypothyroidism.  Patient was last seen by Dr. Lucianne Muss in June 2024.  Pertinent Diabetes History: Patient was diagnosed with type 2 diabetes mellitus around 2014.  It was diagnosed when she was in [place].  She moved to [place] in 2014.  Hemoglobin A1c was 11.7% at the time of diagnosis.  Chronic Diabetes Complications : Retinopathy: no. Last ophthalmology exam was done on annually.  Nephropathy: no, on ACE/ARB  Peripheral neuropathy: yes, numbness and tingling, on gabapentin.  She has carpal tunnel syndrome but benefited from injection. Coronary artery disease: yes, status post stent in 2014. Stroke: no  She had a screening B12 level done because of neuropathic symptoms and long-term Metformin Had a low level at baseline and recommended B12 supplements.  Relevant comorbidities and cardiovascular risk factors: Obesity: no Body mass index is 28.07 kg/m.  Hypertension: Yes  Hyperlipidemia : Yes, on statin   Current / Home Diabetic regimen includes: Metformin ER 500 mg 2 tablets daily.  Jardiance 25 mg daily Trulicity 0.75 mg weekly  Acarbose 25 mg daily with breakfast.  Prior diabetic medications: Victoza had decreased appetite, malaise, nausea. Decreased appetite on higher dose of Trulicity.  Glycemic data:   She has Accu-Chek glucometer however not able to download in the clinic today.  Not able to review glucose numbers from the meter directly as well.  She reports this morning blood sugar was 89.  Hypoglycemia: Patient has no hypoglycemic episodes.  Patient has hypoglycemia awareness.  Factors modifying glucose control: 1.  Diabetic diet assessment: 3 meals a day.  Vegetarian diet.  2.  Staying active or exercising:   3.  Medication compliance: compliant all of the time.  # Primary hypothyroidism -Was diagnosed in 2018/2019 timeframe.  Has been on thyroid hormone replacement since the diagnosis.  Dose has been adjusted in the past.  Currently taking levothyroxine 5 mcg daily.  Interval history  Recent hemoglobin A1c 6.8%.  Not able to review glucometer data.  However reports blood sugar has been acceptable at home.  Reviewed diabetes regimen as above.  Tolerating Trulicity well.  No other complaints today.  She has no hypo and hyperthyroid symptoms.  Recent TSH normal 1.77.  Patient is accompanied by son in the clinic today.  Discussed about finding primary care provider.  Patient had chest pain few days ago and planning to see cardiology today.  REVIEW OF SYSTEMS As per history of present illness.   PAST MEDICAL HISTORY: Past Medical History:  Diagnosis Date   Anemia    CAD (coronary artery disease)    a. 04/2013 Inf STEMI/PCI: LM nl, LAD min irregs, D1/2/3 nl, LCX 68m (2.5x18 Xience DES), OM1/2/3 nl, RCA nl, PDA nl, RPAV nl, RPL nl, EF 55%;  b. 04/2013 Echo: EF 55-60%, no rwma, mild MR, mildly dil  LA.   Diabetes mellitus type 2, uncontrolled    a. 04/2013 HbA1c = 11.7.   GERD (gastroesophageal reflux disease)    Hyperlipidemia    Hypertension    Hypothyroidism    MI (myocardial infarction) (HCC)    2014 w/stent placement    PAST SURGICAL HISTORY: Past Surgical History:  Procedure Laterality Date   CESAREAN SECTION     LEFT HEART CATH  05-15-13   LEFT HEART CATHETERIZATION WITH CORONARY ANGIOGRAM N/A 05/15/2013   Procedure: LEFT HEART CATHETERIZATION WITH CORONARY ANGIOGRAM;  Surgeon: Iran Ouch, MD;  Location: [facility];  Service: Cardiovascular;  Laterality: N/A;   PERCUTANEOUS STENT INTERVENTION   05/15/2013   Procedure: PERCUTANEOUS STENT INTERVENTION;  Surgeon: Iran Ouch, MD;  Location: [facility] CATH [facility];  Service: Cardiovascular;;    ALLERGIES: No Known Allergies  FAMILY HISTORY:  Family History  Problem Relation Age of Onset   Heart disease Mother    Diabetes Mother    Hypertension Mother    Heart disease Father    Diabetes Father    Hypertension Father    Diabetes Brother     SOCIAL HISTORY: Social History   Socioeconomic History   Marital status: Widowed    Spouse name: Not on file   Number of children: Not on file   Years of education: Not on file   Highest education level: Not on file  Occupational History   Not on file  Tobacco Use   Smoking status: Never   Smokeless tobacco: Never  Vaping Use   Vaping status: Never Used  Substance and Sexual Activity   Alcohol use: No   Drug use: No   Sexual activity: Not Currently  Other Topics Concern   Not on file  Social History Narrative   Not on file   Social Determinants of Health   Financial Resource Strain: Not on file  Food Insecurity: Not on file  Transportation Needs: Not on file  Physical Activity: Not on file  Stress: Not on file  Social Connections: Not on file    MEDICATIONS:  Current Outpatient Medications  Medication Sig Dispense Refill   acarbose (PRECOSE) 25 MG tablet Take 1 tablet at the start of breakfast 90 tablet 1   ACCU-CHEK GUIDE test strip USE TO CHECK BLOOD SUGAR 3 TIMES A DAY 300 each 2   Accu-Chek Softclix Lancets lancets USE TO CHECK BLOOD SUGAR 3 TIMES A DAY 300 each 1   atorvastatin (LIPITOR) 80 MG tablet Take 1 tablet (80 mg total) by mouth daily at 6 PM. 30 tablet 4   Blood Glucose Monitoring Suppl (ACCU-CHEK GUIDE) w/Device KIT Use to check blood sugar 3 times a day. 1 kit 0   gabapentin (NEURONTIN) 100 MG capsule TAKE 2 CAPSULES (200 MG TOTAL) BY MOUTH AT BEDTIME. 60 capsule 0   JARDIANCE 25 MG TABS tablet TAKE 1 TABLET (25 MG TOTAL) BY MOUTH DAILY BEFORE BREAKFAST.  30 tablet 2   levothyroxine (SYNTHROID) 50 MCG tablet TAKE 1 TABLET (50 MCG TOTAL) BY MOUTH DAILY. 90 tablet 3   lisinopril (ZESTRIL) 20 MG tablet Take 1 tablet (20 mg total) by mouth daily. 30 tablet 4   metFORMIN (GLUCOPHAGE-XR) 500 MG 24 hr tablet 1 tablet twice a day 180 tablet 1   metoprolol tartrate (LOPRESSOR) 25 MG tablet Take 0.5 tablets (12.5 mg total) by mouth 2 (two) times daily. 30 tablet 4   miconazole (MONISTAT 7 SIMPLY CURE) 2 % vaginal cream Place 1 Applicatorful vaginally at  bedtime. 45 g 0   nitroGLYCERIN (NITROSTAT) 0.4 MG SL tablet Place 1 tablet (0.4 mg total) under the tongue every 5 (five) minutes as needed for chest pain. 25 tablet 0   ondansetron (ZOFRAN ODT) 4 MG disintegrating tablet Take 1 tablet (4 mg total) by mouth every 8 (eight) hours as needed for nausea or vomiting. 20 tablet 0   pantoprazole (PROTONIX) 40 MG tablet TAKE 1 TABLET (40 MG TOTAL) BY MOUTH DAILY. 90 tablet 0   TRULICITY 0.75 MG/0.5ML SOPN INJECT 0.75 MG IN THE ABDOMINAL SKIN AS DIRECTED ONCE A WEEK 2 mL 5   No current facility-administered medications for this visit.    PHYSICAL EXAM: Vitals:   04/09/23 1401  BP: 118/62  Pulse: 77  Resp: 20  SpO2: 99%  Weight: 125 lb 3.2 oz (56.8 kg)  Height: 4\' 8"  (1.422 m)   Body mass index is 28.07 kg/m.  Wt Readings from Last 3 Encounters:  04/09/23 123 lb 3.2 oz (55.9 kg)  04/09/23 125 lb 3.2 oz (56.8 kg)  11/27/22 124 lb 3.2 oz (56.3 kg)    General: Well developed, well nourished female in no apparent distress.  HEENT: AT/[place], no external lesions.  Eyes: Conjunctiva clear and no icterus. Neck: Neck supple  Lungs: Respirations not labored Neurologic: Alert, oriented, normal speech Extremities / Skin: Dry. No sores or rashes noted.  Psychiatric: Does not appear depressed or anxious  Diabetic Foot Exam - Simple   No data filed    LABS Reviewed Lab Results  Component Value Date   HGBA1C 6.8 (H) 04/01/2023   HGBA1C 6.6 (H) 11/25/2022    HGBA1C 7.0 (H) 07/25/2022   Lab Results  Component Value Date   FRUCTOSAMINE 215 08/06/2021   FRUCTOSAMINE 224 01/30/2017   Lab Results  Component Value Date   CHOL 84 11/25/2022   HDL 39.00 (L) 11/25/2022   LDLCALC 30 11/25/2022   TRIG 74.0 11/25/2022   CHOLHDL 2 11/25/2022   Lab Results  Component Value Date   MICRALBCREAT 1.6 11/25/2022   MICRALBCREAT 1.1 11/25/2021   Lab Results  Component Value Date   CREATININE 0.75 04/06/2023   Lab Results  Component Value Date   GFR 80.25 04/01/2023    ASSESSMENT / PLAN  1. Controlled type 2 diabetes with neuropathy (HCC)   2. Adult hypothyroidism   3. Acquired autoimmune hypothyroidism     Diabetes Mellitus type 2, complicated by diabetic neuropathy/CAD. - Diabetic status / severity: Controlled.  Lab Results  Component Value Date   HGBA1C 6.8 (H) 04/01/2023    - Hemoglobin A1c goal : <7%  Had significant suppression of appetite on higher dose of Trulicity.  - Medications: No change. Metformin ER 500 mg 2 tablets daily.  Jardiance 25 mg daily Trulicity 0.75 mg weekly  Acarbose 25 mg daily with breakfast.  - Home glucose testing: In the morning fasting and bedtime. - Discussed/ Gave Hypoglycemia treatment plan.  # Consult : not required at this time.   # Annual urine for microalbuminuria/ creatinine ratio, no microalbuminuria currently, continue ACE/ARB /lisinopril. Last  Lab Results  Component Value Date   MICRALBCREAT 1.6 11/25/2022    # Foot check nightly / neuropathy, continue gabapentin 200 mg at bedtime.  # Annual dilated diabetic eye exams.   - Diet: Make healthy diabetic food choices - Life style / activity / exercise: Discussed.  2. Blood pressure  -  BP Readings from Last 1 Encounters:  04/09/23 110/60    - Control is  in target.  - No change in current plans.  3. Lipid status / Hyperlipidemia - Last  Lab Results  Component Value Date   LDLCALC 30 11/25/2022   - Continue  atorvastatin 80 mg daily.  # Primary hypothyroidism -Recent TSH normal.  Continue current dose of levothyroxine 50 mcg daily.  Diagnoses and all orders for this visit:  Controlled type 2 diabetes with neuropathy (HCC) -     Basic metabolic panel; Future -     Hemoglobin A1c; Future  Adult hypothyroidism -     T4, free; Future -     TSH; Future  Acquired autoimmune hypothyroidism   She is going to [place] during the month of January and February.  Will follow-up in March.  DISPOSITION Follow up in clinic in 5 months suggested.   All questions answered and patient verbalized understanding of the plan.  [name] [name] Macomber, MD [facility] [address] [place], [address] [place], [place] [address] Phone # [phone]  At least part of this note was generated using voice recognition software. Inadvertent word errors may have occurred, which were not recognized during the proofreading process.

## 2023-04-09 NOTE — Patient Instructions (Signed)
Medication Instructions:  Please discontinue your Aspirin. Continue all other medications as listed.  *If you need a refill on your cardiac medications before your next appointment, please call your pharmacy*   You have been referred to [facility].   Follow-Up: At [facility], you and your health needs are our priority.  As part of our continuing mission to provide you with exceptional heart care, we have created designated Provider Care Teams.  These Care Teams include your primary Cardiologist (physician) and Advanced Practice Providers (APPs -  Physician Assistants and Nurse Practitioners) who all work together to provide you with the care you need, when you need it.  We recommend signing up for the patient portal called "MyChart".  Sign up information is provided on this After Visit Summary.  MyChart is used to connect with patients for Virtual Visits (Telemedicine).  Patients are able to view lab/test results, encounter notes, upcoming appointments, etc.  Non-urgent messages can be sent to your provider as well.   To learn more about what you can do with MyChart, go to [email or web address].    Your next appointment:   1 year(s)  Provider:   Donato Schultz, MD

## 2023-04-10 ENCOUNTER — Encounter: Payer: Self-pay | Admitting: Endocrinology

## 2023-04-21 ENCOUNTER — Other Ambulatory Visit: Payer: Self-pay | Admitting: Cardiology

## 2023-04-24 ENCOUNTER — Other Ambulatory Visit: Payer: Self-pay

## 2023-04-24 DIAGNOSIS — E1140 Type 2 diabetes mellitus with diabetic neuropathy, unspecified: Secondary | ICD-10-CM

## 2023-04-24 MED ORDER — GABAPENTIN 100 MG PO CAPS: 200.0000 mg | ORAL_CAPSULE | Freq: Every day | ORAL | 0 refills | Status: DC

## 2023-05-01 ENCOUNTER — Ambulatory Visit: Payer: Medicaid Other | Admitting: Cardiology

## 2023-05-13 ENCOUNTER — Other Ambulatory Visit: Payer: Self-pay | Admitting: Cardiology

## 2023-05-13 DIAGNOSIS — I10 Essential (primary) hypertension: Secondary | ICD-10-CM

## 2023-05-18 ENCOUNTER — Other Ambulatory Visit: Payer: Self-pay | Admitting: Endocrinology

## 2023-05-18 DIAGNOSIS — E1140 Type 2 diabetes mellitus with diabetic neuropathy, unspecified: Secondary | ICD-10-CM

## 2023-05-22 ENCOUNTER — Other Ambulatory Visit: Payer: Self-pay

## 2023-05-22 DIAGNOSIS — E1140 Type 2 diabetes mellitus with diabetic neuropathy, unspecified: Secondary | ICD-10-CM

## 2023-05-22 DIAGNOSIS — E063 Autoimmune thyroiditis: Secondary | ICD-10-CM

## 2023-05-22 MED ORDER — METFORMIN HCL ER 500 MG PO TB24: ORAL_TABLET | ORAL | 1 refills | Status: AC

## 2023-05-22 MED ORDER — LEVOTHYROXINE SODIUM 50 MCG PO TABS: 50.0000 ug | ORAL_TABLET | Freq: Every day | ORAL | 3 refills | Status: AC

## 2023-05-22 MED ORDER — TRULICITY 0.75 MG/0.5ML ~~LOC~~ SOAJ: SUBCUTANEOUS | 5 refills | Status: DC

## 2023-05-22 MED ORDER — ACARBOSE 25 MG PO TABS: ORAL_TABLET | ORAL | 1 refills | Status: AC

## 2023-05-22 MED ORDER — EMPAGLIFLOZIN 25 MG PO TABS: ORAL_TABLET | ORAL | 2 refills | Status: AC

## 2023-06-11 ENCOUNTER — Other Ambulatory Visit: Payer: Self-pay

## 2023-06-11 DIAGNOSIS — E1140 Type 2 diabetes mellitus with diabetic neuropathy, unspecified: Secondary | ICD-10-CM

## 2023-06-11 MED ORDER — GABAPENTIN 100 MG PO CAPS
200.0000 mg | ORAL_CAPSULE | Freq: Every day | ORAL | 0 refills | Status: DC
Start: 2023-06-11 — End: 2023-09-23

## 2023-08-20 ENCOUNTER — Other Ambulatory Visit: Payer: Self-pay

## 2023-08-20 DIAGNOSIS — E1140 Type 2 diabetes mellitus with diabetic neuropathy, unspecified: Secondary | ICD-10-CM

## 2023-08-20 DIAGNOSIS — E039 Hypothyroidism, unspecified: Secondary | ICD-10-CM

## 2023-08-31 ENCOUNTER — Other Ambulatory Visit: Payer: Medicaid Other

## 2023-08-31 DIAGNOSIS — E1140 Type 2 diabetes mellitus with diabetic neuropathy, unspecified: Secondary | ICD-10-CM | POA: Diagnosis not present

## 2023-08-31 DIAGNOSIS — E039 Hypothyroidism, unspecified: Secondary | ICD-10-CM | POA: Diagnosis not present

## 2023-09-01 ENCOUNTER — Encounter: Payer: Self-pay | Admitting: Endocrinology

## 2023-09-01 LAB — BASIC METABOLIC PANEL
BUN: 7 mg/dL (ref 7–25)
CO2: 25 mmol/L (ref 20–32)
Calcium: 9.5 mg/dL (ref 8.6–10.4)
Chloride: 105 mmol/L (ref 98–110)
Creat: 0.75 mg/dL (ref 0.50–1.05)
Glucose, Bld: 108 mg/dL — ABNORMAL HIGH (ref 65–99)
Potassium: 4.7 mmol/L (ref 3.5–5.3)
Sodium: 138 mmol/L (ref 135–146)

## 2023-09-01 LAB — T4, FREE: Free T4: 1.3 ng/dL (ref 0.8–1.8)

## 2023-09-01 LAB — HEMOGLOBIN A1C
Hgb A1c MFr Bld: 6.7 %{Hb} — ABNORMAL HIGH (ref <5.7)
Mean Plasma Glucose: 146 mg/dL
eAG (mmol/L): 8.1 mmol/L

## 2023-09-01 LAB — TSH: TSH: 3.2 m[IU]/L (ref 0.40–4.50)

## 2023-09-07 ENCOUNTER — Encounter: Payer: Self-pay | Admitting: Endocrinology

## 2023-09-07 ENCOUNTER — Ambulatory Visit: Payer: Medicaid Other | Admitting: Endocrinology

## 2023-09-07 VITALS — BP 132/62 | HR 78 | Resp 20 | Ht <= 58 in | Wt 127.8 lb

## 2023-09-07 DIAGNOSIS — E1140 Type 2 diabetes mellitus with diabetic neuropathy, unspecified: Secondary | ICD-10-CM | POA: Diagnosis not present

## 2023-09-07 DIAGNOSIS — Z7984 Long term (current) use of oral hypoglycemic drugs: Secondary | ICD-10-CM | POA: Diagnosis not present

## 2023-09-07 DIAGNOSIS — R052 Subacute cough: Secondary | ICD-10-CM

## 2023-09-07 DIAGNOSIS — Z7985 Long-term (current) use of injectable non-insulin antidiabetic drugs: Secondary | ICD-10-CM

## 2023-09-07 DIAGNOSIS — E063 Autoimmune thyroiditis: Secondary | ICD-10-CM

## 2023-09-07 NOTE — Progress Notes (Signed)
 Outpatient Endocrinology Note [name] Jeryn Cerney, MD  09/07/23  Patient's Name: [name] [name]    DOB: 09/25/1955    MRN: [id number]                                                    REASON OF VISIT: Follow up of type 2 diabetes mellitus  PCP: Patient, No Pcp Per  HISTORY OF PRESENT ILLNESS:   [name] [name] [name] a 68 y.o. old female with past medical history listed [name], [name] here for follow up for type 2 diabetes mellitus /hypothyroidism.   Pertinent Diabetes History: Patient was previously seen by Dr. Lucianne [name] and was last time in June 2024.  Patient was diagnosed with type 2 diabetes mellitus around 2014.  It was diagnosed when she was in [place].  She moved to [place] in 2014.  Hemoglobin A1c was 11.7% at the time of diagnosis.  Chronic Diabetes Complications : Retinopathy: no. Last ophthalmology exam was done on annually.  Nephropathy: no, on ACE/ARB  Peripheral neuropathy: yes, numbness and tingling, on gabapentin.  She has carpal tunnel syndrome but benefited from injection. Coronary artery disease: yes, status post stent in 2014. Stroke: no  She had a screening B12 level done because of neuropathic symptoms and long-term Metformin Had a low level at baseline and recommended B12 supplements.  Relevant comorbidities and cardiovascular risk factors: Obesity: no Body mass index [name] 28.65 kg/m.  Hypertension: Yes  Hyperlipidemia : Yes, on statin   Current / Home Diabetic regimen includes: Metformin ER 500 mg 2 tablets daily.  Jardiance 25 mg daily Trulicity 0.75 mg weekly  Acarbose 25 mg daily with breakfast.  Prior diabetic medications: Victoza had decreased appetite, malaise, nausea. Decreased appetite on higher dose of Trulicity.  Glycemic data:   Forget to bring glucometer today, no glucose data to review.  Hypoglycemia: Patient has no hypoglycemic episodes. Patient has hypoglycemia awareness.  Factors modifying glucose control: 1.  Diabetic diet assessment:  3 meals a day.  Vegetarian diet.  2.  Staying active or exercising:   3.  Medication compliance: compliant all of the time.  # Primary hypothyroidism -Was diagnosed in 2018/2019 timeframe.  Has been on thyroid hormone replacement since the diagnosis.  Dose has been adjusted in the past.  Currently taking levothyroxine 50 mcg daily.  Interval history  Hemoglobin A1c 6.7%.  She was in [place] and recently came back to [place], did not take Trulicity for about 2 weeks.  Diabetes regimen as reviewed and noted above.  She [name] having dry cough, no sputum, no cold symptoms and fever for about 2 weeks.  No other complaints today.  She has been taking levothyroxine 50 mcg daily, no hypo and hyperthyroid symptoms.  Recent thyroid function test normal.  Recent lab results reviewed with normal renal function, electrolytes.  She [name] accompanied by son in the clinic today.  REVIEW OF SYSTEMS As per history of present illness.   PAST MEDICAL HISTORY: Past Medical History:  Diagnosis Date   Anemia    CAD (coronary artery disease)    a. 04/2013 Inf STEMI/PCI: LM nl, LAD min irregs, D1/2/3 nl, LCX 70m (2.5x18 Xience DES), OM1/2/3 nl, RCA nl, PDA nl, RPAV nl, RPL nl, EF 55%;  b. 04/2013 Echo: EF 55-60%, no rwma, mild MR, mildly dil LA.   Diabetes mellitus type 2, uncontrolled  a. 04/2013 HbA1c = 11.7.   GERD (gastroesophageal reflux disease)    Hyperlipidemia    Hypertension    Hypothyroidism    MI (myocardial infarction) (HCC)    2014 w/stent placement    PAST SURGICAL HISTORY: Past Surgical History:  Procedure Laterality Date   CESAREAN SECTION     LEFT HEART CATH  05-15-13   LEFT HEART CATHETERIZATION WITH CORONARY ANGIOGRAM N/A 05/15/2013   Procedure: LEFT HEART CATHETERIZATION WITH CORONARY ANGIOGRAM;  Surgeon: Iran Ouch, MD;  Location: [facility] CATH [facility];  Service: Cardiovascular;  Laterality: N/A;   PERCUTANEOUS STENT INTERVENTION  05/15/2013   Procedure: PERCUTANEOUS STENT INTERVENTION;   Surgeon: Iran Ouch, MD;  Location: [facility] CATH [facility];  Service: Cardiovascular;;    ALLERGIES: No Known Allergies  FAMILY HISTORY:  Family History  Problem Relation Age of Onset   Heart disease Mother    Diabetes Mother    Hypertension Mother    Heart disease Father    Diabetes Father    Hypertension Father    Diabetes Brother     SOCIAL HISTORY: Social History   Socioeconomic History   Marital status: Widowed    Spouse name: Not on file   Number of children: Not on file   Years of education: Not on file   Highest education level: Not on file  Occupational History   Not on file  Tobacco Use   Smoking status: Never   Smokeless tobacco: Never  Vaping Use   Vaping status: Never Used  Substance and Sexual Activity   Alcohol use: No   Drug use: No   Sexual activity: Not Currently  Other Topics Concern   Not on file  Social History Narrative   Not on file   Social Drivers of Health   Financial Resource Strain: Not on file  Food Insecurity: Not on file  Transportation Needs: Not on file  Physical Activity: Not on file  Stress: Not on file  Social Connections: Not on file    MEDICATIONS:  Current Outpatient Medications  Medication Sig Dispense Refill   acarbose (PRECOSE) 25 MG tablet Take 1 tablet at the start of breakfast 90 tablet 1   ACCU-CHEK GUIDE test strip USE TO CHECK BLOOD SUGAR 3 TIMES A DAY 300 each 2   Accu-Chek Softclix Lancets lancets USE TO CHECK BLOOD SUGAR 3 TIMES A DAY 300 each 1   atorvastatin (LIPITOR) 80 MG tablet TAKE 1 TABLET (80 MG TOTAL) BY MOUTH DAILY AT 6 PM. 30 tablet 10   Blood Glucose Monitoring Suppl (ACCU-CHEK GUIDE) w/Device KIT Use to check blood sugar 3 times a day. 1 kit 0   Dulaglutide (TRULICITY) 0.75 MG/0.5ML SOAJ INJECT 0.75 MG IN THE ABDOMINAL SKIN AS DIRECTED ONCE A WEEK 2 mL 5   empagliflozin (JARDIANCE) 25 MG TABS tablet TAKE 1 TABLET (25MG  TOTAL) BY MOUTH DAILY BEFORE BREAKFAST 90 tablet 2   gabapentin (NEURONTIN)  100 MG capsule Take 2 capsules (200 mg total) by mouth at bedtime. 180 capsule 0   levothyroxine (SYNTHROID) 50 MCG tablet Take 1 tablet (50 mcg total) by mouth daily. 90 tablet 3   lisinopril (ZESTRIL) 20 MG tablet TAKE 1 TABLET (20 MG TOTAL) BY MOUTH DAILY. 30 tablet 10   metFORMIN (GLUCOPHAGE-XR) 500 MG 24 hr tablet 1 tablet twice a day 180 tablet 1   metoprolol tartrate (LOPRESSOR) 25 MG tablet TAKE 1/2 TABLETS (12.5 MG TOTAL) BY MOUTH 2 (TWO) TIMES DAILY. 30 tablet 10   miconazole (MONISTAT 7  SIMPLY CURE) 2 % vaginal cream Place 1 Applicatorful vaginally at bedtime. 45 g 0   nitroGLYCERIN (NITROSTAT) 0.4 MG SL tablet Place 1 tablet (0.4 mg total) under the tongue every 5 (five) minutes as needed for chest pain. 25 tablet 0   ondansetron (ZOFRAN ODT) 4 MG disintegrating tablet Take 1 tablet (4 mg total) by mouth every 8 (eight) hours as needed for nausea or vomiting. 20 tablet 0   pantoprazole (PROTONIX) 40 MG tablet TAKE 1 TABLET (40 MG TOTAL) BY MOUTH DAILY. 90 tablet 3   No current facility-administered medications for this visit.    PHYSICAL EXAM: Vitals:   09/07/23 1116  BP: 132/62  Pulse: 78  Resp: 20  SpO2: 98%  Weight: 127 lb 12.8 oz (58 kg)  Height: 4\' 8"  (1.422 m)    Body mass index [name] 28.65 kg/m.  Wt Readings from Last 3 Encounters:  09/07/23 127 lb 12.8 oz (58 kg)  04/09/23 123 lb 3.2 oz (55.9 kg)  04/09/23 125 lb 3.2 oz (56.8 kg)    General: Well developed, well nourished female in no apparent distress.  HEENT: AT/[place], no external lesions.  Eyes: Conjunctiva clear and no icterus. Neck: Neck supple  Lungs: Respirations not labored Neurologic: Alert, oriented, normal speech Extremities / Skin: Dry.  Psychiatric: Does not appear depressed or anxious  Diabetic Foot Exam - Simple   Simple Foot Form Diabetic Foot exam was performed with the following findings: Yes 09/07/2023 11:30 AM  Visual Inspection See comments: Yes Sensation Testing Intact to touch and  monofilament testing bilaterally: Yes Pulse Check Posterior Tibialis and Dorsalis pulse intact bilaterally: Yes Comments Callus on left foot +    LABS Reviewed Lab Results  Component Value Date   HGBA1C 6.7 (H) 08/31/2023   HGBA1C 6.8 (H) 04/01/2023   HGBA1C 6.6 (H) 11/25/2022   Lab Results  Component Value Date   FRUCTOSAMINE 215 08/06/2021   FRUCTOSAMINE 224 01/30/2017   Lab Results  Component Value Date   CHOL 84 11/25/2022   HDL 39.00 (L) 11/25/2022   LDLCALC 30 11/25/2022   TRIG 74.0 11/25/2022   CHOLHDL 2 11/25/2022   Lab Results  Component Value Date   MICRALBCREAT 1.6 11/25/2022   MICRALBCREAT 1.1 11/25/2021   Lab Results  Component Value Date   CREATININE 0.75 08/31/2023   Lab Results  Component Value Date   GFR 80.25 04/01/2023    ASSESSMENT / PLAN  1. Controlled type 2 diabetes with neuropathy (HCC)   2. Acquired autoimmune hypothyroidism   3. Subacute cough      Diabetes Mellitus type 2, complicated by diabetic neuropathy/CAD. - Diabetic status / severity: Controlled.  Lab Results  Component Value Date   HGBA1C 6.7 (H) 08/31/2023    - Hemoglobin A1c goal : <7%  Had significant suppression of appetite on higher dose of Trulicity.  - Medications: No change. Metformin ER 500 mg 2 tablets daily.  Jardiance 25 mg daily Trulicity 0.75 mg weekly  Acarbose 25 mg daily with breakfast.  - Home glucose testing: In the morning fasting and bedtime.  Asked to bring glucometer in the follow-up visit. - Discussed/ Gave Hypoglycemia treatment plan.  # Consult : not required at this time.   # Annual urine for microalbuminuria/ creatinine ratio, no microalbuminuria currently, continue ACE/ARB /lisinopril. Last  Lab Results  Component Value Date   MICRALBCREAT 1.6 11/25/2022    # Foot check nightly / neuropathy, continue gabapentin 200 mg at bedtime.  # Annual dilated diabetic  eye exams.   - Diet: Make healthy diabetic food choices - Life  style / activity / exercise: Discussed.  2. Blood pressure  -  BP Readings from Last 1 Encounters:  09/07/23 132/62    - Control [name] in target.  - No change in current plans.  3. Lipid status / Hyperlipidemia - Last  Lab Results  Component Value Date   LDLCALC 30 11/25/2022   - Continue atorvastatin 80 mg daily.  # Primary hypothyroidism -Recent TSH/thyroid function test normal.  Continue current dose of levothyroxine 50 mcg daily.  # Cough -Patient has dry cough, no symptoms of upper respite tract infection.  Patient [name] advised to hold lisinopril for a week as a trial if cough resolved asked to call our clinic will stop lisinopril and start losartan. -Id she Continues to have cough, advised to use cough suppressant over-the-counter.  Diagnoses and all orders for this visit:  Controlled type 2 diabetes with neuropathy (HCC) -     Microalbumin / creatinine urine ratio -     Lipid panel -     BASIC METABOLIC PANEL WITH GFR -     Hemoglobin A1c  Acquired autoimmune hypothyroidism  Subacute cough    DISPOSITION Follow up in clinic in 4  months suggested.  Labs prior to follow-up visit as ordered.   All questions answered and patient verbalized understanding of the plan.  [name] Halimah Bewick, MD [facility] Endocrinology [facility] [address] [place], [address] [place], [place] [address] Phone # [phone]  At least part of this note was generated using voice recognition software. Inadvertent word errors may have occurred, which were not recognized during the proofreading process.

## 2023-09-07 NOTE — Patient Instructions (Signed)
 Cough Suppressant for dry cough.  Hold Lisinopril for 1 week see if cough resolves. Let me know if that help, will send alternative called losatan.

## 2023-09-23 ENCOUNTER — Other Ambulatory Visit: Payer: Self-pay | Admitting: Endocrinology

## 2023-09-23 DIAGNOSIS — E1140 Type 2 diabetes mellitus with diabetic neuropathy, unspecified: Secondary | ICD-10-CM

## 2023-10-28 ENCOUNTER — Other Ambulatory Visit: Payer: Self-pay | Admitting: Endocrinology

## 2023-10-28 DIAGNOSIS — E1140 Type 2 diabetes mellitus with diabetic neuropathy, unspecified: Secondary | ICD-10-CM

## 2024-01-15 ENCOUNTER — Other Ambulatory Visit

## 2024-01-22 ENCOUNTER — Ambulatory Visit: Admitting: Endocrinology

## 2024-01-29 DIAGNOSIS — E1140 Type 2 diabetes mellitus with diabetic neuropathy, unspecified: Secondary | ICD-10-CM | POA: Diagnosis not present

## 2024-01-29 DIAGNOSIS — E039 Hypothyroidism, unspecified: Secondary | ICD-10-CM | POA: Diagnosis not present

## 2024-01-29 DIAGNOSIS — E782 Mixed hyperlipidemia: Secondary | ICD-10-CM | POA: Diagnosis not present

## 2024-02-04 DIAGNOSIS — E1140 Type 2 diabetes mellitus with diabetic neuropathy, unspecified: Secondary | ICD-10-CM | POA: Diagnosis not present

## 2024-02-04 DIAGNOSIS — E039 Hypothyroidism, unspecified: Secondary | ICD-10-CM | POA: Diagnosis not present

## 2024-02-04 DIAGNOSIS — E782 Mixed hyperlipidemia: Secondary | ICD-10-CM | POA: Diagnosis not present

## 2024-02-10 DIAGNOSIS — D508 Other iron deficiency anemias: Secondary | ICD-10-CM | POA: Diagnosis not present

## 2024-02-10 DIAGNOSIS — D509 Iron deficiency anemia, unspecified: Secondary | ICD-10-CM | POA: Diagnosis not present

## 2024-02-23 ENCOUNTER — Other Ambulatory Visit: Payer: Self-pay | Admitting: Cardiology

## 2024-04-15 DIAGNOSIS — D509 Iron deficiency anemia, unspecified: Secondary | ICD-10-CM | POA: Diagnosis not present

## 2024-04-19 DIAGNOSIS — Z862 Personal history of diseases of the blood and blood-forming organs and certain disorders involving the immune mechanism: Secondary | ICD-10-CM | POA: Diagnosis not present

## 2024-04-19 DIAGNOSIS — K219 Gastro-esophageal reflux disease without esophagitis: Secondary | ICD-10-CM | POA: Diagnosis not present

## 2024-04-19 DIAGNOSIS — D509 Iron deficiency anemia, unspecified: Secondary | ICD-10-CM | POA: Diagnosis not present

## 2024-04-26 ENCOUNTER — Other Ambulatory Visit: Payer: Self-pay | Admitting: Cardiology

## 2024-06-06 DIAGNOSIS — E039 Hypothyroidism, unspecified: Secondary | ICD-10-CM | POA: Diagnosis not present

## 2024-06-06 DIAGNOSIS — E785 Hyperlipidemia, unspecified: Secondary | ICD-10-CM | POA: Diagnosis not present

## 2024-06-06 DIAGNOSIS — E1140 Type 2 diabetes mellitus with diabetic neuropathy, unspecified: Secondary | ICD-10-CM | POA: Diagnosis not present

## 2024-07-20 ENCOUNTER — Other Ambulatory Visit: Payer: Self-pay | Admitting: Cardiology
